# Patient Record
Sex: Male | Born: 1970 | Race: Black or African American | Hispanic: No | Marital: Single | Smoking: Former smoker
Health system: Southern US, Community
[De-identification: ages and names within clinical notes are randomized; demographics above are authoritative.]

## PROBLEM LIST (undated history)

## (undated) DIAGNOSIS — F32A Depression, unspecified: Secondary | ICD-10-CM

## (undated) DIAGNOSIS — F419 Anxiety disorder, unspecified: Secondary | ICD-10-CM

## (undated) DIAGNOSIS — E119 Type 2 diabetes mellitus without complications: Secondary | ICD-10-CM

## (undated) HISTORY — DX: Depression, unspecified: F32.A

## (undated) HISTORY — DX: Anxiety disorder, unspecified: F41.9

---

## 2000-04-21 ENCOUNTER — Emergency Department (HOSPITAL_COMMUNITY): Admission: EM | Admit: 2000-04-21 | Discharge: 2000-04-21 | Payer: Self-pay | Admitting: Emergency Medicine

## 2003-07-15 ENCOUNTER — Emergency Department (HOSPITAL_COMMUNITY): Admission: AD | Admit: 2003-07-15 | Discharge: 2003-07-15 | Payer: Self-pay | Admitting: Family Medicine

## 2018-01-25 ENCOUNTER — Encounter (HOSPITAL_BASED_OUTPATIENT_CLINIC_OR_DEPARTMENT_OTHER): Payer: Self-pay

## 2018-01-25 ENCOUNTER — Other Ambulatory Visit: Payer: Self-pay

## 2018-01-25 ENCOUNTER — Emergency Department (HOSPITAL_BASED_OUTPATIENT_CLINIC_OR_DEPARTMENT_OTHER)
Admission: EM | Admit: 2018-01-25 | Discharge: 2018-01-25 | Disposition: A | Discharge status: Home / Self Care | Payer: BLUE CROSS/BLUE SHIELD | Attending: Emergency Medicine | Admitting: Emergency Medicine

## 2018-01-25 DIAGNOSIS — E1165 Type 2 diabetes mellitus with hyperglycemia: Secondary | ICD-10-CM | POA: Insufficient documentation

## 2018-01-25 DIAGNOSIS — H538 Other visual disturbances: Secondary | ICD-10-CM | POA: Diagnosis not present

## 2018-01-25 DIAGNOSIS — F17200 Nicotine dependence, unspecified, uncomplicated: Secondary | ICD-10-CM | POA: Insufficient documentation

## 2018-01-25 DIAGNOSIS — R5383 Other fatigue: Secondary | ICD-10-CM | POA: Diagnosis not present

## 2018-01-25 DIAGNOSIS — R739 Hyperglycemia, unspecified: Secondary | ICD-10-CM

## 2018-01-25 DIAGNOSIS — I517 Cardiomegaly: Secondary | ICD-10-CM | POA: Diagnosis not present

## 2018-01-25 HISTORY — DX: Type 2 diabetes mellitus without complications: E11.9

## 2018-01-25 LAB — CBC WITH DIFFERENTIAL/PLATELET
BASOS PCT: 0 %
Basophils Absolute: 0.1 10*3/uL (ref 0.0–0.1)
EOS ABS: 0.6 10*3/uL (ref 0.0–0.7)
EOS PCT: 5 %
HCT: 40.3 % (ref 39.0–52.0)
Hemoglobin: 15.3 g/dL (ref 13.0–17.0)
LYMPHS ABS: 4 10*3/uL (ref 0.7–4.0)
Lymphocytes Relative: 34 %
MCH: 31.7 pg (ref 26.0–34.0)
MCHC: 38 g/dL — ABNORMAL HIGH (ref 30.0–36.0)
MCV: 83.6 fL (ref 78.0–100.0)
Monocytes Absolute: 1 10*3/uL (ref 0.1–1.0)
Monocytes Relative: 9 %
Neutro Abs: 6.1 10*3/uL (ref 1.7–7.7)
Neutrophils Relative %: 52 %
PLATELETS: 303 10*3/uL (ref 150–400)
RBC: 4.82 MIL/uL (ref 4.22–5.81)
RDW: 11.9 % (ref 11.5–15.5)
WBC: 11.7 10*3/uL — AB (ref 4.0–10.5)

## 2018-01-25 LAB — COMPREHENSIVE METABOLIC PANEL
ALT: 47 U/L (ref 17–63)
ANION GAP: 11 (ref 5–15)
AST: 28 U/L (ref 15–41)
Albumin: 4.4 g/dL (ref 3.5–5.0)
Alkaline Phosphatase: 149 U/L — ABNORMAL HIGH (ref 38–126)
BUN: 19 mg/dL (ref 6–20)
CHLORIDE: 92 mmol/L — AB (ref 101–111)
CO2: 23 mmol/L (ref 22–32)
Calcium: 9.2 mg/dL (ref 8.9–10.3)
Creatinine, Ser: 1.17 mg/dL (ref 0.61–1.24)
GFR calc non Af Amer: >60 mL/min (ref >60)
Glucose, Bld: 742 mg/dL (ref 65–99)
Potassium: 3.9 mmol/L (ref 3.5–5.1)
SODIUM: 126 mmol/L — AB (ref 135–145)
Total Bilirubin: 0.3 mg/dL (ref 0.3–1.2)
Total Protein: 7.7 g/dL (ref 6.5–8.1)

## 2018-01-25 LAB — CBG MONITORING, ED
GLUCOSE-CAPILLARY: 203 mg/dL — AB (ref 65–99)
Glucose-Capillary: 355 mg/dL — ABNORMAL HIGH (ref 65–99)

## 2018-01-25 MED ORDER — METFORMIN HCL 500 MG PO TABS: 500.0000 mg | ORAL_TABLET | Freq: Two times a day (BID) | ORAL | 0 refills | Status: DC

## 2018-01-25 MED ORDER — LIVING WELL WITH DIABETES BOOK
Status: AC
  Filled 2018-01-25: qty 1

## 2018-01-25 MED ORDER — SODIUM CHLORIDE 0.9 % IV BOLUS
1000.0000 mL | Freq: Once | INTRAVENOUS | Status: AC
  Administered 2018-01-25: 1000 mL via INTRAVENOUS

## 2018-01-25 MED ORDER — SODIUM CHLORIDE 0.9 % IV BOLUS: 1000.0000 mL | Freq: Once | INTRAVENOUS | Status: DC

## 2018-01-25 MED ORDER — INSULIN REGULAR HUMAN 100 UNIT/ML IJ SOLN
10.0000 [IU] | Freq: Once | INTRAMUSCULAR | Status: AC
  Administered 2018-01-25: 10 [IU] via INTRAVENOUS
  Filled 2018-01-25: qty 1

## 2018-01-25 NOTE — ED Notes (Signed)
ED Provider at bedside. 

## 2018-01-25 NOTE — ED Notes (Signed)
Date and time results received: 01/25/18 2030 (use smartphrase ".now" to insert current time)  Test: glucose Critical Value: 742  Name of Provider Notified: Dr. Jacqulyn Bath  Orders Received? Or Actions Taken?: awaiting new orders

## 2018-01-25 NOTE — ED Triage Notes (Addendum)
Pt c/o gradual vision loss for the last week, states that everything is blurry, drove self here, other complaints about chronic back pain and mouth pain, pt mentions chest pain at the end of triage

## 2018-01-25 NOTE — ED Provider Notes (Signed)
Emergency Department Provider Note   I have reviewed the triage vital signs and the nursing notes.   HISTORY  Chief Complaint Hyperglycemia   HPI Matthew Lynch is a 47 y.o. male with no known PMH presents to the emergency department for evaluation of fatigue, blurry vision, polyuria worsening over the past week.  The patient states that he feels worse when he eats sugary foods which he thought would give him energy.  He does not have a primary care physician and cannot recall the last time he saw one.  When he began to feel fatigue and blurry vision he stopped smoking.  Not take any medications.  He does have a strong family history of diabetes.  He denies any abdominal pain nausea, vomiting.   Past Medical History:  Diagnosis Date  . Diabetes mellitus without complication (HCC)     There are no active problems to display for this patient.   History reviewed. No pertinent surgical history.    Allergies Patient has no known allergies.  No family history on file.  Social History Social History   Tobacco Use  . Smoking status: Current Every Day Smoker  . Smokeless tobacco: Never Used  Substance Use Topics  . Alcohol use: Yes    Comment: occasion  . Drug use: Not on file    Review of Systems  Constitutional: No fever/chills. Positive fatigue.  Eyes: Positive blurry vision.  ENT: No sore throat. Cardiovascular: Denies chest pain. Respiratory: Denies shortness of breath. Gastrointestinal: No abdominal pain.  No nausea, no vomiting.  No diarrhea.  No constipation. Genitourinary: Negative for dysuria. Positive polyuria.  Musculoskeletal: Negative for back pain. Skin: Negative for rash. Neurological: Negative for headaches, focal weakness or numbness.  10-point ROS otherwise negative.  ____________________________________________   PHYSICAL EXAM:  VITAL SIGNS: ED Triage Vitals  Enc Vitals Group     BP 01/25/18 1847 128/78     Pulse Rate 01/25/18 1847 89      Resp 01/25/18 1847 18     Temp 01/25/18 1847 98.1 F (36.7 C)     Temp Source 01/25/18 1847 Oral     SpO2 01/25/18 1847 96 %     Weight 01/25/18 1845 125 lb (56.7 kg)     Height 01/25/18 1845  (1.651 m)     Pain Score 01/25/18 1845 0   Constitutional: Alert and oriented. Well appearing and in no acute distress. Eyes: Conjunctivae are normal. PERRL. EOMI. Head: Atraumatic. Nose: No congestion/rhinnorhea. Mouth/Throat: Mucous membranes are slightly dry. Oropharynx non-erythematous. Neck: No stridor.  Cardiovascular: Normal rate, regular rhythm. Good peripheral circulation. Grossly normal heart sounds.   Respiratory: Normal respiratory effort.  No retractions. Lungs CTAB. Gastrointestinal: Soft and nontender. No distention.  Musculoskeletal: No lower extremity tenderness nor edema. No gross deformities of extremities. Neurologic:  Normal speech and language. No gross focal neurologic deficits are appreciated.  Skin:  Skin is warm, dry and intact. No rash noted.  ____________________________________________   LABS (all labs ordered are listed, but only abnormal results are displayed)  Labs Reviewed  CBC WITH DIFFERENTIAL/PLATELET - Abnormal; Notable for the following components:      Result Value   WBC 11.7 (*)    MCHC 38.0 (*)    All other components within normal limits  COMPREHENSIVE METABOLIC PANEL - Abnormal; Notable for the following components:   Sodium 126 (*)    Chloride 92 (*)    Glucose, Bld 742 (*)    Alkaline Phosphatase 149 (*)  All other components within normal limits  CBG MONITORING, ED - Abnormal; Notable for the following components:   Glucose-Capillary >600 (*)    All other components within normal limits  CBG MONITORING, ED - Abnormal; Notable for the following components:   Glucose-Capillary 355 (*)    All other components within normal limits  CBG MONITORING, ED - Abnormal; Notable for the following components:   Glucose-Capillary 203 (*)      All other components within normal limits  CBG MONITORING, ED   ____________________________________________  EKG   EKG Interpretation  Date/Time:  Wednesday Jan 25 2018 18:52:35 EDT Ventricular Rate:  85 PR Interval:  166 QRS Duration: 96 QT Interval:  354 QTC Calculation: 421 R Axis:   77 Text Interpretation:  Normal sinus rhythm Right atrial enlargement RSR' or QR pattern in V1 suggests right ventricular conduction delay Borderline ECG No STEMI.  Confirmed by Alona Bene [phone]) on 01/25/2018 7:02:14 PM       ____________________________________________  RADIOLOGY  None ____________________________________________   PROCEDURES  Procedure(s) performed:   Procedures  None ____________________________________________   INITIAL IMPRESSION / ASSESSMENT AND PLAN / ED COURSE  Pertinent labs & imaging results that were available during my care of the patient were reviewed by me and considered in my medical decision making (see chart for details).  Patient presents to the emergency department for evaluation of gradual, bilateral blurry vision with weakness, and polyuria.  The patient's CBG here is greater than 600.  Lower suspicion for DKA but plan for labs, IV fluids and reassess.   No DKA on labs. Clinically no hyperosmolar syndrome. Patient observed in the ED with blood sugar decreasing significantly with insulin and IVF. I was able to schedule a family medicine appointment for the patient at 1:50 PM in one week. Renal function ok. Starting Metformin and discussed dietary changes. No focal neuro deficits. Patient agrees to see PCP as scheduled. Discussed short and long term risks if DM goes un managed. Discussed signs/symptoms of DKA that would require immediate return to the ED.   At this time, I do not feel there is any life-threatening condition present. I have reviewed and discussed all results (EKG, imaging, lab, urine as appropriate), exam findings with patient.  I have reviewed nursing notes and appropriate previous records.  I feel the patient is safe to be discharged home without further emergent workup. Discussed usual and customary return precautions. Patient and family (if present) verbalize understanding and are comfortable with this plan.  Patient will follow-up with their primary care provider. If they do not have a primary care provider, information for follow-up has been provided to them. All questions have been answered.    ____________________________________________  FINAL CLINICAL IMPRESSION(S) / ED DIAGNOSES  Final diagnoses:  Hyperglycemia  Blurry vision, bilateral     MEDICATIONS GIVEN DURING THIS VISIT:  Medications  sodium chloride 0.9 % bolus 1,000 mL (0 mLs Intravenous Stopped 01/25/18 2040)  sodium chloride 0.9 % bolus 1,000 mL (0 mLs Intravenous Stopped 01/25/18 2127)  insulin regular (NOVOLIN R,HUMULIN R) 100 units/mL injection 10 Units (10 Units Intravenous Given 01/25/18 2054)     NEW OUTPATIENT MEDICATIONS STARTED DURING THIS VISIT:  Discharge Medication List as of 01/25/2018 10:38 PM    START taking these medications   Details  metFORMIN (GLUCOPHAGE) 500 MG tablet Take 1 tablet (500 mg total) by mouth 2 (two) times daily with a meal., Starting Wed 01/25/2018, Until Fri 02/24/2018, Print  Note:  This document was prepared using Dragon voice recognition software and may include unintentional dictation errors.  Alona Bene, MD Emergency Medicine    Long, Arlyss Repress, MD 01/26/18 [phone]

## 2018-01-25 NOTE — Discharge Instructions (Signed)
You were seen in the ED today with blurry vision and were found to have diabetes. We are starting you on Metformin for this and have arranged for you to follow up as listed below. Return to the ED with any worsening vision, confusion, abdominal pain, or vomiting.

## 2018-01-25 NOTE — ED Notes (Signed)
Pt given PB crackers for protein before discharge

## 2018-02-01 ENCOUNTER — Ambulatory Visit (INDEPENDENT_AMBULATORY_CARE_PROVIDER_SITE_OTHER): Payer: BLUE CROSS/BLUE SHIELD | Admitting: Internal Medicine

## 2018-02-01 ENCOUNTER — Encounter: Payer: Self-pay | Admitting: Internal Medicine

## 2018-02-01 ENCOUNTER — Other Ambulatory Visit: Payer: Self-pay

## 2018-02-01 VITALS — BP 110/72 | HR 77 | Temp 98.2°F | Ht 65.0 in | Wt 132.0 lb

## 2018-02-01 DIAGNOSIS — Z7689 Persons encountering health services in other specified circumstances: Secondary | ICD-10-CM

## 2018-02-01 DIAGNOSIS — E119 Type 2 diabetes mellitus without complications: Secondary | ICD-10-CM

## 2018-02-01 DIAGNOSIS — R7309 Other abnormal glucose: Secondary | ICD-10-CM | POA: Diagnosis not present

## 2018-02-01 LAB — POCT GLYCOSYLATED HEMOGLOBIN (HGB A1C): HbA1c, POC (controlled diabetic range): 12.5 % — AB (ref 0.0–7.0)

## 2018-02-01 MED ORDER — MICROLET NEXT LANCING DEVICE MISC: 1.0000 | Freq: Two times a day (BID) | 0 refills | Status: DC

## 2018-02-01 MED ORDER — DULAGLUTIDE 0.75 MG/0.5ML ~~LOC~~ SOAJ: 0.7500 mg | SUBCUTANEOUS | 0 refills | Status: DC

## 2018-02-01 MED ORDER — MICROLET LANCETS MISC: 0 refills | Status: DC

## 2018-02-01 MED ORDER — METFORMIN HCL 1000 MG PO TABS: 1000.0000 mg | ORAL_TABLET | Freq: Two times a day (BID) | ORAL | 0 refills | Status: DC

## 2018-02-01 MED ORDER — BAYER CONTOUR MONITOR W/DEVICE KIT: PACK | 0 refills | Status: DC

## 2018-02-01 MED ORDER — GLUCOSE BLOOD VI STRP: ORAL_STRIP | 0 refills | Status: DC

## 2018-02-01 NOTE — Assessment & Plan Note (Addendum)
Poorly controlled based on Hb A1c = 12.5 today and ongoing fatigue, mild polyuria.  - Increased dose of metformin from 500 mg BID to 1000 mg BID - Started Trulicity 0.75 mg weekly given patient's very strong aversion to needles and daily injection of insulin - Ordered gluocometer, test strips, and lancets. Recommended checking blood sugar every morning; also recommended checking blood sugar before and after some meals to better understand how different foods impact blood glucose levels - Counseled on dietary changes that will help improve glycemic control, including reducing rice/bread/pasta intake and cutting out sugary beverages - Educated patient on what Hb A1c is and explained why long-term goal for Hb A1c < 7.0 will help protect him against complications of diabetes - Diabetic foot exam performed today and was normal - Placed ophthalmology referral and explained importance of having eye examination soon - Follow-up in 4 weeks. Will plan to increase Trulicity dose at that visit.

## 2018-02-01 NOTE — Patient Instructions (Addendum)
Thank you for coming to see Matthew Lynch today.  We started you on Trulicity, the medication that you will inject 1x per week. We sent this to your pharmacy. We are also increasing your metformin dose from 500 mg 2x per day to 1000 mg 2x per day. This should actually help with the constipation that you've been experiencing.  In addition we referred you for an eye doctor appointment given the vision changes that you have noticed.  Please schedule an appointment in 1 month to check in about how things are going and how you are doing with the new medication.  If you notice changes in your mental status (very confused, not acting like yourself) or start to have unexplained nausea/vomiting, please go to the emergency department because this may mean that your blood sugar is too high.

## 2018-02-01 NOTE — Progress Notes (Addendum)
Date of Visit: 02/01/2018   HPI:   [name] is a 47 y.o. here today for a new patient appointment to discuss his recently diagnosed type 2 diabetes mellitus. He is accompanied by his mom.  Type 2 Diabetes Mellitus - Seen in ED on 01/25/18 for fatigue, blurry vision, and polydipsia/polyuria; on admission blood glucose was 742 mg/dL - Notes that he has been feeling "really weak" after meals for the past 1.5 months; says that he feels like he wants to lie down for a nap after eating - Has also had frequent episodes of lightheadedness over past 1.5 months - For the past 1-2 years, notes that he feels weaker/like he is "losing strength" - Around 5/25, noted that his vision became very blurry to the point where he could only see 1-2 feet in front of him; at this point, he stopped smoking and made other lifestyle changes because the vision loss scared him; ultimately ended up going to the ED due to lack of resolution, and diagnosis of hyperglycemia 2/2 diabetes mellitus was made - Started on metformin 500 mg BID by ED physician; has been taking medication consistently since then and tolerating it well - Checking sugars at least 1x (usually 2-3x) per day since ED visit; morning numbers have been running 250-400. Patient has been using his mom's blood glucometer, test strips, and lancets. - Vision has improved significantly since ED visit but still endorses some blurriness of objects at a distance - Has had paresthesias extending from dorsal surface of toes 2-4 to mid foot on left side for last 2 years - Prior to ED visit was urinating every 2 hours and getting up 5+ times during night to urinate; now wakes up about 2x per night to urinate - Has previously eaten diet heavy in rice, pasta, and bread; has been more mindful of choosing foods lower in carbohydrates by looking at nutritional labels - Likes sweet beverages like [place] fruit punch and orange juice and drinks these beverages regularly -  Main form of physical activity is chasing around kids - Sister diagnosed with type 1 diabetes mellitus as adolescent   Dental infection - On a 7-day course of amoxicillin QID since 01/26/18 for tooth infection by dentist, Dr. Rhodia AlbrightJon Byrd (Phone: [phone]) - Dentist has recommended tooth extraction but uncomfortable completing this procedure without medical clearance due to patient's high blood sugar  ROS: See HPI.  PMH: none  PSH: none  Family History: none  Allergies: none  Medications: None  PHYSICAL EXAM: BP 110/72   Pulse 77   Temp 98.2 F (36.8 C) (Oral)   Ht 5\' 5"  (1.651 m)   Wt 132 lb (59.9 kg)   SpO2 99%   BMI 21.97 kg/m  Gen: Well appearing, sitting comfortably in chair, in no acute distress Neuro: alert and oriented to person, place, and time; sensation to touch and monofilament intact in feet bilaterally Ext: atraumatic, without cyanosis or edema  ASSESSMENT/PLAN:  Diabetes mellitus (HCC) Poorly controlled based on Hb A1c = 12.5 today and ongoing fatigue, mild polyuria.  - Increased dose of metformin from 500 mg BID to 1000 mg BID - Started Trulicity 0.75 mg weekly given patient's very strong aversion to needles and daily injection of insulin - Ordered gluocometer, test strips, and lancets. Recommended checking blood sugar every morning; also recommended checking blood sugar before and after some meals to better understand how different foods impact blood glucose levels - Counseled on dietary changes that will help improve glycemic control,  including reducing rice/bread/pasta intake and cutting out sugary beverages - Educated patient on what Hb A1c is and explained why long-term goal for Hb A1c < 7.0 will help protect him against complications of diabetes - Diabetic foot exam performed today and was normal - Placed ophthalmology referral and explained importance of having eye examination soon - Follow-up in 4 weeks. Will plan to increase Trulicity dose at that  visit.  Tooth infection - Called dentist office and asked them to fax medical clearance form for patient; will complete this once received  Health maintenance:  - Outstanding health maintenance items include:  - Pneumococcal polysaccharide vaccine  - Urine microalbumin  - HIV screening  - Tetanus/Tdap  FOLLOW UP: Follow up in 4 weeks to check in about diabetes.  Gracy Bruins, MS3 [facility]  I personally evaluated this patient along with the student, and verified all aspects of the history, physical exam, and medical decision making as documented by the student. I agree with the student's documentation and have made all necessary edits.  Willadean Carol, MD PGY-3

## 2018-02-01 NOTE — Progress Notes (Deleted)
Retta DionesJon B Byrd, DDS  [phone]  Tooth extraction clearance

## 2018-02-03 ENCOUNTER — Other Ambulatory Visit: Payer: Self-pay | Admitting: Internal Medicine

## 2018-02-03 ENCOUNTER — Telehealth: Payer: Self-pay | Admitting: *Deleted

## 2018-02-03 ENCOUNTER — Encounter: Payer: Self-pay | Admitting: Internal Medicine

## 2018-02-03 MED ORDER — ONETOUCH DELICA LANCING DEV MISC: 0 refills | Status: DC

## 2018-02-03 MED ORDER — ONETOUCH VERIO W/DEVICE KIT: 1.0000 | PACK | Freq: Every day | 0 refills | Status: DC

## 2018-02-03 MED ORDER — GLUCOSE BLOOD VI STRP: ORAL_STRIP | 12 refills | Status: DC

## 2018-02-03 MED ORDER — ONETOUCH DELICA LANCETS 33G MISC: 0 refills | Status: DC

## 2018-02-03 NOTE — Telephone Encounter (Signed)
New prescriptions sent.

## 2018-02-03 NOTE — Addendum Note (Signed)
Addended by: Willadean CarolMAYO, Caila Cirelli D on: 02/03/2018 10:43 AM   Modules accepted: Level of Service

## 2018-02-03 NOTE — Telephone Encounter (Signed)
Pharmacy faxed a request for either the onetouch or freestyle meter for this patient.  The contour was not on his formulary.  Jazmin Hartsell,CMA

## 2018-02-07 ENCOUNTER — Telehealth: Payer: Self-pay

## 2018-02-08 NOTE — Telephone Encounter (Signed)
Patient left message about glucometer and supplies needing to be changed. Spoke with pharmacist and correct prescriptions sent but [employer] has a copay. Both options same price. Spoke to patient and explained. He will pick up the meter and supplies. Ples SpecterAlisa Brake, RN [facility])

## 2018-02-09 ENCOUNTER — Telehealth: Payer: Self-pay

## 2018-02-09 NOTE — Telephone Encounter (Signed)
Matthew JoinerRebecca With Dr Lubertha BasqueJohn Byrd (dental office) Calling to get status of medical clearance form faxed on 06/06. Please call [phone]. Sunday SpillersSharon T Reni Hausner, CMA

## 2018-02-10 NOTE — Telephone Encounter (Signed)
Patient needs an appointment for medical clearance for dental form. Please have patient schedule with Dr. Nancy MarusMayo.   Marcy Sirenatherine Elgie Maziarz, D.O. 02/10/2018, 11:17 AM PGY-3, [facility]

## 2018-02-13 NOTE — Telephone Encounter (Signed)
Tried to call pt. Mailbox is full. Could not leave a message. If pt calls, please schedule him an appt for medical clearance for dental form. Sunday SpillersSharon T Saunders, CMA

## 2018-06-16 ENCOUNTER — Other Ambulatory Visit: Payer: Self-pay

## 2018-06-16 MED ORDER — METFORMIN HCL 1000 MG PO TABS: 1000.0000 mg | ORAL_TABLET | Freq: Two times a day (BID) | ORAL | 0 refills | Status: DC

## 2018-06-26 ENCOUNTER — Ambulatory Visit (INDEPENDENT_AMBULATORY_CARE_PROVIDER_SITE_OTHER): Payer: BLUE CROSS/BLUE SHIELD | Admitting: Family Medicine

## 2018-06-26 ENCOUNTER — Other Ambulatory Visit: Payer: Self-pay

## 2018-06-26 ENCOUNTER — Encounter: Payer: Self-pay | Admitting: Family Medicine

## 2018-06-26 VITALS — BP 110/64 | HR 96 | Temp 98.4°F | Ht 65.0 in | Wt 130.0 lb

## 2018-06-26 DIAGNOSIS — E1169 Type 2 diabetes mellitus with other specified complication: Secondary | ICD-10-CM

## 2018-06-26 LAB — GLUCOSE, POCT (MANUAL RESULT ENTRY): POC GLUCOSE: 120 mg/dL — AB (ref 70–99)

## 2018-06-26 LAB — POCT GLYCOSYLATED HEMOGLOBIN (HGB A1C): HBA1C, POC (CONTROLLED DIABETIC RANGE): 6.5 % (ref 0.0–7.0)

## 2018-06-26 MED ORDER — METFORMIN HCL 1000 MG PO TABS: 1000.0000 mg | ORAL_TABLET | Freq: Two times a day (BID) | ORAL | 0 refills | Status: DC

## 2018-06-26 NOTE — Progress Notes (Signed)
   Subjective:    Patient ID: Matthew Lynch, male    DOB: 1971-06-10, 47 y.o.   MRN: [id number]   CC: Type 2 diabetes follow up   HPI: Patient is a 47 yo male with a past medical history significant for T2DM who present today to follow up on diabetes management. Patient was diagnosed in June 2019 with a A1c of 12.5. Patient was then started on Metformin 2000 mg a day and Trulicity 0.75 mg. Patient reports he never started Trulicity and was taking metformin 2000 mg until two weeks ago when he ran out and stopped taking it. He reports that his fasting BG have been ranging in the 190-200's. Patient denies any polyuria or polydipsia. He has been making some dietary changes and has cut down on sweets.   Smoking status reviewed   ROS: all other systems were reviewed and are negative other than in the HPI   Past Medical History:  Diagnosis Date  . Diabetes mellitus without complication (HCC)     History reviewed. No pertinent surgical history.  Past medical history, surgical, family, and social history reviewed and updated in the EMR as appropriate.  Objective:  BP 110/64   Pulse 96   Temp 98.4 F (36.9 C) (Oral)   Ht 5\' 5"  (1.651 m)   Wt 130 lb (59 kg)   SpO2 99%   BMI 21.63 kg/m   Vitals and nursing note reviewed  General: NAD, pleasant, able to participate in exam Cardiac: RRR, normal heart sounds, no murmurs. 2+ radial and PT pulses bilaterally Respiratory: CTAB, normal effort, No wheezes, rales or rhonchi Abdomen: soft, nontender, nondistended, no hepatic or splenomegaly, +BS Extremities: no edema or cyanosis. WWP. Skin: warm and dry, no rashes noted Neuro: alert and oriented x4, no focal deficits Psych: Normal affect and mood   Assessment & Plan:   Diabetes mellitus (HCC) A1c today is 6.5 down from 12.5 back in June 2019. Patient has only been adherent to metformin and did not take trulicity. Given significant improvement in glycemic control, will encourage patient to  continue with Metformin 2000 mg daily and discontinue trulicity. I will refer him to Diabetic Education and have him come back and see me in 2 months for A1c Check. He will continue to work on diet and exercise.     Lovena Neighbours, MD [facility] PGY-3

## 2018-06-26 NOTE — Patient Instructions (Signed)
It was great seeing you today! We have addressed the following issues today  1. Continue with Metformin 1000 mg two times a day. You can stop the injectable medication. Check your blood glucose in the morning before eating. Your goal blood glucose should be around 120. 2. I referred to Diabetic education, I think you will benefit from that session greatly. 3. Make an appointment to see me in two months for another A1c check and additional blood work.  If we did any lab work today, and the results require attention, either me or my nurse will get in touch with you. If everything is normal, you will get a letter in mail and a message via . If you don't hear from [place] in two weeks, please give [place] a call. Otherwise, we look forward to seeing you again at your next visit. If you have any questions or concerns before then, please call the clinic at [phone].  Please bring all your medications to every doctors visit  Sign up for My Chart to have easy access to your labs results, and communication with your Primary care physician. Please ask Front Desk for some assistance.   Please check-out at the front desk before leaving the clinic.    Take Care,   Dr. Sydnee Cabal

## 2018-06-26 NOTE — Assessment & Plan Note (Addendum)
A1c today is 6.5 down from 12.5 back in June 2019. Patient has only been adherent to metformin and did not take trulicity. Given significant improvement in glycemic control, will encourage patient to continue with Metformin 2000 mg daily and discontinue trulicity. I will refer him to Diabetic Education and have him come back and see me in 2 months for A1c Check. He will continue to work on diet and exercise. 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

## 2018-08-24 ENCOUNTER — Other Ambulatory Visit: Payer: Self-pay

## 2018-08-24 ENCOUNTER — Ambulatory Visit (INDEPENDENT_AMBULATORY_CARE_PROVIDER_SITE_OTHER): Payer: BLUE CROSS/BLUE SHIELD | Admitting: Family Medicine

## 2018-08-24 ENCOUNTER — Encounter: Payer: Self-pay | Admitting: Family Medicine

## 2018-08-24 VITALS — BP 112/58 | HR 97 | Temp 98.7°F | Ht 65.0 in | Wt 132.0 lb

## 2018-08-24 DIAGNOSIS — M545 Low back pain: Secondary | ICD-10-CM | POA: Diagnosis not present

## 2018-08-24 DIAGNOSIS — G8929 Other chronic pain: Secondary | ICD-10-CM | POA: Diagnosis not present

## 2018-08-24 DIAGNOSIS — E1169 Type 2 diabetes mellitus with other specified complication: Secondary | ICD-10-CM | POA: Diagnosis not present

## 2018-08-24 LAB — POCT GLYCOSYLATED HEMOGLOBIN (HGB A1C): HbA1c, POC (controlled diabetic range): 6.6 % (ref 0.0–7.0)

## 2018-08-24 MED ORDER — IBUPROFEN 600 MG PO TABS: 600.0000 mg | ORAL_TABLET | Freq: Four times a day (QID) | ORAL | 0 refills | Status: DC | PRN

## 2018-08-24 NOTE — Assessment & Plan Note (Signed)
Low back appears to be chronic in nature and related to his job at [name]. Exam is unremarkable, no red flags such as radiculopathy or prior trauma or injury. Likely muscle strain. Will prescribe ibuprofen to be use on prn basis in combination with heat.

## 2018-08-24 NOTE — Patient Instructions (Signed)
It was great seeing you today! We have addressed the following issues today  1. Decrease your metformin to 500 mg two times a day and continue working on your diet and exercise plan.  2. I will check your cholesterol and kidney function. 3. You can take some ibuprofen for your low back pain in addition to heat.  4. Make an appointment to see me in two months.    If we did any lab work today, and the results require attention, either me or my nurse will get in touch with you. If everything is normal, you will get a letter in mail and a message via . If you don't hear from us in two weeks, please give us a call. Otherwise, we look forward to seeing you again at your next visit. If you have any questions or concerns before then, please call the clinic at [phone].  Please bring all your medications to every doctors visit  Sign up for My Chart to have easy access to your labs results, and communication with your Primary care physician. Please ask Front Desk for some assistance.   Please check-out at the front desk before leaving the clinic.    Take Care,   Dr. Sydnee Cabaliallo

## 2018-08-24 NOTE — Progress Notes (Addendum)
   Subjective:    Patient ID: Matthew Lynch, male    DOB: [birth year], 47 y.o.   MRN: [id number]   CC: Follow up for diabetes   HPI: Patient is a 47 yo male who presents today to follow up on diabetes management. Patient reports that he has not been taking his metformin as discussed at last office visit. He states he does not want his body to be dependent on the medication. He has change his diet and has been eating smaller meals and continue to be active with his work at a [name]. He denies any polyuria and polydipsia. Patient reports intermittent lower back pain for which he uses heat but has not taken any medication for it. He is otherwise feeling well with no acute complaints today. He currently denies any chest pain, SOB, abdominal pain, nausea, vomiting, headaches  Smoking status reviewed   ROS: all other systems were reviewed and are negative other than in the HPI   Past Medical History:  Diagnosis Date  . Diabetes mellitus without complication (HCC)     History reviewed. No pertinent surgical history.  Past medical history, surgical, family, and social history reviewed and updated in the EMR as appropriate.  Objective:  BP (!) 112/58   Pulse 97   Temp 98.7 F (37.1 C) (Oral)   Ht 5\' 5"  (1.651 m)   Wt 132 lb (59.9 kg)   SpO2 98%   BMI 21.97 kg/m   Vitals and nursing note reviewed  General: NAD, pleasant, able to participate in exam Cardiac: RRR, normal heart sounds, no murmurs. 2+ radial and PT pulses bilaterally Respiratory: CTAB, normal effort, No wheezes, rales or rhonchi Abdomen: soft, nontender, nondistended, no hepatic or splenomegaly, +BS Extremities: no edema or cyanosis. WWP. Skin: warm and dry, no rashes noted Neuro: alert and oriented x4, no focal deficits Psych: Normal affect and mood   Assessment & Plan:   Diabetes mellitus (HCC) A1c today is 6.6 despite poor adherence to regimen. Patient had an A1c of 6.5 after his diagnosis back in May  2019 when his A1c was then 12.5. Given maitenance in glycemic control, patient's age, comorbidities and reluctance to take medications, decrease dose from 1000 mg bid to 500 mg bid. Patient will follow up in 2 months if still around prediabetic range could adjust meds further. --Order BMP, lipid panel  Chronic bilateral low back pain without sciatica Low back appears to be chronic in nature and related to his job at [name]. Exam is unremarkable, no red flags such as radiculopathy or prior trauma or injury. Likely muscle strain. Will prescribe ibuprofen to be use on prn basis in combination with heat.     Matthew NeighboursAbdoulaye Cydnie Deason, MD [facility] PGY-3

## 2018-08-24 NOTE — Assessment & Plan Note (Addendum)
A1c today is 6.6 despite poor adherence to regimen. Patient had an A1c of 6.5 after his diagnosis back in May 2019 when his A1c was then 12.5. Given maitenance in glycemic control, patient's age, comorbidities and reluctance to take medications, decrease dose from 1000 mg bid to 500 mg bid. Patient will follow up in 2 months if still around prediabetic range could adjust meds further. --Order BMP, lipid panel

## 2018-08-25 LAB — BASIC METABOLIC PANEL
BUN / CREAT RATIO: 14 (ref 9–20)
BUN: 13 mg/dL (ref 6–24)
CO2: 24 mmol/L (ref 20–29)
CREATININE: 0.93 mg/dL (ref 0.76–1.27)
Calcium: 10.4 mg/dL — ABNORMAL HIGH (ref 8.7–10.2)
Chloride: 99 mmol/L (ref 96–106)
GFR, EST AFRICAN AMERICAN: 114 mL/min/{1.73_m2} (ref >59)
GFR, EST NON AFRICAN AMERICAN: 99 mL/min/{1.73_m2} (ref >59)
Glucose: 140 mg/dL — ABNORMAL HIGH (ref 65–99)
Potassium: 4.9 mmol/L (ref 3.5–5.2)
SODIUM: 139 mmol/L (ref 134–144)

## 2018-08-25 LAB — LIPID PANEL
CHOLESTEROL TOTAL: 208 mg/dL — AB (ref 100–199)
Chol/HDL Ratio: 5 ratio (ref 0.0–5.0)
HDL: 42 mg/dL (ref >39)
LDL Calculated: 132 mg/dL — ABNORMAL HIGH (ref 0–99)
Triglycerides: 171 mg/dL — ABNORMAL HIGH (ref 0–149)
VLDL Cholesterol Cal: 34 mg/dL (ref 5–40)

## 2018-10-26 ENCOUNTER — Ambulatory Visit: Payer: BLUE CROSS/BLUE SHIELD | Admitting: Family Medicine

## 2019-01-25 ENCOUNTER — Other Ambulatory Visit: Payer: Self-pay

## 2019-01-25 ENCOUNTER — Ambulatory Visit: Payer: BLUE CROSS/BLUE SHIELD | Admitting: Family Medicine

## 2019-01-25 ENCOUNTER — Encounter: Payer: Self-pay | Admitting: Family Medicine

## 2019-01-25 VITALS — BP 100/60 | HR 97 | Ht 65.0 in | Wt 124.1 lb

## 2019-01-25 DIAGNOSIS — E785 Hyperlipidemia, unspecified: Secondary | ICD-10-CM | POA: Diagnosis not present

## 2019-01-25 DIAGNOSIS — E1169 Type 2 diabetes mellitus with other specified complication: Secondary | ICD-10-CM | POA: Diagnosis not present

## 2019-01-25 LAB — POCT GLYCOSYLATED HEMOGLOBIN (HGB A1C): HbA1c, POC (controlled diabetic range): 9.7 % — AB (ref 0.0–7.0)

## 2019-01-25 MED ORDER — ROSUVASTATIN CALCIUM 20 MG PO TABS: 20.0000 mg | ORAL_TABLET | Freq: Every day | ORAL | 3 refills | Status: DC

## 2019-01-25 NOTE — Assessment & Plan Note (Signed)
Patient is diabetic and was not previously on any statin therapy.  Lipid panel from last office visit showed a total cholesterol 208, HDL 42, LDL 138.  We will start patient on Crestor 20 mg daily.

## 2019-01-25 NOTE — Progress Notes (Signed)
   Subjective:    Patient ID: Matthew Lynch, male    DOB: 03-31-1971, 48 y.o.   MRN: [id number]   CC: Type 2 diabetes follow-up  HPI: Patient is a 48 year old male who past medical history significant for hyperlipidemia, type 2 diabetes who presents today following a recent type 2 diabetes management.  Patient reports that he has been taking his metformin as needed and has not been consistent at all.  Fasting blood glucose in the morning has been ranging between 140 and 170.  Denies any polyuria polydipsia.  In the past few weeks he has been normal active and reports feeling better than he was 3 months ago.  Is otherwise asymptomatic with no acute complaint today.  He denies any chest pain, abdominal pain, shortness of breath, nausea, vomiting, headache or dizziness.  Smoking status reviewed   ROS: all other systems were reviewed and are negative other than in the HPI   Past Medical History:  Diagnosis Date  . Diabetes mellitus without complication (HCC)     History reviewed. No pertinent surgical history.  Past medical history, surgical, family, and social history reviewed and updated in the EMR as appropriate.  Objective:  BP 100/60   Pulse 97   Ht 5\' 5"  (1.651 m)   Wt 124 lb 2 oz (56.3 kg)   SpO2 99%   BMI 20.66 kg/m   Vitals and nursing note reviewed  General: NAD, pleasant, able to participate in exam Cardiac: RRR, normal heart sounds, no murmurs. 2+ radial and PT pulses bilaterally Respiratory: CTAB, normal effort, No wheezes, rales or rhonchi Abdomen: soft, nontender, nondistended, no hepatic or splenomegaly, +BS Extremities: no edema or cyanosis. WWP. Skin: warm and dry, no rashes noted Neuro: alert and oriented x4, no focal deficits Psych: Normal affect and mood   Assessment & Plan:   Diabetes mellitus (HCC) A1c today 7.7 up from 6.6.  Patient has been nonadherent to metformin.  Patient was supposed to be on 500 mg twice daily.  Worsening A1c consistent with  poor management.  Discussed need to be more consistent with therapy.  Patient will continue to check blood glucose and is aware his fasting blood glucose in the morning goal is 120 or less. --We will increase metformin to 1000 mg twice daily --Patient will consider cholesterol changes --We will start patient on statin therapy --Referral to diabetic education placed  Hyperlipidemia associated with type 2 diabetes mellitus (HCC) Patient is diabetic and was not previously on any statin therapy.  Lipid panel from last office visit showed a total cholesterol 208, HDL 42, LDL 138.  We will start patient on Crestor 20 mg daily.   Lovena Neighbours, MD [facility] PGY-3

## 2019-01-25 NOTE — Patient Instructions (Addendum)
It was great seeing you today! We have addressed the following issues today  1. Your A1c is 9.7 much worse than last time it was done a few months ago. This indicate a worsening of your Diabetes. You will need to be consistent with the medication as discussed. 2. I am starting you on a cholesterol medication named Crestor you will take it  every day. 3. I will refer you to diabetic education today.  If we did any lab work today, and the results require attention, either me or my nurse will get in touch with you. If everything is normal, you will get a letter in mail and a message via . If you don't hear from [place] in two weeks, please give [place] a call. Otherwise, we look forward to seeing you again at your next visit. If you have any questions or concerns before then, please call the clinic at [phone].  Please bring all your medications to every doctors visit  Sign up for My Chart to have easy access to your labs results, and communication with your Primary care physician. Please ask Front Desk for some assistance.   Please check-out at the front desk before leaving the clinic.    Take Care,   Dr. Sydnee Cabal

## 2019-01-25 NOTE — Assessment & Plan Note (Signed)
A1c today 7.7 up from 6.6.  Patient has been nonadherent to metformin.  Patient was supposed to be on 500 mg twice daily.  Worsening A1c consistent with poor management.  Discussed need to be more consistent with therapy.  Patient will continue to check blood glucose and is aware his fasting blood glucose in the morning goal is 120 or less. --We will increase metformin to 1000 mg twice daily --Patient will consider cholesterol changes --We will start patient on statin therapy --Referral to diabetic education placed

## 2019-03-13 ENCOUNTER — Ambulatory Visit: Payer: BLUE CROSS/BLUE SHIELD | Admitting: Registered"

## 2019-03-15 DIAGNOSIS — R197 Diarrhea, unspecified: Secondary | ICD-10-CM | POA: Diagnosis not present

## 2019-03-15 DIAGNOSIS — R109 Unspecified abdominal pain: Secondary | ICD-10-CM | POA: Diagnosis not present

## 2019-03-15 DIAGNOSIS — Z20828 Contact with and (suspected) exposure to other viral communicable diseases: Secondary | ICD-10-CM | POA: Diagnosis not present

## 2019-05-09 DIAGNOSIS — Z20828 Contact with and (suspected) exposure to other viral communicable diseases: Secondary | ICD-10-CM | POA: Diagnosis not present

## 2019-05-09 DIAGNOSIS — A084 Viral intestinal infection, unspecified: Secondary | ICD-10-CM | POA: Diagnosis not present

## 2019-08-02 ENCOUNTER — Other Ambulatory Visit: Payer: Self-pay | Admitting: *Deleted

## 2019-08-02 MED ORDER — METFORMIN HCL 1000 MG PO TABS: 1000.0000 mg | ORAL_TABLET | Freq: Two times a day (BID) | ORAL | 0 refills | Status: DC

## 2019-08-02 NOTE — Telephone Encounter (Signed)
Patient has an upcoming appt on 08/21/2019. Corbin Falck,CMA

## 2019-08-21 ENCOUNTER — Ambulatory Visit: Payer: BC Managed Care – PPO | Admitting: Family Medicine

## 2020-01-16 ENCOUNTER — Emergency Department (HOSPITAL_BASED_OUTPATIENT_CLINIC_OR_DEPARTMENT_OTHER): Payer: BC Managed Care – PPO

## 2020-01-16 ENCOUNTER — Emergency Department (HOSPITAL_BASED_OUTPATIENT_CLINIC_OR_DEPARTMENT_OTHER)
Admission: EM | Admit: 2020-01-16 | Discharge: 2020-01-16 | Disposition: A | Discharge status: Home / Self Care | Payer: BC Managed Care – PPO | Attending: Emergency Medicine | Admitting: Emergency Medicine

## 2020-01-16 ENCOUNTER — Other Ambulatory Visit: Payer: Self-pay

## 2020-01-16 ENCOUNTER — Encounter (HOSPITAL_BASED_OUTPATIENT_CLINIC_OR_DEPARTMENT_OTHER): Payer: Self-pay

## 2020-01-16 DIAGNOSIS — R112 Nausea with vomiting, unspecified: Secondary | ICD-10-CM | POA: Diagnosis not present

## 2020-01-16 DIAGNOSIS — R739 Hyperglycemia, unspecified: Secondary | ICD-10-CM

## 2020-01-16 DIAGNOSIS — R1084 Generalized abdominal pain: Secondary | ICD-10-CM | POA: Diagnosis not present

## 2020-01-16 DIAGNOSIS — E1165 Type 2 diabetes mellitus with hyperglycemia: Secondary | ICD-10-CM | POA: Diagnosis not present

## 2020-01-16 DIAGNOSIS — R197 Diarrhea, unspecified: Secondary | ICD-10-CM | POA: Diagnosis not present

## 2020-01-16 DIAGNOSIS — R109 Unspecified abdominal pain: Secondary | ICD-10-CM | POA: Diagnosis not present

## 2020-01-16 DIAGNOSIS — F17210 Nicotine dependence, cigarettes, uncomplicated: Secondary | ICD-10-CM | POA: Diagnosis not present

## 2020-01-16 DIAGNOSIS — R079 Chest pain, unspecified: Secondary | ICD-10-CM | POA: Diagnosis not present

## 2020-01-16 DIAGNOSIS — Z7984 Long term (current) use of oral hypoglycemic drugs: Secondary | ICD-10-CM | POA: Diagnosis not present

## 2020-01-16 LAB — URINALYSIS, ROUTINE W REFLEX MICROSCOPIC
Bilirubin Urine: NEGATIVE
Glucose, UA: >=500 mg/dL — AB
Hgb urine dipstick: NEGATIVE
Ketones, ur: 40 mg/dL — AB
Leukocytes,Ua: NEGATIVE
Nitrite: NEGATIVE
Protein, ur: NEGATIVE mg/dL
Specific Gravity, Urine: 1.015 (ref 1.005–1.030)
pH: 6 (ref 5.0–8.0)

## 2020-01-16 LAB — COMPREHENSIVE METABOLIC PANEL
ALT: 52 U/L — ABNORMAL HIGH (ref 0–44)
AST: 28 U/L (ref 15–41)
Albumin: 4.8 g/dL (ref 3.5–5.0)
Alkaline Phosphatase: 106 U/L (ref 38–126)
Anion gap: 12 (ref 5–15)
BUN: 18 mg/dL (ref 6–20)
CO2: 27 mmol/L (ref 22–32)
Calcium: 9.5 mg/dL (ref 8.9–10.3)
Chloride: 94 mmol/L — ABNORMAL LOW (ref 98–111)
Creatinine, Ser: 0.77 mg/dL (ref 0.61–1.24)
GFR calc Af Amer: >60 mL/min (ref >60)
GFR calc non Af Amer: >60 mL/min (ref >60)
Glucose, Bld: 365 mg/dL — ABNORMAL HIGH (ref 70–99)
Potassium: 4.4 mmol/L (ref 3.5–5.1)
Sodium: 133 mmol/L — ABNORMAL LOW (ref 135–145)
Total Bilirubin: 1.3 mg/dL — ABNORMAL HIGH (ref 0.3–1.2)
Total Protein: 8.5 g/dL — ABNORMAL HIGH (ref 6.5–8.1)

## 2020-01-16 LAB — LIPASE, BLOOD: Lipase: 33 U/L (ref 11–51)

## 2020-01-16 LAB — CBC
HCT: 49.8 % (ref 39.0–52.0)
Hemoglobin: 17.4 g/dL — ABNORMAL HIGH (ref 13.0–17.0)
MCH: 30.2 pg (ref 26.0–34.0)
MCHC: 34.9 g/dL (ref 30.0–36.0)
MCV: 86.5 fL (ref 80.0–100.0)
Platelets: 361 10*3/uL (ref 150–400)
RBC: 5.76 MIL/uL (ref 4.22–5.81)
RDW: 11.9 % (ref 11.5–15.5)
WBC: 14.9 10*3/uL — ABNORMAL HIGH (ref 4.0–10.5)
nRBC: 0 % (ref 0.0–0.2)

## 2020-01-16 LAB — URINALYSIS, MICROSCOPIC (REFLEX): WBC, UA: NONE SEEN WBC/hpf (ref 0–5)

## 2020-01-16 LAB — CBG MONITORING, ED: Glucose-Capillary: 352 mg/dL — ABNORMAL HIGH (ref 70–99)

## 2020-01-16 MED ORDER — SODIUM CHLORIDE 0.9 % IV BOLUS
1000.0000 mL | Freq: Once | INTRAVENOUS | Status: AC
  Administered 2020-01-16: 1000 mL via INTRAVENOUS

## 2020-01-16 MED ORDER — ONDANSETRON HCL 4 MG/2ML IJ SOLN
4.0000 mg | Freq: Once | INTRAMUSCULAR | Status: AC
  Administered 2020-01-16: 4 mg via INTRAVENOUS
  Filled 2020-01-16: qty 2

## 2020-01-16 MED ORDER — MORPHINE SULFATE (PF) 4 MG/ML IV SOLN
4.0000 mg | Freq: Once | INTRAVENOUS | Status: AC
  Administered 2020-01-16: 4 mg via INTRAVENOUS
  Filled 2020-01-16: qty 1

## 2020-01-16 MED ORDER — IOHEXOL 300 MG/ML  SOLN
100.0000 mL | Freq: Once | INTRAMUSCULAR | Status: AC
  Administered 2020-01-16: 75 mL via INTRAVENOUS

## 2020-01-16 MED ORDER — ONDANSETRON 4 MG PO TBDP
4.0000 mg | ORAL_TABLET | Freq: Once | ORAL | Status: AC | PRN
  Administered 2020-01-16: 4 mg via ORAL
  Filled 2020-01-16: qty 1

## 2020-01-16 MED ORDER — SODIUM CHLORIDE 0.9% FLUSH
3.0000 mL | Freq: Once | INTRAVENOUS | Status: AC
  Administered 2020-01-16: 3 mL via INTRAVENOUS
  Filled 2020-01-16: qty 3

## 2020-01-16 NOTE — ED Notes (Signed)
ED Provider at bedside. 

## 2020-01-16 NOTE — ED Provider Notes (Signed)
[place] EMERGENCY DEPARTMENT Provider Note   CSN: [id number] Arrival date & time: 01/16/20  1643     History Chief Complaint  Patient presents with  . Abdominal Pain    Matthew Lynch is a 49 y.o. male.  He is here with many complaints most of which sound chronic.  He has pain in his chest at times pain in his back numbness in his hands and feet intermittently.  Does not take his diabetes medicines.  Does not see a doctor.  More recently he says that his kids got sick couple of days ago with vomiting and diarrhea and now he is having abdominal pain vomiting and diarrhea.  Does not know if he has had a fever.  Has not seen any blood in the vomitus.  The history is provided by the patient.  Abdominal Pain Pain location:  Generalized Pain quality: cramping   Pain radiates to:  Does not radiate Pain severity:  Severe Onset quality:  Gradual Timing:  Constant Progression:  Unchanged Chronicity:  New Context: sick contacts   Relieved by:  None tried Worsened by:  Nothing Ineffective treatments:  None tried Associated symptoms: chest pain, cough, diarrhea, fatigue, nausea and vomiting   Associated symptoms: no fever, no hematemesis, no hematochezia, no hematuria and no sore throat        Past Medical History:  Diagnosis Date  . Diabetes mellitus without complication [facility])     Patient Active Problem List   Diagnosis Date Noted  . Hyperlipidemia associated with type 2 diabetes mellitus ([place]) 01/25/2019  . Chronic bilateral low back pain without sciatica 08/24/2018  . Diabetes mellitus ([place]) 02/01/2018    History reviewed. No pertinent surgical history.     No family history on file.  Social History   Tobacco Use  . Smoking status: Current Every Day Smoker    Types: Cigarettes  . Smokeless tobacco: Never Used  Substance Use Topics  . Alcohol use: Never  . Drug use: Never    Home Medications Prior to Admission medications   Medication Sig Start  Date End Date Taking? Authorizing Provider  Blood Glucose Monitoring Suppl (ONETOUCH VERIO) w/Device KIT 1 each by Does not apply route daily. 02/03/18   Mayo, Pete Pelt, MD  Dulaglutide (TRULICITY) 6.64 QI/3.4VQ SOPN Inject 0.75 mg into the skin once a week. 02/01/18   Mayo, Pete Pelt, MD  glucose blood [facility] VERIO) test strip Check blood sugar 2-3 times daily 02/03/18   Mayo, Pete Pelt, MD  ibuprofen (ADVIL,MOTRIN) 600 MG tablet Take 1 tablet (600 mg total) by mouth every 6 (six) hours as needed for mild pain or moderate pain. 08/24/18   Marjie Skiff, MD  Lancet Devices (ONE TOUCH DELICA LANCING DEV) MISC Check blood sugar 2-3 times daily 02/03/18   Mayo, Pete Pelt, MD  metFORMIN (GLUCOPHAGE) 1000 MG tablet Take 1 tablet (1,000 mg total) by mouth 2 (two) times daily with a meal. 08/02/19   [name], MD  [facility] DELICA LANCETS 25Z MISC Check blood sugar 2-3 times daily 02/03/18   Mayo, Pete Pelt, MD  rosuvastatin (CRESTOR) 20 MG tablet Take 1 tablet (20 mg total) by mouth daily. 01/25/19   Marjie Skiff, MD    Allergies    Patient has no known allergies.  Review of Systems   Review of Systems  Constitutional: Positive for fatigue. Negative for fever.  HENT: Negative for sore throat.   Eyes: Positive for visual disturbance.  Respiratory: Positive for cough.   Cardiovascular:  Positive for chest pain.  Gastrointestinal: Positive for abdominal pain, diarrhea, nausea and vomiting. Negative for hematemesis and hematochezia.  Genitourinary: Negative for hematuria.  Musculoskeletal: Positive for back pain.  Skin: Negative for rash.  Neurological: Positive for numbness.    Physical Exam Updated Vital Signs BP (!) 99/57   Pulse 98   Resp (!) 23   SpO2 98%   Physical Exam Vitals and nursing note reviewed.  Constitutional:      Appearance: Normal appearance. He is well-developed. He is not toxic-appearing.  HENT:     Head: Normocephalic and atraumatic.  Eyes:      Conjunctiva/sclera: Conjunctivae normal.  Cardiovascular:     Rate and Rhythm: Normal rate and regular rhythm.     Heart sounds: No murmur.  Pulmonary:     Effort: Pulmonary effort is normal. No respiratory distress.     Breath sounds: Normal breath sounds.  Abdominal:     Palpations: Abdomen is soft.     Tenderness: There is generalized abdominal tenderness. There is no guarding or rebound.  Musculoskeletal:        General: No deformity or signs of injury. Normal range of motion.     Cervical back: Neck supple.  Skin:    General: Skin is warm and dry.     Capillary Refill: Capillary refill takes less than 2 seconds.  Neurological:     General: No focal deficit present.     Mental Status: He is alert and oriented to person, place, and time.     ED Results / Procedures / Treatments   Labs (all labs ordered are listed, but only abnormal results are displayed) Labs Reviewed  COMPREHENSIVE METABOLIC PANEL - Abnormal; Notable for the following components:      Result Value   Sodium 133 (*)    Chloride 94 (*)    Glucose, Bld 365 (*)    Total Protein 8.5 (*)    ALT 52 (*)    Total Bilirubin 1.3 (*)    All other components within normal limits  CBC - Abnormal; Notable for the following components:   WBC 14.9 (*)    Hemoglobin 17.4 (*)    All other components within normal limits  URINALYSIS, ROUTINE W REFLEX MICROSCOPIC - Abnormal; Notable for the following components:   Glucose, UA >=500 (*)    Ketones, ur 40 (*)    All other components within normal limits  URINALYSIS, MICROSCOPIC (REFLEX) - Abnormal; Notable for the following components:   Bacteria, UA RARE (*)    All other components within normal limits  CBG MONITORING, ED - Abnormal; Notable for the following components:   Glucose-Capillary 352 (*)    All other components within normal limits  LIPASE, BLOOD    EKG EKG Interpretation  Date/Time:  Wednesday Jan 16 2020 19:09:01 EDT Ventricular Rate:  92 PR  Interval:    QRS Duration: 97 QT Interval:  339 QTC Calculation: 420 R Axis:   76 Text Interpretation: Sinus rhythm Right atrial enlargement RSR' in V1 or V2, probably normal variant Probable left ventricular hypertrophy ST elev, probable normal early repol pattern No significant change since prior 5/19 Confirmed by Aletta Edouard [phone]) on 01/16/2020 7:12:32 PM   Radiology CT Abdomen Pelvis W Contrast  Result Date: 01/16/2020 CLINICAL DATA:  Nonlocalized abdominal pain with nausea and diarrhea EXAM: CT ABDOMEN AND PELVIS WITH CONTRAST TECHNIQUE: Multidetector CT imaging of the abdomen and pelvis was performed using the standard protocol following bolus administration of intravenous contrast.  CONTRAST:  107m OMNIPAQUE IOHEXOL 300 MG/ML  SOLN COMPARISON:  None. FINDINGS: Lower chest: No acute abnormality. Hepatobiliary: No focal liver abnormality is seen. No gallstones, gallbladder wall thickening, or biliary dilatation. Pancreas: Unremarkable. No pancreatic ductal dilatation or surrounding inflammatory changes. Spleen: Normal in size without focal abnormality. Adrenals/Urinary Tract: Adrenal glands are unremarkable. Kidneys are normal, without renal calculi, focal lesion, or hydronephrosis. Bladder is unremarkable. Stomach/Bowel: Stomach is within normal limits. Appendix appears normal. No evidence of bowel wall thickening, distention, or inflammatory changes. Vascular/Lymphatic: No significant vascular findings are present. No enlarged abdominal or pelvic lymph nodes. Reproductive: Prostate is unremarkable. Other: No abdominal wall hernia or abnormality. No abdominopelvic ascites. Musculoskeletal: No acute or significant osseous findings. IMPRESSION: Negative. No CT evidence for acute intra-abdominal or pelvic abnormality Electronically Signed   By: KDonavan FoilM.D.   On: 01/16/2020 20:01   DG Chest Port 1 View  Result Date: 01/16/2020 CLINICAL DATA:  Chest and abdominal pain. Nausea and diarrhea.  Fatigue. EXAM: PORTABLE CHEST 1 VIEW COMPARISON:  None. FINDINGS: Multiple overlying monitoring devices. The cardiomediastinal contours are normal. The lungs are clear. Pulmonary vasculature is normal. No consolidation, pleural effusion, or pneumothorax. No acute osseous abnormalities are seen. IMPRESSION: Unremarkable portable AP view of the chest. Electronically Signed   By: MKeith RakeM.D.   On: 01/16/2020 19:46    Procedures Procedures (including critical care time)  Medications Ordered in ED Medications  sodium chloride flush (NS) 0.9 % injection 3 mL (has no administration in time range)  morphine 4 MG/ML injection 4 mg (has no administration in time range)  ondansetron (ZOFRAN) injection 4 mg (has no administration in time range)  sodium chloride 0.9 % bolus 1,000 mL (has no administration in time range)  ondansetron (ZOFRAN-ODT) disintegrating tablet 4 mg (4 mg Oral Given 01/16/20 1713)    ED Course  I have reviewed the triage vital signs and the nursing notes.  Pertinent labs & imaging results that were available during my care of the patient were reviewed by me and considered in my medical decision making (see chart for details).  Clinical Course as of Jan 16 901  Wed Jan 16, 2020  1948 Chest x-ray interpreted by me as no acute infiltrate or pneumothorax.   [MB]  2216 Reevaluated patient, he is much more comfortable and well-appearing.  His work-up other than showing an elevated glucose elevated white count did not really show much of anything.  Chest x-ray and CT unremarkable.  I offered to prescribe him some nausea medication and he said that he does not want any.  Recommended that he follow-up with primary care doctor for better management of his diabetes and other chronic medical issues.  Return instructions discussed   [MB]    Clinical Course User Index [MB] BHayden Rasmussen MD   MDM Rules/Calculators/A&P                     This patient complains of acute  nausea vomiting diarrhea and abdominal pain.; this involves an extensive number of treatment Options and is a complaint that carries with it a high risk of complications and Morbidity. The differential includes dehydration, infectious diarrhea, metabolic derangement, perforation, diverticulitis  I ordered, reviewed and interpreted labs, which included CBC with an elevated white count elevated blood glucose, gross signs of infection, I ordered medication IV fluidsPain and nausea medication I ordered imaging studies which included chest x-ray and CT abdomen and pelvis and I independently  visualized and interpreted imaging which showed no acute disease Previous records obtained and reviewed in epic  After the interventions stated above, I reevaluated the patient and found patient to be symptomatically improved.  I recommended that he establish care with a primary care doctor as he needs better management of his diabetes.  Return instructions discussed.   Final Clinical Impression(s) / ED Diagnoses Final diagnoses:  Generalized abdominal pain  Nausea vomiting and diarrhea  Hyperglycemia    Rx / DC Orders ED Discharge Orders    None       Hayden Rasmussen, MD 01/17/20 [phone]

## 2020-01-16 NOTE — Discharge Instructions (Addendum)
You were seen in the emergency department for nausea vomiting diarrhea abdominal pain and elevated blood sugars along with other various complaints.  You had blood work chest x-ray and a CAT scan of your abdomen and pelvis that did not show any serious abnormalities.  Will be important for you to get a primary care doctor for follow-up and management of your diabetes.  Return to the emergency department for any worsening or concerning symptoms

## 2020-01-16 NOTE — ED Notes (Signed)
Pt transported to CT ?

## 2020-01-16 NOTE — ED Triage Notes (Addendum)
Pt c/o abd pain, nausea and diarrhea started yesterday-c/o fatigue, numbness to hands and feet x "months"-pt is noncompliant DM-NAD-steady gait

## 2020-01-23 ENCOUNTER — Emergency Department (HOSPITAL_BASED_OUTPATIENT_CLINIC_OR_DEPARTMENT_OTHER)
Admission: EM | Admit: 2020-01-23 | Discharge: 2020-01-23 | Disposition: A | Discharge status: Home / Self Care | Payer: BC Managed Care – PPO | Source: Home / Self Care | Attending: Emergency Medicine | Admitting: Emergency Medicine

## 2020-01-23 ENCOUNTER — Encounter (HOSPITAL_BASED_OUTPATIENT_CLINIC_OR_DEPARTMENT_OTHER): Payer: Self-pay | Admitting: Emergency Medicine

## 2020-01-23 ENCOUNTER — Other Ambulatory Visit: Payer: Self-pay

## 2020-01-23 DIAGNOSIS — Z79899 Other long term (current) drug therapy: Secondary | ICD-10-CM | POA: Insufficient documentation

## 2020-01-23 DIAGNOSIS — E1165 Type 2 diabetes mellitus with hyperglycemia: Secondary | ICD-10-CM | POA: Insufficient documentation

## 2020-01-23 DIAGNOSIS — E46 Unspecified protein-calorie malnutrition: Secondary | ICD-10-CM | POA: Diagnosis not present

## 2020-01-23 DIAGNOSIS — E1142 Type 2 diabetes mellitus with diabetic polyneuropathy: Secondary | ICD-10-CM | POA: Diagnosis not present

## 2020-01-23 DIAGNOSIS — Z681 Body mass index (BMI) 19 or less, adult: Secondary | ICD-10-CM | POA: Diagnosis not present

## 2020-01-23 DIAGNOSIS — E1169 Type 2 diabetes mellitus with other specified complication: Secondary | ICD-10-CM | POA: Diagnosis not present

## 2020-01-23 DIAGNOSIS — R42 Dizziness and giddiness: Secondary | ICD-10-CM | POA: Diagnosis not present

## 2020-01-23 DIAGNOSIS — Z20822 Contact with and (suspected) exposure to covid-19: Secondary | ICD-10-CM | POA: Diagnosis not present

## 2020-01-23 DIAGNOSIS — F17210 Nicotine dependence, cigarettes, uncomplicated: Secondary | ICD-10-CM | POA: Insufficient documentation

## 2020-01-23 DIAGNOSIS — E785 Hyperlipidemia, unspecified: Secondary | ICD-10-CM | POA: Diagnosis not present

## 2020-01-23 DIAGNOSIS — F40231 Fear of injections and transfusions: Secondary | ICD-10-CM | POA: Diagnosis not present

## 2020-01-23 DIAGNOSIS — R739 Hyperglycemia, unspecified: Secondary | ICD-10-CM

## 2020-01-23 DIAGNOSIS — Z7984 Long term (current) use of oral hypoglycemic drugs: Secondary | ICD-10-CM | POA: Insufficient documentation

## 2020-01-23 DIAGNOSIS — Z9114 Patient's other noncompliance with medication regimen: Secondary | ICD-10-CM | POA: Insufficient documentation

## 2020-01-23 LAB — POCT I-STAT EG7
Acid-Base Excess: 0 mmol/L (ref 0.0–2.0)
Bicarbonate: 27.1 mmol/L (ref 20.0–28.0)
Calcium, Ion: 1.28 mmol/L (ref 1.15–1.40)
HCT: 42 % (ref 39.0–52.0)
Hemoglobin: 14.3 g/dL (ref 13.0–17.0)
O2 Saturation: 42 %
Patient temperature: 98.6
Potassium: 4.7 mmol/L (ref 3.5–5.1)
Sodium: 134 mmol/L — ABNORMAL LOW (ref 135–145)
TCO2: 29 mmol/L (ref 22–32)
pCO2, Ven: 52.3 mmHg (ref 44.0–60.0)
pH, Ven: 7.324 (ref 7.250–7.430)
pO2, Ven: 26 mmHg — CL (ref 32.0–45.0)

## 2020-01-23 LAB — CBC WITH DIFFERENTIAL/PLATELET
Abs Immature Granulocytes: 0.07 10*3/uL (ref 0.00–0.07)
Basophils Absolute: 0.1 10*3/uL (ref 0.0–0.1)
Basophils Relative: 1 %
Eosinophils Absolute: 0.4 10*3/uL (ref 0.0–0.5)
Eosinophils Relative: 4 %
HCT: 45.3 % (ref 39.0–52.0)
Hemoglobin: 16 g/dL (ref 13.0–17.0)
Immature Granulocytes: 1 %
Lymphocytes Relative: 31 %
Lymphs Abs: 3.1 10*3/uL (ref 0.7–4.0)
MCH: 30.2 pg (ref 26.0–34.0)
MCHC: 35.3 g/dL (ref 30.0–36.0)
MCV: 85.6 fL (ref 80.0–100.0)
Monocytes Absolute: 0.9 10*3/uL (ref 0.1–1.0)
Monocytes Relative: 9 %
Neutro Abs: 5.5 10*3/uL (ref 1.7–7.7)
Neutrophils Relative %: 54 %
Platelets: 395 10*3/uL (ref 150–400)
RBC: 5.29 MIL/uL (ref 4.22–5.81)
RDW: 11.8 % (ref 11.5–15.5)
WBC: 10 10*3/uL (ref 4.0–10.5)
nRBC: 0 % (ref 0.0–0.2)

## 2020-01-23 LAB — COMPREHENSIVE METABOLIC PANEL
ALT: 55 U/L — ABNORMAL HIGH (ref 0–44)
AST: 47 U/L — ABNORMAL HIGH (ref 15–41)
Albumin: 4.2 g/dL (ref 3.5–5.0)
Alkaline Phosphatase: 143 U/L — ABNORMAL HIGH (ref 38–126)
Anion gap: 10 (ref 5–15)
BUN: 13 mg/dL (ref 6–20)
CO2: 26 mmol/L (ref 22–32)
Calcium: 9.5 mg/dL (ref 8.9–10.3)
Chloride: 88 mmol/L — ABNORMAL LOW (ref 98–111)
Creatinine, Ser: 0.86 mg/dL (ref 0.61–1.24)
GFR calc Af Amer: >60 mL/min (ref >60)
GFR calc non Af Amer: >60 mL/min (ref >60)
Glucose, Bld: 653 mg/dL (ref 70–99)
Potassium: 4.7 mmol/L (ref 3.5–5.1)
Sodium: 124 mmol/L — ABNORMAL LOW (ref 135–145)
Total Bilirubin: 0.7 mg/dL (ref 0.3–1.2)
Total Protein: 7.8 g/dL (ref 6.5–8.1)

## 2020-01-23 LAB — CBG MONITORING, ED
Glucose-Capillary: >600 mg/dL (ref 70–99)
Glucose-Capillary: 348 mg/dL — ABNORMAL HIGH (ref 70–99)

## 2020-01-23 MED ORDER — SODIUM CHLORIDE 0.9 % IV BOLUS
2000.0000 mL | Freq: Once | INTRAVENOUS | Status: AC
  Administered 2020-01-23: 2000 mL via INTRAVENOUS

## 2020-01-23 NOTE — ED Provider Notes (Signed)
[facility] EMERGENCY DEPARTMENT Provider Note   CSN: [id number] Arrival date & time: 01/23/20  1709     History Chief Complaint  Patient presents with  . Hyperglycemia    Matthew Lynch is a 49 y.o. male.  The history is provided by the patient.  Hyperglycemia Blood sugar level PTA:  >600 Severity:  Severe Onset quality:  Gradual Timing:  Constant Progression:  Waxing and waning Chronicity:  Recurrent Diabetes status:  Controlled with oral medications Current diabetic therapy:  Metformin 1041m bid Time since last antidiabetic medication: yesterday. 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  Also he reports that he does not always take his Metformin twice a day because of side effec Relieved by:  Nothing Associated symptoms: blurred vision, dehydration, dizziness, fatigue, increased thirst, nausea, polyuria, weakness and weight change   Associated symptoms: no abdominal pain, no altered mental status, no shortness of breath, no syncope and no vomiting        Past Medical History:  Diagnosis Date  . Diabetes mellitus without complication ([facility]     Patient Active Problem List   Diagnosis Date Noted  . Hyperlipidemia associated with type 2 diabetes mellitus ([facility] 01/25/2019  . Chronic bilateral low back pain without sciatica 08/24/2018  . Diabetes mellitus ([place] 02/01/[place]    History reviewed. No pertinent surgical history.     No family history on file.  Social History   Tobacco Use  . Smoking status: Current Every Day Smoker    Types: Cigarettes  . Smokeless tobacco: Never Used  Substance Use Topics  . Alcohol use: Never  . Drug use: Never    Home Medications Prior to Admission medications   Medication Sig Start Date End Date Taking? Authorizing Provider  Blood Glucose  Monitoring Suppl (ONETOUCH VERIO) w/Device KIT 1 each by Does not apply route daily. 02/03/18   Mayo, KPete Pelt MD  Dulaglutide (TRULICITY) 03.00MPQ/3.3AQSOPN Inject 0.75 mg into the skin once a week. 02/01/18   Mayo, KPete Pelt MD  glucose blood ([facility]) test strip Check blood sugar 2-3 times daily 02/03/18   Mayo, KPete Pelt MD  ibuprofen (ADVIL,MOTRIN) 600 MG tablet Take 1 tablet (600 mg total) by mouth every 6 (six) hours as needed for mild pain or moderate pain. 08/24/18   DMarjie Skiff MD  Lancet Devices (ONE TOUCH DELICA LANCING DEV) MISC Check blood sugar 2-3 times daily 02/03/18   Mayo, KPete Pelt MD  metFORMIN (GLUCOPHAGE) 1000 MG tablet Take 1 tablet (1,000 mg total) by mouth 2 (two) times daily with a meal. 08/02/19   FMatilde Haymaker MD  [facility] 376AMISC Check blood sugar 2-3 times daily 02/03/18   Mayo, KPete Pelt MD  rosuvastatin (CRESTOR) 20 MG tablet Take 1 tablet (20 mg total) by mouth daily. 01/25/19   DMarjie Skiff MD    Allergies    Patient has no known allergies.  Review of Systems   Review of Systems  Constitutional: Positive for fatigue.  Eyes: Positive for blurred vision.  Respiratory: Negative for shortness of breath.   Cardiovascular: Negative for syncope.  Gastrointestinal: Positive for nausea. Negative for abdominal pain and vomiting.  Endocrine: Positive for polydipsia and polyuria.  Neurological: Positive for dizziness and weakness.  All other systems reviewed and are negative.  Physical Exam Updated Vital Signs BP 121/84 (BP Location: Left Arm)   Pulse 98   Temp 98.4 F (36.9 C) (Oral)   Resp 20   Ht 5' 4" (1.626 m)   Wt 45.7 kg   SpO2 98%   BMI 17.30 kg/m   Physical Exam Vitals and nursing note reviewed.  Constitutional:      General: He is not in acute distress.    Appearance: He is well-developed and underweight.  HENT:     Head: Normocephalic and atraumatic.     Mouth/Throat:     Mouth: Mucous membranes are dry.    Eyes:     Conjunctiva/sclera: Conjunctivae normal.     Pupils: Pupils are equal, round, and reactive to light.  Cardiovascular:     Rate and Rhythm: Regular rhythm. Tachycardia present.     Heart sounds: No murmur.  Pulmonary:     Effort: Pulmonary effort is normal. No respiratory distress.     Breath sounds: Normal breath sounds. No wheezing or rales.  Abdominal:     General: There is no distension.     Palpations: Abdomen is soft.     Tenderness: There is no abdominal tenderness. There is no guarding or rebound.  Musculoskeletal:        General: No tenderness. Normal range of motion.     Cervical back: Normal range of motion and neck supple.     Right lower leg: No edema.     Left lower leg: No edema.  Skin:    General: Skin is warm and dry.     Findings: No erythema or rash.  Neurological:     Mental Status: He is alert and oriented to person, place, and time.     Comments: Decreased sensation in the left toes but no skin breakdown  Psychiatric:        Mood and Affect: Mood normal.        Behavior: Behavior normal.        Thought Content: Thought content normal.     ED Results / Procedures / Treatments   Labs (all labs ordered are listed, but only abnormal results are displayed) Labs Reviewed  COMPREHENSIVE METABOLIC PANEL - Abnormal; Notable for the following components:      Result Value   Sodium 124 (*)    Chloride 88 (*)    Glucose, Bld 653 (*)    AST 47 (*)    ALT 55 (*)    Alkaline Phosphatase 143 (*)    All other components within normal limits  CBG MONITORING, ED - Abnormal; Notable for the following components:   Glucose-Capillary >600 (*)    All other components within normal limits  POCT I-STAT EG7 - Abnormal; Notable for the following components:   pO2, Ven 26.0 (*)    Sodium 134 (*)    All other components within normal limits  CBG MONITORING, ED - Abnormal; Notable for the following components:   Glucose-Capillary 348 (*)    All other components  within normal limits  CBC WITH DIFFERENTIAL/PLATELET    EKG EKG Interpretation  Date/Time:  Wednesday Jan 23 2020 17:59:33 EDT Ventricular Rate:  82 PR Interval:    QRS Duration: 85 QT Interval:  360 QTC Calculation: 421 R Axis:   85 Text Interpretation: Sinus rhythm RSR' in V1 or V2, probably normal variant Consider left ventricular hypertrophy ST elev, probable normal early repol pattern No significant change since last tracing Confirmed by Blanchie Dessert [phone]) on 01/23/2020 6:30:44  PM   Radiology No results found.  Procedures Procedures (including critical care time)  Medications Ordered in ED Medications  sodium chloride 0.9 % bolus 2,000 mL (has no administration in time range)    ED Course  I have reviewed the triage vital signs and the nursing notes.  Pertinent labs & imaging results that were available during my care of the patient were reviewed by me and considered in my medical decision making (see chart for details).    MDM Rules/Calculators/A&P                      Patient is a 49 year old male with a history of diabetes presenting today with elevated sugars, symptoms of generalized weakness, dizziness, fatigue.  Patient was seen last week after having a stomach bug and having significant hyperglycemia.  He states since that time he continues to feel poorly.  His sugars run between 400 and greater than 600.  He reports everything has worsened in the last 6 months since he started working overnight shift from 6 PM to 6 AM.  He reports he is not eating the things he supposed to eat.  He is taking Metformin usually at least every day but does not always take it twice a day because he states it makes him feel bad and gives him diarrhea.  He also has not followed up with his regular doctor which she was supposed to do 4 months ago but states it is harder to follow-up because of his work schedule and also he is scared they are going to put him on insulin.  He currently  is denying any specific abdominal pain or vomiting and lower suspicion for DKA today.  Patient's blood sugar is greater than 600 today and will do labs to confirm exact sugar numbers.  Patient was given 2 L of fluid.  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  9:17 PM No significant change with EKG.  Labs with hyperglycemia but no evidence of DKA at this time with normal pH.  After 2 L of fluid patient's blood sugar is improved to 348.  He has Metformin at home and reports he is going to follow-up with his doctor tomorrow.  MDM Number of Diagnoses or Management Options   Amount and/or Complexity of Data Reviewed Clinical lab tests: ordered and reviewed Tests in the medicine section of CPT: ordered and reviewed Obtain history from someone other than the patient: no Review and summarize past medical records: yes Discuss the patient with other providers: no Independent visualization of images, tracings, or specimens: yes  Risk of Complications, Morbidity, and/or Mortality Presenting problems: moderate Diagnostic procedures: low Management options: low  Patient Progress Patient progress: improved   Final Clinical Impression(s) / ED Diagnoses Final diagnoses:  Hyperglycemia    Rx / DC Orders ED Discharge Orders    None       Blanchie Dessert, MD 01/23/20 2118

## 2020-01-23 NOTE — ED Triage Notes (Signed)
High Blood sugar at home, c/o weakness and fatique. Seen here last week for same. Pt did not take diabetic meds today. Alert, amb .

## 2020-01-23 NOTE — ED Notes (Signed)
Pt on monitor 

## 2020-01-23 NOTE — ED Notes (Signed)
Pt states that he was suppose to f/u with his dr 5-6 months ago but could not states he does check his sugar and takes a metformin when it isd abnormal states has been dizzy , losing weight , tired , thirsty, states hates needles

## 2020-01-24 ENCOUNTER — Other Ambulatory Visit: Payer: Self-pay

## 2020-01-24 ENCOUNTER — Ambulatory Visit (INDEPENDENT_AMBULATORY_CARE_PROVIDER_SITE_OTHER): Payer: BC Managed Care – PPO | Admitting: Family Medicine

## 2020-01-24 ENCOUNTER — Observation Stay (HOSPITAL_COMMUNITY)
Admission: EM | Admit: 2020-01-24 | Discharge: 2020-01-26 | Disposition: A | Discharge status: Home / Self Care | Payer: BC Managed Care – PPO | Attending: Family Medicine | Admitting: Family Medicine

## 2020-01-24 VITALS — BP 98/64 | HR 90 | Ht 64.0 in | Wt 103.2 lb

## 2020-01-24 DIAGNOSIS — Z720 Tobacco use: Secondary | ICD-10-CM | POA: Diagnosis not present

## 2020-01-24 DIAGNOSIS — E1142 Type 2 diabetes mellitus with diabetic polyneuropathy: Secondary | ICD-10-CM | POA: Insufficient documentation

## 2020-01-24 DIAGNOSIS — E46 Unspecified protein-calorie malnutrition: Secondary | ICD-10-CM | POA: Diagnosis not present

## 2020-01-24 DIAGNOSIS — F419 Anxiety disorder, unspecified: Secondary | ICD-10-CM | POA: Diagnosis not present

## 2020-01-24 DIAGNOSIS — Z20822 Contact with and (suspected) exposure to covid-19: Secondary | ICD-10-CM | POA: Insufficient documentation

## 2020-01-24 DIAGNOSIS — F17210 Nicotine dependence, cigarettes, uncomplicated: Secondary | ICD-10-CM | POA: Insufficient documentation

## 2020-01-24 DIAGNOSIS — Z79899 Other long term (current) drug therapy: Secondary | ICD-10-CM | POA: Insufficient documentation

## 2020-01-24 DIAGNOSIS — R739 Hyperglycemia, unspecified: Secondary | ICD-10-CM | POA: Diagnosis not present

## 2020-01-24 DIAGNOSIS — E1165 Type 2 diabetes mellitus with hyperglycemia: Secondary | ICD-10-CM

## 2020-01-24 DIAGNOSIS — F40231 Fear of injections and transfusions: Secondary | ICD-10-CM | POA: Diagnosis not present

## 2020-01-24 DIAGNOSIS — E785 Hyperlipidemia, unspecified: Secondary | ICD-10-CM | POA: Insufficient documentation

## 2020-01-24 DIAGNOSIS — Z9114 Patient's other noncompliance with medication regimen: Secondary | ICD-10-CM | POA: Insufficient documentation

## 2020-01-24 DIAGNOSIS — Z7984 Long term (current) use of oral hypoglycemic drugs: Secondary | ICD-10-CM | POA: Insufficient documentation

## 2020-01-24 DIAGNOSIS — E1100 Type 2 diabetes mellitus with hyperosmolarity without nonketotic hyperglycemic-hyperosmolar coma (NKHHC): Secondary | ICD-10-CM

## 2020-01-24 DIAGNOSIS — E1140 Type 2 diabetes mellitus with diabetic neuropathy, unspecified: Secondary | ICD-10-CM

## 2020-01-24 DIAGNOSIS — R531 Weakness: Secondary | ICD-10-CM | POA: Diagnosis not present

## 2020-01-24 DIAGNOSIS — E119 Type 2 diabetes mellitus without complications: Secondary | ICD-10-CM

## 2020-01-24 DIAGNOSIS — Z681 Body mass index (BMI) 19 or less, adult: Secondary | ICD-10-CM | POA: Insufficient documentation

## 2020-01-24 DIAGNOSIS — E1169 Type 2 diabetes mellitus with other specified complication: Secondary | ICD-10-CM | POA: Diagnosis not present

## 2020-01-24 LAB — BASIC METABOLIC PANEL
Anion gap: 12 (ref 5–15)
BUN: 10 mg/dL (ref 6–20)
CO2: 25 mmol/L (ref 22–32)
Calcium: 9.4 mg/dL (ref 8.9–10.3)
Chloride: 97 mmol/L — ABNORMAL LOW (ref 98–111)
Creatinine, Ser: 0.76 mg/dL (ref 0.61–1.24)
GFR calc Af Amer: >60 mL/min (ref >60)
GFR calc non Af Amer: >60 mL/min (ref >60)
Glucose, Bld: 388 mg/dL — ABNORMAL HIGH (ref 70–99)
Potassium: 4.4 mmol/L (ref 3.5–5.1)
Sodium: 134 mmol/L — ABNORMAL LOW (ref 135–145)

## 2020-01-24 LAB — CBC
HCT: 48 % (ref 39.0–52.0)
Hemoglobin: 16.4 g/dL (ref 13.0–17.0)
MCH: 29.7 pg (ref 26.0–34.0)
MCHC: 34.2 g/dL (ref 30.0–36.0)
MCV: 87 fL (ref 80.0–100.0)
Platelets: 414 10*3/uL — ABNORMAL HIGH (ref 150–400)
RBC: 5.52 MIL/uL (ref 4.22–5.81)
RDW: 11.7 % (ref 11.5–15.5)
WBC: 10.2 10*3/uL (ref 4.0–10.5)
nRBC: 0 % (ref 0.0–0.2)

## 2020-01-24 LAB — POCT GLYCOSYLATED HEMOGLOBIN (HGB A1C): HbA1c, POC (controlled diabetic range): 14.2 % — AB (ref 0.0–7.0)

## 2020-01-24 LAB — SARS CORONAVIRUS 2 BY RT PCR (HOSPITAL ORDER, PERFORMED IN ~~LOC~~ HOSPITAL LAB): SARS Coronavirus 2: NEGATIVE

## 2020-01-24 LAB — GLUCOSE, POCT (MANUAL RESULT ENTRY): POC Glucose: 438 mg/dl — AB (ref 70–99)

## 2020-01-24 LAB — CBG MONITORING, ED: Glucose-Capillary: 419 mg/dL — ABNORMAL HIGH (ref 70–99)

## 2020-01-24 MED ORDER — INSULIN ASPART 100 UNIT/ML ~~LOC~~ SOLN
0.0000 [IU] | Freq: Three times a day (TID) | SUBCUTANEOUS | Status: DC
  Administered 2020-01-25: 3 [IU] via SUBCUTANEOUS

## 2020-01-24 MED ORDER — MELATONIN 3 MG PO TABS
3.0000 mg | ORAL_TABLET | Freq: Every day | ORAL | Status: DC
  Administered 2020-01-25: 3 mg via ORAL
  Filled 2020-01-24 (×2): qty 1

## 2020-01-24 MED ORDER — DEXTROSE-NACL 5-0.45 % IV SOLN: INTRAVENOUS | Status: DC

## 2020-01-24 MED ORDER — ACETAMINOPHEN 650 MG RE SUPP: 650.0000 mg | Freq: Four times a day (QID) | RECTAL | Status: DC | PRN

## 2020-01-24 MED ORDER — ENOXAPARIN SODIUM 30 MG/0.3ML ~~LOC~~ SOLN: 30.0000 mg | SUBCUTANEOUS | Status: DC

## 2020-01-24 MED ORDER — INSULIN ASPART 100 UNIT/ML ~~LOC~~ SOLN: 0.0000 [IU] | Freq: Three times a day (TID) | SUBCUTANEOUS | Status: DC

## 2020-01-24 MED ORDER — ACETAMINOPHEN 325 MG PO TABS: 650.0000 mg | ORAL_TABLET | Freq: Four times a day (QID) | ORAL | Status: DC | PRN

## 2020-01-24 MED ORDER — ROSUVASTATIN CALCIUM 20 MG PO TABS
20.0000 mg | ORAL_TABLET | Freq: Every day | ORAL | Status: DC
  Administered 2020-01-25 – 2020-01-26 (×2): 20 mg via ORAL
  Filled 2020-01-24 (×2): qty 1

## 2020-01-24 MED ORDER — DEXTROSE 50 % IV SOLN: 0.0000 mL | INTRAVENOUS | Status: DC | PRN

## 2020-01-24 MED ORDER — NICOTINE 7 MG/24HR TD PT24
7.0000 mg | MEDICATED_PATCH | Freq: Every day | TRANSDERMAL | Status: DC
  Filled 2020-01-24 (×2): qty 1

## 2020-01-24 MED ORDER — SODIUM CHLORIDE 0.9 % IV SOLN: INTRAVENOUS | Status: DC

## 2020-01-24 MED ORDER — INSULIN REGULAR(HUMAN) IN NACL 100-0.9 UT/100ML-% IV SOLN: INTRAVENOUS | Status: DC

## 2020-01-24 MED ORDER — ONDANSETRON HCL 4 MG PO TABS: 4.0000 mg | ORAL_TABLET | Freq: Three times a day (TID) | ORAL | Status: DC | PRN

## 2020-01-24 MED ORDER — INSULIN ASPART 100 UNIT/ML ~~LOC~~ SOLN
5.0000 [IU] | Freq: Once | SUBCUTANEOUS | Status: AC
  Administered 2020-01-25: 5 [IU] via SUBCUTANEOUS

## 2020-01-24 MED ORDER — SODIUM CHLORIDE 0.9 % IV BOLUS
2000.0000 mL | Freq: Once | INTRAVENOUS | Status: AC
  Administered 2020-01-24: 2000 mL via INTRAVENOUS

## 2020-01-24 NOTE — Assessment & Plan Note (Signed)
>>  ASSESSMENT AND PLAN FOR TYPE 2 DIABETES MELLITUS WITH HYPERGLYCEMIA ([facility]) WRITTEN ON 01/24/2020  2:25 PM BY Oralia Manis, DO  Patient with hyperglycemia both in office and in ED. Appears to have poor control and poor understanding of diabetes. Is non compliant on medications and has not seen PCP in some time due to fear of learning his blood sugar is out of control. Patient is unwilling to start insulin today despite offering samples and teaching with pharmacy team. Discussed at length the need for control, patient able to verbalize risk of poor control including death. Discussed with preceptor Dr. Manson Passey. Patient would benefit from direct admission given poor control an inability to safely go home and lower sugars. Unfortunately, bed control reports no beds available for admission. Given no beds available, will plan to admit through the ED. Discussed with Verlon Au, charge RN in ED. Discussed with inpatient UL Dr. Selena Batten. Patient agreeable with plan and was taken to ED by CMA in wheelchair. I would recommend admission for blood glucose control and education.

## 2020-01-24 NOTE — ED Notes (Signed)
Labs at bedside

## 2020-01-24 NOTE — ED Provider Notes (Signed)
[facility] EMERGENCY DEPARTMENT Provider Note   CSN: [id number] Arrival date & time: 01/24/20  1322     History Chief Complaint  Patient presents with  . Hyperglycemia    Matthew Lynch is a 49 y.o. male.  49 year old male presents with complaint of elevated blood sugar.  Patient states that he does not feel well, has unintentional weight loss and his blood sugars have been high.  Patient reports compliance with his 1000 mg twice daily Metformin.  Patient was seen at family practice clinic today and was sent to the ER for admission for his hyperglycemia.        Past Medical History:  Diagnosis Date  . Diabetes mellitus without complication [facility])     Patient Active Problem List   Diagnosis Date Noted  . Type 2 diabetes mellitus with hyperglycemia ([place]) 01/24/2020  . Hyperlipidemia associated with type 2 diabetes mellitus ([place]) 01/25/2019  . Chronic bilateral low back pain without sciatica 08/24/2018  . Diabetes mellitus ([place]) 02/01/2018    No past surgical history on file.     No family history on file.  Social History   Tobacco Use  . Smoking status: Current Every Day Smoker    Types: Cigarettes  . Smokeless tobacco: Never Used  Substance Use Topics  . Alcohol use: Never  . Drug use: Never    Home Medications Prior to Admission medications   Medication Sig Start Date End Date Taking? Authorizing Provider  metFORMIN (GLUCOPHAGE) 1000 MG tablet Take 1 tablet (1,000 mg total) by mouth 2 (two) times daily with a meal. 08/02/19  Yes [name], MD  rosuvastatin (CRESTOR) 20 MG tablet Take 1 tablet (20 mg total) by mouth daily. 01/25/19  Yes Diallo, Abdoulaye, MD  Blood Glucose Monitoring Suppl (ONETOUCH VERIO) w/Device KIT 1 each by Does not apply route daily. 02/03/18   Mayo, Pete Pelt, MD  Dulaglutide (TRULICITY) 4.09 WJ/1.9JY SOPN Inject 0.75 mg into the skin once a week. Patient not taking: Reported on 01/24/2020 02/01/18   Mayo, Pete Pelt, MD    glucose blood [facility] VERIO) test strip Check blood sugar 2-3 times daily 02/03/18   Mayo, Pete Pelt, MD  ibuprofen (ADVIL,MOTRIN) 600 MG tablet Take 1 tablet (600 mg total) by mouth every 6 (six) hours as needed for mild pain or moderate pain. Patient not taking: Reported on 01/24/2020 08/24/18   Marjie Skiff, MD  Lancet Devices (ONE TOUCH DELICA LANCING DEV) MISC Check blood sugar 2-3 times daily 02/03/18   Mayo, Pete Pelt, MD  [facility] LANCETS 78G MISC Check blood sugar 2-3 times daily 02/03/18   Mayo, Pete Pelt, MD    Allergies    Patient has no known allergies.  Review of Systems   Review of Systems  Constitutional: Positive for unexpected weight change. Negative for fever.  Respiratory: Negative for shortness of breath.   Cardiovascular: Negative for chest pain.  Gastrointestinal: Positive for abdominal pain. Negative for constipation, diarrhea, nausea and vomiting.  Endocrine: Positive for polydipsia, polyphagia and polyuria.  Genitourinary: Positive for frequency. Negative for difficulty urinating and dysuria.  Musculoskeletal: Positive for myalgias.  Skin: Negative for rash and wound.  Allergic/Immunologic: Positive for immunocompromised state.  Neurological: Positive for weakness.  Psychiatric/Behavioral: Negative for confusion.    Physical Exam Updated Vital Signs BP (!) 111/94   Pulse 82   Temp 98.5 F (36.9 C) (Oral)   Resp 15   Ht 5' 4"  (1.626 m)   Wt 46.8 kg   SpO2  100%   BMI 17.71 kg/m   Physical Exam Vitals and nursing note reviewed.  Constitutional:      General: He is not in acute distress.    Appearance: He is well-developed. He is not diaphoretic.     Comments: thin  HENT:     Head: Normocephalic and atraumatic.  Cardiovascular:     Rate and Rhythm: Normal rate and regular rhythm.     Pulses: Normal pulses.     Heart sounds: Normal heart sounds.  Pulmonary:     Effort: Pulmonary effort is normal.     Breath sounds: Normal breath  sounds.  Abdominal:     Palpations: Abdomen is soft.     Tenderness: There is no abdominal tenderness.  Musculoskeletal:     Right lower leg: No edema.     Left lower leg: No edema.  Skin:    General: Skin is warm and dry.     Findings: No erythema or rash.  Neurological:     Mental Status: He is alert and oriented to person, place, and time.  Psychiatric:        Behavior: Behavior normal.     ED Results / Procedures / Treatments   Labs (all labs ordered are listed, but only abnormal results are displayed) Labs Reviewed  CBG MONITORING, ED - Abnormal; Notable for the following components:      Result Value   Glucose-Capillary 419 (*)    All other components within normal limits  SARS CORONAVIRUS 2 BY RT PCR (HOSPITAL ORDER, [facility])  BASIC METABOLIC PANEL  CBC WITH DIFFERENTIAL/PLATELET  URINALYSIS, ROUTINE W REFLEX MICROSCOPIC  CBG MONITORING, ED    EKG None  Radiology No results found.  Procedures Procedures (including critical care time)  Medications Ordered in ED Medications  sodium chloride 0.9 % bolus 2,000 mL (has no administration in time range)  insulin regular, human (MYXREDLIN) 100 units/ 100 mL infusion (has no administration in time range)  0.9 %  sodium chloride infusion (has no administration in time range)  dextrose 5 %-0.45 % sodium chloride infusion (has no administration in time range)  dextrose 50 % solution 0-50 mL (has no administration in time range)    ED Course  I have reviewed the triage vital signs and the nursing notes.  Pertinent labs & imaging results that were available during my care of the patient were reviewed by me and considered in my medical decision making (see chart for details).  Clinical Course as of Jan 24 2132  Thu Jan 24, 7072  3049 49 year old male with history of diabetes sent by PCP for admission.  Patient states that he needs to come into the hospital so that he can learn how to use  insulin.  Denies history of prior episode of DKA. Labs reviewed from PCP office visit today, hemoglobin A1c of 14. Labs drawn in the ER reviewed on printed lab results.  CBC with increase in platelets to 414 otherwise within normal limits.  BMP with sodium of 134, chloride 97, glucose 388.  Remaining results within normal meds including a potassium of 4.4, bicarb of 25, anion gap of 12 and creatinine of 0.76.  Hyperglycemia non-HHS non-DKA protocol initiated.  Family practice paged for consult for admission.   [LM]  2133 Case discussed with hospitalist will consult for admission.   [LM]    Clinical Course User Index [LM] Roque Lias   MDM Rules/Calculators/A&P  Final Clinical Impression(s) / ED Diagnoses Final diagnoses:  Hyperglycemia    Rx / DC Orders ED Discharge Orders    None       Roque Lias 01/24/20 2133    Maudie Flakes, MD 01/30/20 1520

## 2020-01-24 NOTE — ED Notes (Signed)
Pt LWBS. Called pts name 3x with no response.

## 2020-01-24 NOTE — ED Notes (Signed)
Pt came back in pt stated he went out to his car.

## 2020-01-24 NOTE — H&P (Addendum)
[facility] Admission History and Physical Service Pager: [phone]  Patient name: Matthew Lynch Medical record number: [id number] Date of birth: 01-06-1971 Age: 49 y.o. Gender: male  Primary Care Provider: [name], MD Consultants: none Code Status: Full Preferred Emergency Contact: Daisey Must (mom) [phone]  Chief Complaint:  Symptomatic hyperglycemia  Assessment and Plan: Matthew Lynch is a 49 y.o. male presenting with hyperglycemia. PMH is significant for DM, tobacco use, visual abnormalities (unknown), HLD  Poorly controlled DM without DKA Matthew Lynch is a cachectic 49 year old male who presents today from clinic for poorly controlled blood sugars and the need for diabetic education. Patient was seen several times in ED and clinic in past few weeks for symptomatic hyperglycemia. Was recommended to start insulin and offered education on administration but patient declined previously. Pt was diagnosed with diabetes about 2 years ago and prescribed Metformin and Trulicity. Initial Hgb A1c was 12.5, last A1c a year ago was 9.7. Has never taken Trulicity after it was prescribed as it was a "new" medication and did not feel it was safe. Only takes Metformin sometimes. He is now willing to take PO medications consistently as he is strongly opposed to using injections. Checks BS on average <1/week and have been elevated in 400s consistently. On exam, patient is alert and oriented and able to follow commands with mild tenderness to abdominal palpation, cachectic, hypovolemic, anxious. Labs at [facility] clinic this afternoon at 2pm: Na 134, K 4.4, Glucose 388, BUN 10, Cr 0.76, Gap 12, WBC 10.2, Hb 16.4, A1c 14.2. Considered DKA as cause of hyperglycemia however anion gap is normal. UA pending, however did have 40 ketones last week in ER, likely 2/2 dehydration. Considered HHS however patient without characteristic findings CBG>600 and altered mental status. S/p 2L  normal saline in the ED. Pt is not altered and vomiting has stopped which is reassuring.  He has a strong family history of diabetes and a sister who was diagnosed with Type I diabetes as an adolescent.  Weighed 125lbs 12/2017 when initially diagnosed with diabetes and is 103.2lbs on presentation today. Because of his lean body habitus w/o features of metabolic syndrome, catabolic presentation and fam hx, will conduct workup for Type I DM, latent autoimmune diabetes in adults (LADA). Consider other rarer causes of abnormal diabetes presentation such as pancreatic cancer, but pancreas was unremarkable on abdominal CT last week with normal lipase and no EtOH hx. 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 Additionally, will obtain liver function labs to rule out hemochromatosis due to fatigue, diabetes and mildly elevated liver labs on CMP last week (AST 47, ALT 55, alk phos 143). I don't suspect all of these labs to be resulted at time of discharge but have made him a follow up appointment next week with his PCP where they can be reviewed in outpatient setting as this will likely not impact acute management of his hyperglycemic state.  Additionally, will obtain other basic screening labs for new onset diabetes and differential. The biggest goal of this admission will be to initiate and educate on insulin use.  -Admit to [facility], MedSurg-obs, Attending Dr. Ardelia Mems -Vitals per floor routine -Up with assistance - administer 5u novolog tonight for conservative BS control (latest CBG 419) and determine long acting dose in the morning  - TSH - Lipid panel - liver function panel - insulin Ab, c-peptide, GAD65, anti-islet cell Ab, IgA/IgG celiac  Abs -Very sensitive sliding scale -Diabetic education with diabetes coordinator, appreciate recs  - I would encourage patient's godmother to come in to  also receive education as she lives with the patient and would likely be helping him administer/monitor -CBGs with meals and QHS - adding on 3am CBG check as I suspect patient will be very responsive to insulin -Melatonin 82m nightly  -F/u UA, obtain urine albumin-to-creatinine ratio -Am BMP -Am CBC -Likely d/c in next 1-2 days. Will need close follow up as outpatient with PCP. 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 Per notes was avoiding PCP due to fear of requiring insulin -Consider oral diabetic medications as outpatient Vs insulin -Limit lab draws if possible -Consider Ativan if needed  -Consider therapy as outpatient, patient could benefit from CBT  - supply with very small gauge injection needles - maximize oral diabetes agents and lifestyle modifications in order to minimize insulin use  Tobacco use Smokes 3 cigarettes a day since teenager.  -78mNicotine patch   Malnutrition- significant weight loss. Considering neoplasm, malabsorption, DM, poor PO intake as contributing - daily weights - strict I/O - consider calorie count - boost nutrition supplements - other labs as outlined above for ddx - Mg++, phos labs  HLD-  - continue home rosuvastatin - d/u repeat lipid panel  FEN/GI: Diabetic/carb-modified diet, zofran PRN (normal QTc) Prophylaxis: Lovenox   Disposition: Observation  History of Present Illness:  Matthew Lynch a [age].o. male presenting with symptomatic hyperglycemia.  Pt presenting to ED on recommendation from clinic today in order to gain better control of his diabetes and receive extended education on injections and diet. Pt diagnosed with Diabetes 2 years ago but reports non compliance with Trulicity and Metformin. Has had poor follow up with PCP but states that he is now motivated to get better as his symptoms have gotten worse and are impacting his ability to work. Has not  been taking metformin until ED presentation 19th but has been adherent since then with 1,00036mID. Endorses feeling unwell with significant generalized weakness, xerostomia, polyuria, polydipsia, nocturia, blurred vision, nausea since the beginning of the year. Pt began working 12 hour shifts at that time which have abruptly changed his eating habits for the worse and seems to have initiated his symptoms. Had vomiting last week but none since initial ED visit. Continues to have nausea, abdominal pain, peripheral neuropathy symptoms and describes a stiffness that effects only his left arm and leg intermittently. Describes the stiffness as an "arthritis feeling" and he sometimes has to use his right hand to force his left hand open. Will occasionally have difficulty with walking as his left leg becomes so stiff. Also endorses weight loss of approx 34 lbs in past 3 months.Has also had a fall 1.5 weeks ago due to generalized weakness without injury.   On review of systems: pan positive for multiple symptoms, see below.  Review Of Systems: Per HPI with the following additions:   Review of Systems  Constitutional: Positive for activity change, appetite change, chills and fatigue. Negative for fever.  Respiratory: Positive for cough. Negative for shortness of breath.   Cardiovascular: Negative for chest pain and leg swelling.  Gastrointestinal: Positive for abdominal pain, diarrhea, nausea and vomiting.  Endocrine: Positive for polydipsia, polyphagia and polyuria.  Genitourinary: Negative for difficulty urinating, dysuria and hematuria.  Musculoskeletal: Positive for arthralgias and gait problem.  Neurological: Positive for dizziness, weakness, light-headedness and  numbness. Negative for syncope.  Psychiatric/Behavioral: Positive for agitation.       Anxiety     Patient Active Problem List   Diagnosis Date Noted  . Type 2 diabetes mellitus with hyperglycemia ([place]) 01/24/2020  . Type 2 diabetes  mellitus ([place]) 01/24/2020  . Hyperlipidemia associated with type 2 diabetes mellitus ([place]) 01/25/2019  . Chronic bilateral low back pain without sciatica 08/24/2018  . Diabetes mellitus ([place]) 02/01/2018   Past Medical History: Past Medical History:  Diagnosis Date  . Diabetes mellitus without complication [facility])    Past Surgical History: No past surgical history on file.  Social History: Social History   Tobacco Use  . Smoking status: Current Every Day Smoker    Types: Cigarettes  . Smokeless tobacco: Never Used  Substance Use Topics  . Alcohol use: Never  . Drug use: Never   Additional social history: 3 cigarettes per day. Trying to cut back. 6 per day at highest. Since he was a teen.  Please also refer to relevant sections of EMR.  Family History: No family history on file. Sister with type I DM  Allergies and Medications: No Known Allergies No current facility-administered medications on file prior to encounter.   Current Outpatient Medications on File Prior to Encounter  Medication Sig Dispense Refill  . metFORMIN (GLUCOPHAGE) 1000 MG tablet Take 1 tablet (1,000 mg total) by mouth 2 (two) times daily with a meal. 180 tablet 0  . Multiple Vitamin (MULTIVITAMIN WITH MINERALS) TABS tablet Take 1 tablet by mouth daily.    [name] OVER THE COUNTER MEDICATION Take 2 drops by mouth daily. Black seed oil (drops)    . rosuvastatin (CRESTOR) 20 MG tablet Take 1 tablet (20 mg total) by mouth daily. 90 tablet 3  . Blood Glucose Monitoring Suppl (ONETOUCH VERIO) w/Device KIT 1 each by Does not apply route daily. 1 kit 0  . Dulaglutide (TRULICITY) 5.85 ID/7.8EU SOPN Inject 0.75 mg into the skin once a week. (Patient not taking: Reported on 01/24/2020) 4 pen 0  . glucose blood (ONETOUCH VERIO) test strip Check blood sugar 2-3 times daily 100 each 12  . ibuprofen (ADVIL,MOTRIN) 600 MG tablet Take 1 tablet (600 mg total) by mouth every 6 (six) hours as needed for mild pain or moderate pain.  (Patient not taking: Reported on 01/24/2020) 20 tablet 0  . Lancet Devices (ONE TOUCH DELICA LANCING DEV) MISC Check blood sugar 2-3 times daily 1 each 0  . ONETOUCH DELICA LANCETS 23N MISC Check blood sugar 2-3 times daily 100 each 0    Objective: BP 119/70   Pulse 76   Temp 98.5 F (36.9 C) (Oral)   Resp 11   Ht 5' 4"  (1.626 m)   Wt 46.8 kg   SpO2 100%   BMI 17.71 kg/m   Exam: General: Tired, slim appearing 49 yr old male, lying in bed anxious and tearful  Eyes: Normal EOM, no scleral ictuers ENTM: poor dentition, no pharyngeal edema, dry mucous membranes Neck: Supple, normal ROM  Cardiovascular: S1 and S2 present, no m/r/g Respiratory: CTAB, normal WOB  Gastrointestinal: abdomen soft non tender, non distended, bowel sounds present  MSK: no obvious deformities, diffuse muscle wasting Derm: warm and dry  Neuro: alert and oriented, able to follow commands. cranial nerves grossly in tact, 5/5 strength UE, 4/5 strength LE. Normal sensation to touch Psych: anxious mood, anxious affect  Labs and Imaging: CBC BMET  Recent Labs  Lab 01/24/20 1352  WBC 10.2  HGB 16.4  HCT 48.0  PLT 414*   Recent Labs  Lab 01/24/20 1352  NA 134*  K 4.4  CL 97*  CO2 25  BUN 10  CREATININE 0.76  GLUCOSE 388*  CALCIUM 9.4     EKG: SR, RSR in V1 and V2. ST elevation V3. QTc 421   Matthew Haw, MD 01/24/2020, 11:30 PM PGY-1, [place] Intern pager: [phone], text pages welcome  [place]    I have seen and examined this patient.     I have discussed the findings and exam with the intern and agree with the above note, which I have edited appropriately in [place]. I helped develop the management plan that is described in the resident's note, and I agree with the content.   Doristine Mango, DO PGY-2 Family Medicine Resident

## 2020-01-24 NOTE — ED Notes (Signed)
Pt would like RN to come back to re-attempt IV. States he has a fear for needles. RN discussed importance to start IV.

## 2020-01-24 NOTE — ED Notes (Signed)
Pt presents to room c/o several weeks of high blood sugar in the 400s. He states he has been seen in the ED multiple times. Has NOT  been compliant with medication Metformin.

## 2020-01-24 NOTE — ED Notes (Signed)
Admitting at bedside will return for IV and labs

## 2020-01-24 NOTE — Progress Notes (Signed)
    SUBJECTIVE:   CHIEF COMPLAINT / HPI:   Hyperglycemcia  Patient reports fatigue, loss of energy, weakness, vomiting last week, runny nose. Was seen in ED on 5/19 and yesterday. Patient with history of diabetes and his CBGs are too high. Has loss of vision which is chronic. CBGs at home this week have been 387, 405, 512.   Reports sugars have been elevated like this since the beginning of this year. Reports drinking all day and peeing all day long. Recently switched from 8hr work days to [employer] work days. Has lost 34lbs and feels like he is losing weight every day. Reports decreased appetite. Reports nothing is open when he works so reports decreased food options.    Patient currently taking metformin and trulicity. Has never taken trulicity. Reports it is very new and did not understand it so did not feel safe. Reports he is afraid of needles and has been avoiding PCP for fear of elevated A1C and needing insulin. Only takes metformin PRN.   Patient only checks sugars maybe once a week if not less for fear of needles. Patient reports he lives with his godmother who could assist him with injections if needed   PERTINENT  PMH / PSH: T2DM  OBJECTIVE:   BP 98/64   Pulse 90   Ht 5\' 4"  (1.626 m)   Wt 103 lb 3.2 oz (46.8 kg)   SpO2 98%   BMI 17.71 kg/m   Gen: awake and alert, NAD Cardio: RRR, no MRG Resp: CTAB, no wheezes, rales or rhonchi GI: soft, diffuse tenderness, non distended, bowel sounds present   ASSESSMENT/PLAN:   Type 2 diabetes mellitus with hyperglycemia (HCC) Patient with hyperglycemia both in office and in ED. Appears to have poor control and poor understanding of diabetes. Is non compliant on medications and has not seen PCP in some time due to fear of learning his blood sugar is out of control. Patient is unwilling to start insulin today despite offering samples and teaching with pharmacy team. Discussed at length the need for control, patient able to verbalize risk of  poor control including death. Discussed with preceptor Dr. . Patient would benefit from direct admission given poor control an inability to safely go home and lower sugars. Unfortunately, bed control reports no beds available for admission. Given no beds available, will plan to admit through the ED. Discussed with Manson Passey, charge RN in ED. Discussed with inpatient UL Dr. Verlon Au. Patient agreeable with plan and was taken to ED by CMA in wheelchair. I would recommend admission for blood glucose control and education.      Selena Batten, DO [facility]

## 2020-01-24 NOTE — ED Notes (Signed)
Lab called to result CBC and BMP. To send hard copy

## 2020-01-24 NOTE — Assessment & Plan Note (Signed)
Patient with hyperglycemia both in office and in ED. Appears to have poor control and poor understanding of diabetes. Is non compliant on medications and has not seen PCP in some time due to fear of learning his blood sugar is out of control. Patient is unwilling to start insulin today despite offering samples and teaching with pharmacy team. Discussed at length the need for control, patient able to verbalize risk of poor control including death. Discussed with preceptor Dr. Manson Passey. Patient would benefit from direct admission given poor control an inability to safely go home and lower sugars. Unfortunately, bed control reports no beds available for admission. Given no beds available, will plan to admit through the ED. Discussed with Verlon Au, charge RN in ED. Discussed with inpatient UL Dr. Selena Batten. Patient agreeable with plan and was taken to ED by CMA in wheelchair. I would recommend admission for blood glucose control and education.

## 2020-01-24 NOTE — Progress Notes (Deleted)
    SUBJECTIVE:   CHIEF COMPLAINT / HPI:   ***  Patient reports fatigue, loss of energy, weakness, vomiting last week, runny nose. Was seen in ED on 5/19 and yesterday. Patient with history of diabetes and his CBGs are too high. Has loss of vision which is chronic. CBGs at home this week have been 387, 405, 512.   Reports sugars have been elevated like this since the beginning of this year. Reports drinking all day and peeing all day long. Recently switched from 8hr work days to [employer] work days. Has lost 34lbs and feels like he is losing weight every day. Reports decreased appetite. Reports nothing is open when he works so reports decreased food options.    Patient currently taking metformin and trulicity. Has never taken trulicity. Reports it is very new and did not understand it so did not feel safe. Reports he is afraid of needles and has been avoiding PCP for fear of elevated A1C and needing insulin. Only takes metformin PRN.   Patient only checks sugars maybe once a week if not less for fear of needles.   PERTINENT  PMH / PSH: ***  OBJECTIVE:   There were no vitals taken for this visit.  ***  ASSESSMENT/PLAN:   No problem-specific Assessment & Plan notes found for this encounter.     Oralia Manis, DO [facility]

## 2020-01-25 ENCOUNTER — Encounter (HOSPITAL_COMMUNITY): Payer: Self-pay | Admitting: Family Medicine

## 2020-01-25 DIAGNOSIS — E1140 Type 2 diabetes mellitus with diabetic neuropathy, unspecified: Secondary | ICD-10-CM | POA: Diagnosis not present

## 2020-01-25 DIAGNOSIS — Z7984 Long term (current) use of oral hypoglycemic drugs: Secondary | ICD-10-CM | POA: Diagnosis not present

## 2020-01-25 DIAGNOSIS — E785 Hyperlipidemia, unspecified: Secondary | ICD-10-CM | POA: Insufficient documentation

## 2020-01-25 DIAGNOSIS — E46 Unspecified protein-calorie malnutrition: Secondary | ICD-10-CM | POA: Diagnosis not present

## 2020-01-25 DIAGNOSIS — R739 Hyperglycemia, unspecified: Secondary | ICD-10-CM | POA: Diagnosis not present

## 2020-01-25 DIAGNOSIS — Z87891 Personal history of nicotine dependence: Secondary | ICD-10-CM | POA: Insufficient documentation

## 2020-01-25 DIAGNOSIS — E1142 Type 2 diabetes mellitus with diabetic polyneuropathy: Secondary | ICD-10-CM | POA: Diagnosis not present

## 2020-01-25 DIAGNOSIS — F419 Anxiety disorder, unspecified: Secondary | ICD-10-CM | POA: Insufficient documentation

## 2020-01-25 DIAGNOSIS — E1169 Type 2 diabetes mellitus with other specified complication: Secondary | ICD-10-CM | POA: Diagnosis not present

## 2020-01-25 DIAGNOSIS — Z720 Tobacco use: Secondary | ICD-10-CM | POA: Insufficient documentation

## 2020-01-25 DIAGNOSIS — Z79899 Other long term (current) drug therapy: Secondary | ICD-10-CM | POA: Diagnosis not present

## 2020-01-25 DIAGNOSIS — F40231 Fear of injections and transfusions: Secondary | ICD-10-CM | POA: Diagnosis not present

## 2020-01-25 DIAGNOSIS — E1165 Type 2 diabetes mellitus with hyperglycemia: Secondary | ICD-10-CM | POA: Diagnosis not present

## 2020-01-25 DIAGNOSIS — Z20822 Contact with and (suspected) exposure to covid-19: Secondary | ICD-10-CM | POA: Diagnosis not present

## 2020-01-25 DIAGNOSIS — Z9114 Patient's other noncompliance with medication regimen: Secondary | ICD-10-CM | POA: Diagnosis not present

## 2020-01-25 DIAGNOSIS — F17210 Nicotine dependence, cigarettes, uncomplicated: Secondary | ICD-10-CM | POA: Diagnosis not present

## 2020-01-25 DIAGNOSIS — F32A Depression, unspecified: Secondary | ICD-10-CM | POA: Insufficient documentation

## 2020-01-25 DIAGNOSIS — Z681 Body mass index (BMI) 19 or less, adult: Secondary | ICD-10-CM | POA: Diagnosis not present

## 2020-01-25 LAB — HEPATIC FUNCTION PANEL
ALT: 54 U/L — ABNORMAL HIGH (ref 0–44)
AST: 46 U/L — ABNORMAL HIGH (ref 15–41)
Albumin: 3.3 g/dL — ABNORMAL LOW (ref 3.5–5.0)
Alkaline Phosphatase: 75 U/L (ref 38–126)
Bilirubin, Direct: <0.1 mg/dL (ref 0.0–0.2)
Total Bilirubin: 0.6 mg/dL (ref 0.3–1.2)
Total Protein: 6.1 g/dL — ABNORMAL LOW (ref 6.5–8.1)

## 2020-01-25 LAB — BASIC METABOLIC PANEL
Anion gap: 13 (ref 5–15)
Anion gap: 8 (ref 5–15)
BUN: 10 mg/dL (ref 6–20)
BUN: 14 mg/dL (ref 6–20)
CO2: 21 mmol/L — ABNORMAL LOW (ref 22–32)
CO2: 26 mmol/L (ref 22–32)
Calcium: 8.8 mg/dL — ABNORMAL LOW (ref 8.9–10.3)
Calcium: 8.9 mg/dL (ref 8.9–10.3)
Chloride: 100 mmol/L (ref 98–111)
Chloride: 97 mmol/L — ABNORMAL LOW (ref 98–111)
Creatinine, Ser: 0.78 mg/dL (ref 0.61–1.24)
Creatinine, Ser: 0.87 mg/dL (ref 0.61–1.24)
GFR calc Af Amer: >60 mL/min (ref >60)
GFR calc Af Amer: >60 mL/min (ref >60)
GFR calc non Af Amer: >60 mL/min (ref >60)
GFR calc non Af Amer: >60 mL/min (ref >60)
Glucose, Bld: 259 mg/dL — ABNORMAL HIGH (ref 70–99)
Glucose, Bld: 429 mg/dL — ABNORMAL HIGH (ref 70–99)
Potassium: 4.5 mmol/L (ref 3.5–5.1)
Potassium: 4.6 mmol/L (ref 3.5–5.1)
Sodium: 131 mmol/L — ABNORMAL LOW (ref 135–145)
Sodium: 134 mmol/L — ABNORMAL LOW (ref 135–145)

## 2020-01-25 LAB — CBC WITH DIFFERENTIAL/PLATELET
Abs Immature Granulocytes: 0.11 10*3/uL — ABNORMAL HIGH (ref 0.00–0.07)
Basophils Absolute: 0.1 10*3/uL (ref 0.0–0.1)
Basophils Relative: 1 %
Eosinophils Absolute: 0.5 10*3/uL (ref 0.0–0.5)
Eosinophils Relative: 3 %
HCT: 46.5 % (ref 39.0–52.0)
Hemoglobin: 15.8 g/dL (ref 13.0–17.0)
Immature Granulocytes: 1 %
Lymphocytes Relative: 33 %
Lymphs Abs: 4.5 10*3/uL — ABNORMAL HIGH (ref 0.7–4.0)
MCH: 30.3 pg (ref 26.0–34.0)
MCHC: 34 g/dL (ref 30.0–36.0)
MCV: 89.1 fL (ref 80.0–100.0)
Monocytes Absolute: 0.9 10*3/uL (ref 0.1–1.0)
Monocytes Relative: 7 %
Neutro Abs: 7.3 10*3/uL (ref 1.7–7.7)
Neutrophils Relative %: 55 %
Platelets: 394 10*3/uL (ref 150–400)
RBC: 5.22 MIL/uL (ref 4.22–5.81)
RDW: 11.8 % (ref 11.5–15.5)
WBC: 13.4 10*3/uL — ABNORMAL HIGH (ref 4.0–10.5)
nRBC: 0 % (ref 0.0–0.2)

## 2020-01-25 LAB — CBC
HCT: 43.5 % (ref 39.0–52.0)
Hemoglobin: 15 g/dL (ref 13.0–17.0)
MCH: 30.2 pg (ref 26.0–34.0)
MCHC: 34.5 g/dL (ref 30.0–36.0)
MCV: 87.7 fL (ref 80.0–100.0)
Platelets: 344 10*3/uL (ref 150–400)
RBC: 4.96 MIL/uL (ref 4.22–5.81)
RDW: 11.8 % (ref 11.5–15.5)
WBC: 11 10*3/uL — ABNORMAL HIGH (ref 4.0–10.5)
nRBC: 0 % (ref 0.0–0.2)

## 2020-01-25 LAB — HIV ANTIBODY (ROUTINE TESTING W REFLEX): HIV Screen 4th Generation wRfx: NONREACTIVE

## 2020-01-25 LAB — URINALYSIS, ROUTINE W REFLEX MICROSCOPIC
Bacteria, UA: NONE SEEN
Bilirubin Urine: NEGATIVE
Glucose, UA: >=500 mg/dL — AB
Hgb urine dipstick: NEGATIVE
Ketones, ur: 20 mg/dL — AB
Leukocytes,Ua: NEGATIVE
Nitrite: NEGATIVE
Protein, ur: NEGATIVE mg/dL
Specific Gravity, Urine: 1.03 (ref 1.005–1.030)
pH: 6 (ref 5.0–8.0)

## 2020-01-25 LAB — CBG MONITORING, ED: Glucose-Capillary: 325 mg/dL — ABNORMAL HIGH (ref 70–99)

## 2020-01-25 LAB — GLUCOSE, CAPILLARY
Glucose-Capillary: 197 mg/dL — ABNORMAL HIGH (ref 70–99)
Glucose-Capillary: 292 mg/dL — ABNORMAL HIGH (ref 70–99)
Glucose-Capillary: 331 mg/dL — ABNORMAL HIGH (ref 70–99)
Glucose-Capillary: 395 mg/dL — ABNORMAL HIGH (ref 70–99)
Glucose-Capillary: 285 mg/dL — ABNORMAL HIGH (ref 70–99)

## 2020-01-25 LAB — LIPID PANEL
Cholesterol: 166 mg/dL (ref 0–200)
HDL: 32 mg/dL — ABNORMAL LOW (ref >40)
LDL Cholesterol: 80 mg/dL (ref 0–99)
Total CHOL/HDL Ratio: 5.2 RATIO
Triglycerides: 272 mg/dL — ABNORMAL HIGH (ref <150)
VLDL: 54 mg/dL — ABNORMAL HIGH (ref 0–40)

## 2020-01-25 LAB — MAGNESIUM: Magnesium: 1.7 mg/dL (ref 1.7–2.4)

## 2020-01-25 LAB — TSH: TSH: 1.516 u[IU]/mL (ref 0.350–4.500)

## 2020-01-25 LAB — PHOSPHORUS: Phosphorus: 3.8 mg/dL (ref 2.5–4.6)

## 2020-01-25 MED ORDER — LINAGLIPTIN 5 MG PO TABS
5.0000 mg | ORAL_TABLET | Freq: Every day | ORAL | Status: DC
  Administered 2020-01-25 – 2020-01-26 (×2): 5 mg via ORAL
  Filled 2020-01-25 (×2): qty 1

## 2020-01-25 MED ORDER — BOOST / RESOURCE BREEZE PO LIQD CUSTOM
1.0000 | Freq: Three times a day (TID) | ORAL | Status: DC
  Administered 2020-01-25 (×2): 1 via ORAL

## 2020-01-25 MED ORDER — METFORMIN HCL ER 500 MG PO TB24
1000.0000 mg | ORAL_TABLET | Freq: Every day | ORAL | Status: DC
  Administered 2020-01-25 – 2020-01-26 (×2): 1000 mg via ORAL
  Filled 2020-01-25 (×3): qty 2

## 2020-01-25 MED ORDER — ENSURE ENLIVE PO LIQD
237.0000 mL | Freq: Three times a day (TID) | ORAL | Status: DC
  Administered 2020-01-25 – 2020-01-26 (×2): 237 mL via ORAL

## 2020-01-25 NOTE — Progress Notes (Signed)
Received report from ED RN. Room ready for patient. 

## 2020-01-25 NOTE — Plan of Care (Signed)
  Problem: Activity: Goal: Risk for activity intolerance will decrease Outcome: Progressing   

## 2020-01-25 NOTE — Progress Notes (Signed)
Initial Nutrition Assessment  DOCUMENTATION CODES:   Underweight  INTERVENTION:  Provide Ensure Enlive po TID, each supplement provides 350 kcal and 20 grams of protein.  Diet education regarding diabetes given.   NUTRITION DIAGNOSIS:   Increased nutrient needs related to chronic illness as evidenced by estimated needs.  GOAL:   Patient will meet greater than or equal to 90% of their needs  MONITOR:   PO intake, Supplement acceptance, Skin, Labs, Weight trends, I & O's  REASON FOR ASSESSMENT:   Malnutrition Screening Tool    ASSESSMENT:   49 yo M with known history of diabetes presenting with uncontrolled hyperglycemia in setting of medication noncompliance in outpatient setting.  Meal completion has been 100%. Pt reports having a good appetite and hunger during time of visit. Pt reports experiencing excessive eating at home and feelings of constant hunger, likely associated with elevated CBGs/uncontrolled diabetes. Pt reports having phobia of needles, thus only checks his blood sugar a couple of times a week. Pt reports plan to start insulin injections. Pt understand he will have to work with insulin needle injections to better aid in his blood sugar and diabetes. Pt with a 14% weight loss over the past 1 month, which is significant for time frame. RD to order nutritional supplements to aid in caloric and protein needs. Pt currently has Boost Breeze ordered and requests change of supplement.   Diet handout "Counting Carbohydrates for People with Diabetes" from the Academy of Nutrition and Dietetics Manual was given. Discussed different food groups and their effects on blood sugar, emphasizing carbohydrate-containing foods. Provided list of carbohydrates and recommended serving sizes of common foods. Discussed importance of controlled and consistent carbohydrate intake throughout the day. Teach back method used.  NUTRITION - FOCUSED PHYSICAL EXAM:    Most Recent Value  Orbital  Region  Unable to assess  Upper Arm Region  Unable to assess  Thoracic and Lumbar Region  Unable to assess  Buccal Region  Unable to assess  Temple Region  Unable to assess  Clavicle Bone Region  Unable to assess  Clavicle and Acromion Bone Region  Unable to assess  Scapular Bone Region  Unable to assess  Dorsal Hand  Unable to assess  Patellar Region  No depletion  Anterior Thigh Region  No depletion  Posterior Calf Region  Moderate depletion  Edema (RD Assessment)  None  Hair  Reviewed  Eyes  Reviewed  Mouth  Reviewed  Skin  Reviewed  Nails  Reviewed    Pt hesitant on RD performing nutrition focused physical exam at this time. Pt reports he has been poked and touched too many times.  Labs and medications reviewed.   Diet Order:   Diet Order            Diet Carb Modified Fluid consistency: Thin; Room service appropriate? Yes  Diet effective now              EDUCATION NEEDS:   Education needs have been addressed  Skin:  Skin Assessment: Reviewed RN Assessment  Last BM:  5/26  Height:   Ht Readings from Last 1 Encounters:  01/25/20 5\' 4"  (1.626 m)    Weight:   Wt Readings from Last 1 Encounters:  01/25/20 48.3 kg    BMI:  Body mass index is 18.26 kg/m.  Estimated Nutritional Needs:   Kcal:  1900-2100  Protein:  90-100 grams  Fluid:  >/= 1.9 L/day  01/27/20, MS, RD, LDN RD pager number/after hours weekend  pager number on Amion.

## 2020-01-25 NOTE — Progress Notes (Signed)
Inpatient Diabetes Program Recommendations  AACE/ADA: New Consensus Statement on Inpatient Glycemic Control (2015)  Target Ranges:  Prepandial:   less than 140 mg/dL      Peak postprandial:   less than 180 mg/dL (1-2 hours)      Critically ill patients:  140 - 180 mg/dL   Lab Results  Component Value Date   GLUCAP 331 (H) 01/25/2020   HGBA1C 14.2 (A) 01/24/2020    Review of Glycemic Control  Diabetes history: DM 2 Outpatient Diabetes medications: metformin 1000 mg bid Current orders for Inpatient glycemic control:  Tradjenta 5 mg Daily Metformin 1000 mg Daily  Inpatient Diabetes Program Recommendations:    Consider Lantus 10 units.  Spoke with pt at bedside regarding A1c of 14.2%. Discussed need for insulin at time of d/c. Discussed the difficulty of lowering glucose levels to goal only with orals at this level.   Patient and I called pt's God mother, Thayer Jew ([phone]) who lives with him and she agreed that she can give the insulin. She has DM herself and knows how to administer insulin via insulin pen. She said she told pt that he will need insulin "but he's a grown man." Showed pt how to use insulin pen. Told him when and where to inject. Pt said he could do the finger sticks at home. Told him to check glucose twice a day.  Pt has BCBS insurance prescribe Lantus Solostar insulin pen (order # [id number]). Gave pt savings card for $0 copay for lantus. Insulin pen needles (order # [id number])  Thanks,  Christena Deem RN, MSN, BC-ADM Inpatient Diabetes Coordinator Team Pager [phone] (8a-5p)

## 2020-01-25 NOTE — Progress Notes (Signed)
Family Medicine Teaching Service Daily Progress Note Intern Pager: [phone]  Patient name: Matthew Lynch Medical record number: [id number] Date of birth: 1971/03/31 Age: 49 y.o. Gender: male  Primary Care Provider: [name], MD Consultants: none Code Status: Full  Pt Overview and Major Events to Date:    Assessment and Plan: Matthew Lynch is a 49 y.o. male presenting with hyperglycemia. PMH is significant for DM, tobacco use, visual abnormalities (unknown), HLD  Poorly controlled DM without DKA CBGs improved with only 5 units of aspart, decreased from 653 to 197. Discussed diagnosis with patient extensively and he is terrified of needles and fears he will not be compliant with any regimen that includes injecting insulin at this point. Patient acknowledges that an oral regimen would be preferred and that to get better he must take metformin. For another oral medication, patient would be best served by not using a sulfonylurea if possible due to hypoglycemia. Semaglutide has weight loss associated with it and patient has already lost 30 lbs this year and is cachectic appearing. Next best choice is linagliptin. Will start linagliptin and metformin today, monitor overnight without insulin and titrate to see if we can reach acceptable levels for disposition. 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 -Vitals per floor routine -Up with assistance - Continue statin - Start metformin and linagliptin - f/u insulin Ab, c-peptide, GAD65, anti-islet cell Ab, IgA/IgG celiac Abs -Very sensitive sliding scale --CBGs with meals and QHS - f/u ophthalmology   Tobacco use Smokes 3 cigarettes a day since teenager.  -7mg  Nicotine patch   Malnutrition- significant weight loss. Considering neoplasm, malabsorption, DM, poor PO intake as contributing. Most likely patient  - consider calorie count - consider colonoscopy  outpatient - boost nutrition supplements - other labs as outlined above for ddx - Mg++, phos labs  HLD-  Lipid panel reveals total cholesterol 166, HDL 32, LDL 80, triglycerides 272. - continue home rosuvastatin  FEN/GI: Diabetic/carb-modified diet, zofran PRN (normal QTc) Prophylaxis: Lovenox   Disposition: to med-surg pending safe disposition with diabetic medications  Subjective:  Patient is alert, very anxious about having to use needles. States that he doesn't think he will be able to be compliant with insulin.  Objective: Temp:  [97.7 F (36.5 C)-98.5 F (36.9 C)] 97.9 F (36.6 C) (05/28 0556) Pulse Rate:  [68-91] 68 (05/28 0556) Resp:  [11-22] 18 (05/28 0556) BP: (98-122)/(60-95) 101/72 (05/28 0556) SpO2:  [97 %-100 %] 99 % (05/28 0556) Weight:  [46.8 kg-48.3 kg] 48.3 kg (05/28 0552) Physical Exam: General: cachetic, middle-aged AA man, resting comfortably in bed, NAD Cardiovascular: RRR, no m/r/g Respiratory: CTAB, no increased WOB Abdomen: scaphoid, soft, NT, ND Extremities: warm, dry, no edema  Laboratory: Recent Labs  Lab 01/23/20 1736 01/23/20 1736 01/23/20 1948 01/24/20 1352 01/24/20 2349  WBC 10.0  --   --  10.2 13.4*  HGB 16.0   < > 14.3 16.4 15.8  HCT 45.3   < > 42.0 48.0 46.5  PLT 395  --   --  414* 394   < > = values in this interval not displayed.   Recent Labs  Lab 01/23/20 1736 01/23/20 1736 01/23/20 1948 01/24/20 1352 01/24/20 2349  NA 124*   < > 134* 134* 134*  K 4.7   < > 4.7 4.4 4.6  CL 88*  --   --  97* 100  CO2 26  --   --  25 21*  BUN 13  --   --  10 14  CREATININE 0.86  --   --  0.76 0.78  CALCIUM 9.5  --   --  9.4 8.8*  PROT 7.8  --   --   --   --   BILITOT 0.7  --   --   --   --   ALKPHOS 143*  --   --   --   --   ALT 55*  --   --   --   --   AST 47*  --   --   --   --   GLUCOSE 653*  --   --  388* 259*   < > = values in this interval not displayed.   Imaging/Diagnostic Tests: No results found.  Shirlean Mylar, MD 01/25/2020, 6:58 AM PGY-1, [facility] Family Medicine FPTS Intern pager: [phone], text pages welcome

## 2020-01-26 DIAGNOSIS — E1140 Type 2 diabetes mellitus with diabetic neuropathy, unspecified: Secondary | ICD-10-CM | POA: Diagnosis not present

## 2020-01-26 DIAGNOSIS — R739 Hyperglycemia, unspecified: Secondary | ICD-10-CM | POA: Diagnosis not present

## 2020-01-26 LAB — COMPREHENSIVE METABOLIC PANEL
ALT: 48 U/L — ABNORMAL HIGH (ref 0–44)
AST: 23 U/L (ref 15–41)
Albumin: 3.7 g/dL (ref 3.5–5.0)
Alkaline Phosphatase: 74 U/L (ref 38–126)
Anion gap: 8 (ref 5–15)
BUN: 13 mg/dL (ref 6–20)
CO2: 27 mmol/L (ref 22–32)
Calcium: 9.3 mg/dL (ref 8.9–10.3)
Chloride: 101 mmol/L (ref 98–111)
Creatinine, Ser: 0.78 mg/dL (ref 0.61–1.24)
GFR calc Af Amer: >60 mL/min (ref >60)
GFR calc non Af Amer: >60 mL/min (ref >60)
Glucose, Bld: 356 mg/dL — ABNORMAL HIGH (ref 70–99)
Potassium: 4.1 mmol/L (ref 3.5–5.1)
Sodium: 136 mmol/L (ref 135–145)
Total Bilirubin: 0.3 mg/dL (ref 0.3–1.2)
Total Protein: 6.5 g/dL (ref 6.5–8.1)

## 2020-01-26 LAB — CBC
HCT: 45.9 % (ref 39.0–52.0)
Hemoglobin: 15.7 g/dL (ref 13.0–17.0)
MCH: 30 pg (ref 26.0–34.0)
MCHC: 34.2 g/dL (ref 30.0–36.0)
MCV: 87.8 fL (ref 80.0–100.0)
Platelets: 385 10*3/uL (ref 150–400)
RBC: 5.23 MIL/uL (ref 4.22–5.81)
RDW: 11.8 % (ref 11.5–15.5)
WBC: 11 10*3/uL — ABNORMAL HIGH (ref 4.0–10.5)
nRBC: 0 % (ref 0.0–0.2)

## 2020-01-26 LAB — MAGNESIUM: Magnesium: 1.7 mg/dL (ref 1.7–2.4)

## 2020-01-26 LAB — GLUCOSE, CAPILLARY
Glucose-Capillary: 303 mg/dL — ABNORMAL HIGH (ref 70–99)
Glucose-Capillary: 406 mg/dL — ABNORMAL HIGH (ref 70–99)
Glucose-Capillary: 407 mg/dL — ABNORMAL HIGH (ref 70–99)
Glucose-Capillary: 337 mg/dL — ABNORMAL HIGH (ref 70–99)

## 2020-01-26 LAB — C-PEPTIDE: C-Peptide: 1.4 ng/mL (ref 1.1–4.4)

## 2020-01-26 LAB — MICROALBUMIN / CREATININE URINE RATIO
Creatinine, Urine: 42.5 mg/dL
Microalb Creat Ratio: <7 mg/g creat (ref 0–29)
Microalb, Ur: <3 ug/mL — ABNORMAL HIGH

## 2020-01-26 LAB — PHOSPHORUS: Phosphorus: 3.6 mg/dL (ref 2.5–4.6)

## 2020-01-26 MED ORDER — INSULIN PEN NEEDLE 29G X 12.7MM MISC: 1.0000 | Freq: Every day | 0 refills | Status: DC

## 2020-01-26 MED ORDER — ADULT MULTIVITAMIN W/MINERALS CH
1.0000 | ORAL_TABLET | Freq: Every day | ORAL | Status: DC
  Administered 2020-01-26: 1 via ORAL
  Filled 2020-01-26: qty 1

## 2020-01-26 MED ORDER — GLUCERNA SHAKE PO LIQD
237.0000 mL | Freq: Three times a day (TID) | ORAL | Status: DC
  Administered 2020-01-26 (×2): 237 mL via ORAL

## 2020-01-26 MED ORDER — INSULIN GLARGINE 100 UNIT/ML ~~LOC~~ SOLN
5.0000 [IU] | Freq: Every day | SUBCUTANEOUS | Status: DC
  Administered 2020-01-26: 5 [IU] via SUBCUTANEOUS
  Filled 2020-01-26 (×3): qty 0.05

## 2020-01-26 MED ORDER — INSULIN GLARGINE 100 UNIT/ML SOLOSTAR PEN
5.0000 [IU] | PEN_INJECTOR | Freq: Every day | SUBCUTANEOUS | 0 refills | Status: DC
Start: 2020-01-26 — End: 2020-01-26

## 2020-01-26 MED ORDER — INSULIN GLARGINE 100 UNIT/ML SOLOSTAR PEN
10.0000 [IU] | PEN_INJECTOR | Freq: Every day | SUBCUTANEOUS | 0 refills | Status: DC
Start: 2020-01-26 — End: 2020-03-27

## 2020-01-26 MED ORDER — LINAGLIPTIN 5 MG PO TABS: 5.0000 mg | ORAL_TABLET | Freq: Every day | ORAL | 0 refills | Status: DC

## 2020-01-26 MED ORDER — INSULIN GLARGINE 100 UNIT/ML ~~LOC~~ SOLN
5.0000 [IU] | Freq: Once | SUBCUTANEOUS | Status: AC
  Administered 2020-01-26: 5 [IU] via SUBCUTANEOUS
  Filled 2020-01-26: qty 0.05

## 2020-01-26 MED ORDER — METFORMIN HCL ER (OSM) 1000 MG PO TB24: 1000.0000 mg | ORAL_TABLET | Freq: Every day | ORAL | 0 refills | Status: DC

## 2020-01-26 NOTE — Progress Notes (Signed)
Nutrition Follow-up  RD working remotely.  DOCUMENTATION CODES:   Underweight  INTERVENTION:   -D/c Ensure Enlive po TID, each supplement provides 350 kcal and 20 grams of protein -Glucerna Shake po TID, each supplement provides 220 kcal and 10 grams of protein -MVI with minerals daily -Referred to outpatient diabetes education through [facility] Medicine Service for additional support  NUTRITION DIAGNOSIS:   Increased nutrient needs related to chronic illness as evidenced by estimated needs.  Ongoing  GOAL:   Patient will meet greater than or equal to 90% of their needs  Progressing   MONITOR:   PO intake, Supplement acceptance, Skin, Labs, Weight trends, I & O's  REASON FOR ASSESSMENT:   Consult Assessment of nutrition requirement/status, Poor PO, Diet education  ASSESSMENT:   49 yo M with known history of diabetes presenting with uncontrolled hyperglycemia in setting of medication noncompliance in outpatient setting.  Reviewed I/O's: -640 ml x 24 hours and +1.5 L since admission  UOP: 2.5 L x 24 hours  Attempted to speak with pt via phone, however, no answer.   RD assessed pt yesterday; see note on 01/25/20 for further details. Pt was provided education at this time. Per DM coordinator note, pt will likely discharge home on insulin.   Pt with good appetite; noted consuming 100% of meals as well as Ensure supplements. Given pt's good oral intake and hyperglycemia, will transition to Glucerna supplements in attempt to improve blood sugar control.   Pt is active with Family Medicine Resident service; pt would greatly benefit from outpatient diabetes education for further reinforcement and support. Pt is apprehensive about using insulin.   Labs reviewed: CBGS: 285-395 (inpatient orders for glycemic control are 5 mg tradjenta daily and 1000 mg metformin daily).   Diet Order:   Diet Order            Diet Carb Modified Fluid consistency: Thin; Room service appropriate?  Yes  Diet effective now              EDUCATION NEEDS:   Education needs have been addressed  Skin:  Skin Assessment: Reviewed RN Assessment  Last BM:  01/23/20  Height:   Ht Readings from Last 1 Encounters:  01/25/20 5\' 4"  (1.626 m)    Weight:   Wt Readings from Last 1 Encounters:  01/26/20 48.1 kg   BMI:  Body mass index is 18.2 kg/m.  Estimated Nutritional Needs:   Kcal:  1900-2100  Protein:  90-100 grams  Fluid:  >/= 1.9 L/day    01/28/20, RD, LDN, CDCES Registered Dietitian II Certified Diabetes Care and Education Specialist Please refer to [facility] for RD and/or RD on-call/weekend/after hours pager

## 2020-01-26 NOTE — Progress Notes (Signed)
Family Medicine Teaching Service Daily Progress Note Intern Pager: [phone]  Patient name: Matthew Lynch Medical record number: [id number] Date of birth: 1971/03/24 Age: 49 y.o. Gender: male  Primary Care Provider: [name], MD Consultants: IP CONSULT TO FAMILY PRACTICE CONSULT TO DIABETES COORDINATOR Code Status: Full Code   Pt Overview and Major Events to Date:  Hospital Day: 3 01/24/2020: admitted for Hyperglycemia  Assessment and Plan: Matthew Lynch is a 49 y.o. male who presented w/ hyperglycemia.  PMHx s/f tobacco abuse, visual abnormalities?, HLD, DM   Poorly controlled diabetes, no DKA Attempted to control patient's sugars as outpatient, however, unable to do so and thus patient was sent to the ED for inpatient admission.  Patient not a candidate for insulin as he has a phobia of needles and would otherwise be unable to use this medication outpatient.  Started linagliptin and Metformin yesterday. Also starting 5 units lantus today. Will monitor and d/c later on today. Godmother available to give insuling shots at home. Can consider glipizide if patient is not taking lantus at home. Patient CBGs have been averaging around 300.  Last 356 fasting this morning. -Continue Metformin and linagliptin, titrate as needed -likely d/c home later this afternoon if CBG okay after initial lantus dose  -Follow-up insulin antibody, GAD 65, NT at blood cell  Weight loss Suspected to be due to poorly controlled diabetes, worsening p.o. intake.  Should also consider malignancy. -Dietitian consulted today   HLD  Continue crestor 20 mg   Tobacco asue  3 cigarettes a day since teens  -nicotine patch PRN   FEN/GI:  . Fluids: None . Electrolytes: wnl   . Nutrition: carb modified    Access: Right PIV  VTE prophylaxis: Lovenox 30 (CrCl <30)   Disposition: When medically stable  TBD -  likely later on today    Subjective:  NAEO.   Objective: Temp:  [97.8 F (36.6 C)-99 F (37.2  C)] 98.3 F (36.8 C) (05/29 0630) Pulse Rate:  [66-75] 66 (05/29 0630) Resp:  [16-19] 16 (05/29 0630) BP: (89-110)/(57-76) 101/65 (05/29 0630) SpO2:  [98 %-100 %] 99 % (05/29 0630) Weight:  [48.1 kg] 48.1 kg (05/29 0107) Intake/Output      05/28 0701 - 05/29 0700 05/29 0701 - 05/30 0700   P.O. 1820    IV Piggyback     Total Intake(mL/kg) 1820 (37.8)    Urine (mL/kg/hr) 2460 (2.1)    Emesis/NG output 0    Other 0    Stool 0    Blood 0    Total Output 2460    Net -640         Urine Occurrence 3 x    Stool Occurrence 1 x    Emesis Occurrence 0 x        Physical Exam: Gen: NAD, alert, non-toxic, lying comfortably in bed, very thin Skin: Warm and dry. No obvious rashes, lesions, or trauma. HEENT: NCAT. EOMI. No conjunctival pallor or injection. No scleral icterus or injection.  MMM.  Neck: Soft, supple. No LAD. No JVD. CV: RRR.  Normal S1, S2. No m/r/g.  RP 2+ bilaterally. No BLEE. Resp: CTAB.  No w/r/r.  No increased WOB Abd: +BS. NTND on palpation. Psych: Cooperative. Pleasant. Makes eye contact. Speech normal. Extremities: Moves extremities spontaneously. Extremities warm and well perfused.  Neuro: CN II-XII grossly intact. No FNDs.   Laboratory: I have personally read and reviewed all labs and imaging studies.  CBC: Recent Labs  Lab 01/23/20 1736 01/23/20 1948  01/24/20 2349 01/25/20 1308 01/26/20 0734  WBC 10.0   < > 13.4* 11.0* 11.0*  NEUTROABS 5.5  --  7.3  --   --   HGB 16.0   < > 15.8 15.0 15.7  HCT 45.3   < > 46.5 43.5 45.9  MCV 85.6   < > 89.1 87.7 87.8  PLT 395   < > 394 344 385   < > = values in this interval not displayed.   CMP: Recent Labs  Lab 01/23/20 1736 01/23/20 1948 01/24/20 2349 01/25/20 1308 01/26/20 0734  NA 124*   < > 134* 131* 136  K 4.7   < > 4.6 4.5 4.1  CL 88*   < > 100 97* 101  CO2 26   < > 21* 26 27  GLUCOSE 653*   < > 259* 429* 356*  BUN 13   < > 14 10 13   CREATININE 0.86   < > 0.78 0.87 0.78  CALCIUM 9.5   < > 8.8* 8.9  9.3  MG  --   --   --  1.7 1.7  PHOS  --   --   --  3.8 3.6  ALBUMIN 4.2  --   --  3.3* 3.7   < > = values in this interval not displayed.   CBG: Recent Labs  Lab 01/25/20 0709 01/25/20 1108 01/25/20 1628 01/25/20 2152 01/26/20 0633  GLUCAP 292* 331* 395* 285* 303*   Micro: Covid Negative  Recent Results (from the past 240 hour(s))  SARS Coronavirus 2 by RT PCR (hospital order, performed in [facility] hospital lab) Nasopharyngeal Nasopharyngeal Swab     Status: None   Collection Time: 01/24/20  9:30 PM   Specimen: Nasopharyngeal Swab  Result Value Ref Range Status   SARS Coronavirus 2 NEGATIVE NEGATIVE Final    Comment: (NOTE) SARS-CoV-2 target nucleic acids are NOT DETECTED. The SARS-CoV-2 RNA is generally detectable in upper and lower respiratory specimens during the acute phase of infection. The lowest concentration of SARS-CoV-2 viral copies this assay can detect is 250 copies / mL. A negative result does not preclude SARS-CoV-2 infection and should not be used as the sole basis for treatment or other patient management decisions.  A negative result may occur with improper specimen collection / handling, submission of specimen other than nasopharyngeal swab, presence of viral mutation(s) within the areas targeted by this assay, and inadequate number of viral copies (<250 copies / mL). A negative result must be combined with clinical observations, patient history, and epidemiological information. Fact Sheet for Patients:   01/26/20 Fact Sheet for Healthcare Providers: BoilerBrush.com.cy This test is not yet approved or cleared  by the https://pope.com/ FDA and has been authorized for detection and/or diagnosis of SARS-CoV-2 by FDA under an Emergency Use Authorization (EUA).  This EUA will remain in effect (meaning this test can be used) for the duration of the COVID-19 declaration under Section 564(b)(1) of the Act,  21 U.S.C. section 360bbb-3(b)(1), unless the authorization is terminated or revoked sooner. Performed at [facility], [address] [place], [place]      Imaging/Diagnostic Tests: No results found.  EKG Interpretation  Date/Time:    Ventricular Rate:    PR Interval:    QRS Duration:   QT Interval:    QTC Calculation:   R Axis:     Text Interpretation:          Procedures:  Procedure Orders  No procedure(s) ordered today    Wilber Oliphant, MD 01/26/2020, 8:55 AM PGY-2, [place] Intern pager: [phone], text pages welcome

## 2020-01-26 NOTE — Discharge Instructions (Addendum)
Dear Matthew Lynch,   Thank you for letting us participate in your care! In this section, you will find a brief hospital admission summary of why you were admitted to the hospital, what happened during your admission, your diagnosis/diagnoses, and recommended follow up.   You were admitted because your blood sugars were high in the setting of your type 2 diabetes.  You have been prescribed insulin in the past, but have not felt comfortable with the needles.  We have started you on 2 oral medications and we are sending you home with insulin.  When you left, you reported that your godmother would be able to inject the insulin daily as you did not want to do it.  Additionally, you reported that you felt comfortable obtaining your blood glucose levels on your own at home.  You were given 5 units of Lantus prior to leaving the hospital and did well.  Please continue taking 5 units of Lantus at home in the mornings as well as the remainder of your oral medications, metformin and linagliptin.    POST-HOSPITAL & CARE INSTRUCTIONS 1. Please let PCP/Specialists know of any changes that were made.  2. Please see medications section of this packet for any medication changes.  3. Please bring all of your medications to your doctors' appointments   [place]  Future Appointments  Date Time Provider [place]  01/29/2020  3:30 PM [name], MD [facility]  02/18/2020  2:50 PM [name], MD [facility]     Thank you for choosing [facility]! Take care and be well!  [facility]  [place], [place] [address] [phone]         Carbohydrate Counting For People With Diabetes  Foods with carbohydrates make your blood glucose level go up. Learning how to count carbohydrates can help you control your blood glucose levels. First, identify the  foods you eat that contain carbohydrates. Then, using the Foods with Carbohydrates chart, determine about how much carbohydrates are in your meals and snacks. Make sure you are eating foods with fiber, protein, and healthy fat along with your carbohydrate foods. Foods with Carbohydrates The following table shows carbohydrate foods that have about 15 grams of carbohydrate each. Using measuring cups, spoons, or a food scale when you first begin learning about carbohydrate counting can help you learn about the portion sizes you typically eat. The following foods have 15 grams carbohydrate each:  Grains . 1 slice bread (1 ounce)  . 1 small tortilla (6-inch size)  .  large bagel (1 ounce)  . 1/3 cup pasta or rice (cooked)  .  hamburger or hot dog bun ( ounce)  .  cup cooked cereal  .  to  cup ready-to-eat cereal  . 2 taco shells (5-inch size) Fruit . 1 small fresh fruit ( to 1 cup)  .  medium banana  . 17 small grapes (3 ounces)  . 1 cup melon or berries  .  cup canned or frozen fruit  . 2 tablespoons dried fruit (blueberries, cherries, cranberries, raisins)  .  cup unsweetened fruit juice  Starchy Vegetables .  cup cooked beans, peas, corn, potatoes/sweet potatoes  .  large baked potato (3 ounces)  . 1 cup acorn or butternut squash  Snack Foods . 3 to 6 crackers  . 8 potato chips or 13 tortilla chips ( ounce to  1 ounce)  . 3 cups popped popcorn  Dairy . 3/4 cup (6 ounces) nonfat plain yogurt, or yogurt with sugar-free sweetener  . 1 cup milk  . 1 cup plain rice, soy, coconut or flavored almond milk Sweets and Desserts .  cup ice cream or frozen yogurt  . 1 tablespoon jam, jelly, pancake syrup, table sugar, or honey  . 2 tablespoons light pancake syrup  . 1 inch square of frosted cake or 2 inch square of unfrosted cake  . 2 small cookies (2/3 ounce each) or  large cookie  Sometimes you'll have to estimate carbohydrate amounts if you don't know the exact recipe. One cup  of mixed foods like soups can have 1 to 2 carbohydrate servings, while some casseroles might have 2 or more servings of carbohydrate. Foods that have less than 20 calories in each serving can be counted as "free" foods. Count 1 cup raw vegetables, or  cup cooked non-starchy vegetables as "free" foods. If you eat 3 or more servings at one meal, then count them as 1 carbohydrate serving.  Foods without Carbohydrates  Not all foods contain carbohydrates. Meat, some dairy, fats, non-starchy vegetables, and many beverages don't contain carbohydrate. So when you count carbohydrates, you can generally exclude chicken, pork, beef, fish, seafood, eggs, tofu, cheese, butter, sour cream, avocado, nuts, seeds, olives, mayonnaise, water, black coffee, unsweetened tea, and zero-calorie drinks. Vegetables with no or low carbohydrate include green beans, cauliflower, tomatoes, and onions. How much carbohydrate should I eat at each meal?  Carbohydrate counting can help you plan your meals and manage your weight. Following are some starting points for carbohydrate intake at each meal. Work with your registered dietitian nutritionist to find the best range that works for your blood glucose and weight.   To Lose Weight To Maintain Weight  Women 2 - 3 carb servings 3 - 4 carb servings  Men 3 - 4 carb servings 4 - 5 carb servings  Checking your blood glucose after meals will help you know if you need to adjust the timing, type, or number of carbohydrate servings in your meal plan. Achieve and keep a healthy body weight by balancing your food intake and physical activity.  Tips How should I plan my meals?  Plan for half the food on your plate to include non-starchy vegetables, like salad greens, broccoli, or carrots. Try to eat 3 to 5 servings of non-starchy vegetables every day. Have a protein food at each meal. Protein foods include chicken, fish, meat, eggs, or beans (note that beans contain carbohydrate). These two food  groups (non-starchy vegetables and proteins) are low in carbohydrate. If you fill up your plate with these foods, you will eat less carbohydrate but still fill up your stomach. Try to limit your carbohydrate portion to  of the plate.  What fats are healthiest to eat?  Diabetes increases risk for heart disease. To help protect your heart, eat more healthy fats, such as olive oil, nuts, and avocado. Eat less saturated fats like butter, cream, and high-fat meats, like bacon and sausage. Avoid trans fats, which are in all foods that list "partially hydrogenated oil" as an ingredient. What should I drink?  Choose drinks that are not sweetened with sugar. The healthiest choices are water, carbonated or seltzer waters, and tea and coffee without added sugars.  Sweet drinks will make your blood glucose go up very quickly. One serving of soda or energy drink is  cup. It is best to drink  these beverages only if your blood glucose is low.  Artificially sweetened, or diet drinks, typically do not increase your blood glucose if they have zero calories in them. Read labels of beverages, as some diet drinks do have carbohydrate and will raise your blood glucose. Label Reading Tips Read Nutrition Facts labels to find out how many grams of carbohydrate are in a food you want to eat. Don't forget: sometimes serving sizes on the label aren't the same as how much food you are going to eat, so you may need to calculate how much carbohydrate is in the food you are serving yourself.   Carbohydrate Counting for People with Diabetes Sample 1-Day Menu  Breakfast  cup yogurt, low fat, low sugar (1 carbohydrate serving)   cup cereal, ready-to-eat, unsweetened (1 carbohydrate serving)  1 cup strawberries (1 carbohydrate serving)   cup almonds ( carbohydrate serving)  Lunch 1, 5 ounce can chunk light tuna  2 ounces cheese, low fat cheddar  6 whole wheat crackers (1 carbohydrate serving)  1 small apple (1 carbohydrate  servings)   cup carrots ( carbohydrate serving)   cup snap peas  1 cup 1% milk (1 carbohydrate serving)   Evening Meal Stir fry made with: 3 ounces chicken  1 cup brown rice (3 carbohydrate servings)   cup broccoli ( carbohydrate serving)   cup green beans   cup onions  1 tablespoon olive oil  2 tablespoons teriyaki sauce ( carbohydrate serving)  Evening Snack 1 extra small banana (1 carbohydrate serving)  1 tablespoon peanut butter   Carbohydrate Counting for People with Diabetes Vegan Sample 1-Day Menu  Breakfast 1 cup cooked oatmeal (2 carbohydrate servings)   cup blueberries (1 carbohydrate serving)  2 tablespoons flaxseeds  1 cup soymilk fortified with calcium and vitamin D  1 cup coffee  Lunch 2 slices whole wheat bread (2 carbohydrate servings)   cup baked tofu   cup lettuce  2 slices tomato  2 slices avocado   cup baby carrots ( carbohydrate serving)  1 orange (1 carbohydrate serving)  1 cup soymilk fortified with calcium and vitamin D   Evening Meal Burrito made with: 1 6-inch corn tortilla (1 carbohydrate serving)  1 cup refried vegetarian beans (2 carbohydrate servings)   cup chopped tomatoes   cup lettuce   cup salsa  1/3 cup brown rice (1 carbohydrate serving)  1 tablespoon olive oil for rice   cup zucchini   Evening Snack 6 small whole grain crackers (1 carbohydrate serving)  2 apricots ( carbohydrate serving)   cup unsalted peanuts ( carbohydrate serving)    Carbohydrate Counting for People with Diabetes Vegetarian (Lacto-Ovo) Sample 1-Day Menu  Breakfast 1 cup cooked oatmeal (2 carbohydrate servings)   cup blueberries (1 carbohydrate serving)  2 tablespoons flaxseeds  1 egg  1 cup 1% milk (1 carbohydrate serving)  1 cup coffee  Lunch 2 slices whole wheat bread (2 carbohydrate servings)  2 ounces low-fat cheese   cup lettuce  2 slices tomato  2 slices avocado   cup baby carrots ( carbohydrate serving)  1 orange (1  carbohydrate serving)  1 cup unsweetened tea  Evening Meal Burrito made with: 1 6-inch corn tortilla (1 carbohydrate serving)   cup refried vegetarian beans (1 carbohydrate serving)   cup tomatoes   cup lettuce   cup salsa  1/3 cup brown rice (1 carbohydrate serving)  1 tablespoon olive oil for rice   cup zucchini  1 cup 1% milk (1  carbohydrate serving)  Evening Snack 6 small whole grain crackers (1 carbohydrate serving)  2 apricots ( carbohydrate serving)   cup unsalted peanuts ( carbohydrate serving)    Copyright 2020  Academy of [employer] and Dietetics. All rights reserved.  Using Nutrition Labels: Carbohydrate  . Serving Size  . Look at the serving size. All the information on the label is based on this portion. Jolyne Loa Per Container  . The number of servings contained in the package. . Guidelines for Carbohydrate  . Look at the total grams of carbohydrate in the serving size.  . 1 carbohydrate choice = 15 grams of carbohydrate. Range of Carbohydrate Grams Per Choice  Carbohydrate Grams/Choice Carbohydrate Choices  6-10   11-20 1  21-25 1  26-35 2  36-40 2  41-50 3  51-55 3  56-65 4  66-70 4  71-80 5    Copyright 2020  Academy of Nutrition and Dietetics. All rights reserved.

## 2020-01-26 NOTE — Discharge Summary (Addendum)
[facility] Discharge Summary  Patient name: Matthew Lynch Medical record number: [id number] Date of birth: 19-Dec-1970 Age: 49 y.o. Gender: male Date of Admission: 01/24/2020  Date of Discharge: 01/26/20    Admitting Physician: Leeanne Rio, MD  Primary Care Provider: [name], MD Consultants: diabetes coordinator   Indication for Hospitalization: <principal problem not specified>   Discharge Diagnoses/Problem List:  Active Problems:   Type 2 diabetes mellitus ([place])  Disposition: home  Discharge Condition: Good  Discharge Exam:  BP (!) 106/58 (BP Location: Right Arm)   Pulse 71   Temp 98 F (36.7 C) (Oral)   Resp 18   Ht _0  (1.626 m)   Wt 48.1 kg   SpO2 98%   BMI 18.20 kg/m  General: NAD, non-toxic, well-appearing, lying comfortably in bed Cardiovascular: RRR, normal S1, S2. 2+ RP bilaterally.  No BLEE  Respiratory: CTAB. No IWOB.  Abdomen: + BS. NT, ND, soft to palpation.  Extremities: Warm and well perfused. Moving spontaneously.   Brief Hospital Course:  Matthew Lynch is a 49 y.o. 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  At PCP appointment, patient's sugars were elevated greater than 600, though patient was not experiencing any altered mental status at the time.  Given patient's persistently high sugars, patient benefited from admission to the hospital.  Patient C-peptide was within normal limits and patient diagnosed with type 2 diabetes. During hospitalization, patient was transitioned to Metformin 1000 mg XL and started on linagliptin daily.  Through the hospitalization, patient was given diabetes education.  On day of discharge, we started 10 units of Lantus and his sugars were stable throughout the day.  Patient reports that his godmother will be able to inject his insulin at home daily.  He has no issues  taking oral medications. On day of discharge, patient complained of left distal extremity tingling, pain, numbness of his lateral digits, 3 through 5.  He reports that this feeling also happens in his hands.  Discussed neuropathy with patient and importance of keeping diabetes under control as it can increase many complications such as kidney, neuropathy, cardiac.  Patient understanding.  Tolerating pain well at this time.  Did not start any gabapentin.  Issues for Follow Up:  1. Improvement of blood sugars 2. Compliance with medication  Significant Procedures:  Procedure Orders    No procedure(s) ordered today    Significant Labs and Imaging:  Recent Labs  Lab 01/24/20 2349 01/25/20 1308 01/26/20 0734  WBC 13.4* 11.0* 11.0*  HGB 15.8 15.0 15.7  HCT 46.5 43.5 45.9  PLT 394 344 385   Recent Labs  Lab 01/23/20 1736 01/23/20 1736 01/23/20 1948 01/23/20 1948 01/24/20 1352 01/24/20 1352 01/24/20 2349 01/24/20 2349 01/25/20 1308 01/26/20 0734  NA 124*   < > 134*  --  134*  --  134*  --  131* 136  K 4.7   < > 4.7   < > 4.4   < > 4.6   < > 4.5 4.1  CL 88*  --   --   --  97*  --  100  --  97* 101  CO2 26  --   --   --  25  --  21*  --  26 27  GLUCOSE 653*  --   --   --  388*  --  259*  --  429* 356*  BUN 13  --   --   --  10  --  14  --  10 13  CREATININE 0.86  --   --   --  0.76  --  0.78  --  0.87 0.78  CALCIUM 9.5  --   --   --  9.4  --  8.8*  --  8.9 9.3  MG  --   --   --   --   --   --   --   --  1.7 1.7  PHOS  --   --   --   --   --   --   --   --  3.8 3.6  ALKPHOS 143*  --   --   --   --   --   --   --  75 74  AST 47*  --   --   --   --   --   --   --  46* 23  ALT 55*  --   --   --   --   --   --   --  54* 48*  ALBUMIN 4.2  --   --   --   --   --   --   --  3.3* 3.7   < > = values in this interval not displayed.    CT Abdomen Pelvis W Contrast  Result Date: 01/16/2020 CLINICAL DATA:  Nonlocalized abdominal pain with nausea and diarrhea EXAM: CT ABDOMEN AND PELVIS  WITH CONTRAST TECHNIQUE: Multidetector CT imaging of the abdomen and pelvis was performed using the standard protocol following bolus administration of intravenous contrast. CONTRAST:  60m OMNIPAQUE IOHEXOL 300 MG/ML  SOLN COMPARISON:  None. FINDINGS: Lower chest: No acute abnormality. Hepatobiliary: No focal liver abnormality is seen. No gallstones, gallbladder wall thickening, or biliary dilatation. Pancreas: Unremarkable. No pancreatic ductal dilatation or surrounding inflammatory changes. Spleen: Normal in size without focal abnormality. Adrenals/Urinary Tract: Adrenal glands are unremarkable. Kidneys are normal, without renal calculi, focal lesion, or hydronephrosis. Bladder is unremarkable. Stomach/Bowel: Stomach is within normal limits. Appendix appears normal. No evidence of bowel wall thickening, distention, or inflammatory changes. Vascular/Lymphatic: No significant vascular findings are present. No enlarged abdominal or pelvic lymph nodes. Reproductive: Prostate is unremarkable. Other: No abdominal wall hernia or abnormality. No abdominopelvic ascites. Musculoskeletal: No acute or significant osseous findings. IMPRESSION: Negative. No CT evidence for acute intra-abdominal or pelvic abnormality Electronically Signed   By: KDonavan FoilM.D.   On: 01/16/2020 20:01   DG Chest Port 1 View  Result Date: 01/16/2020 CLINICAL DATA:  Chest and abdominal pain. Nausea and diarrhea. Fatigue. EXAM: PORTABLE CHEST 1 VIEW COMPARISON:  None. FINDINGS: Multiple overlying monitoring devices. The cardiomediastinal contours are normal. The lungs are clear. Pulmonary vasculature is normal. No consolidation, pleural effusion, or pneumothorax. No acute osseous abnormalities are seen. IMPRESSION: Unremarkable portable AP view of the chest. Electronically Signed   By: MKeith RakeM.D.   On: 01/16/2020 19:46    Results/Tests Pending at Time of Discharge:  . Insulin antibodies, GAD, Anti-islet, tTG   Discharge  Medications:  Allergies as of 01/26/2020   No Known Allergies     Medication List    STOP taking these medications   Dulaglutide 0.75 MG/0.5ML Sopn Commonly known as: Trulicity   ibuprofen 6161MG tablet Commonly known as: ADVIL   metFORMIN 1000 MG tablet Commonly known as: GLUCOPHAGE Replaced by: metformin 1000 MG (OSM) 24 hr tablet  multivitamin with minerals Tabs tablet   ONE TOUCH DELICA LANCING DEV Misc     TAKE these medications   glucose blood test strip Commonly known as: OneTouch Verio Check blood sugar 2-3 times daily   insulin glargine 100 UNIT/ML Solostar Pen Commonly known as: LANTUS Inject 10 Units into the skin daily.   Insulin Pen Needle 29G X 12.7MM Misc 1 Stick by Does not apply route daily.   linagliptin 5 MG Tabs tablet Commonly known as: TRADJENTA Take 1 tablet (5 mg total) by mouth daily. Start taking on: Jan 27, 2020   metformin 1000 MG (OSM) 24 hr tablet Commonly known as: FORTAMET Take 1 tablet (1,000 mg total) by mouth daily with breakfast. Start taking on: Jan 27, 2020 Replaces: metFORMIN 1000 MG tablet   OneTouch Delica Lancets 62B Misc Check blood sugar 2-3 times daily   OneTouch Verio w/Device Kit 1 each by Does not apply route daily.   OVER THE COUNTER MEDICATION Take 2 drops by mouth daily. Black seed oil (drops)   rosuvastatin 20 MG tablet Commonly known as: Crestor Take 1 tablet (20 mg total) by mouth daily.       Discharge Instructions: Please refer to Patient Instructions section of EMR for full details.  Patient was counseled important signs and symptoms that should prompt return to medical care, changes in medications, dietary instructions, activity restrictions, and follow up appointments.   Follow-Up Appointments: Future Appointments  Date Time Provider [place]  01/29/2020  3:30 PM [name], MD [facility]  02/18/2020  2:50 PM [name], MD [facility]   Wilber Oliphant, MD 01/26/2020, 12:59  PM PGY-2, [place], [place], DO 01/26/2020, 5:59 PM PGY-1, [place]

## 2020-01-26 NOTE — Progress Notes (Signed)
DISCHARGE NOTE HOME Matthew Lynch to be discharged Home per MD order. Discussed prescriptions and follow up appointments with the patient. Prescriptions given to patient; medication list explained in detail. Patient verbalized understanding.  Skin clean, dry and intact without evidence of skin break down, no evidence of skin tears noted. IV catheter discontinued intact. Site without signs and symptoms of complications. Dressing and pressure applied. Pt denies pain at the site currently. No complaints noted.  Patient free of lines, drains, and wounds.   An After Visit Summary (AVS) was printed and given to the patient. Patient escorted via wheelchair, and discharged home via private auto.  Lorine Bears, RN

## 2020-01-28 LAB — ANTI-ISLET CELL ANTIBODY: Pancreatic Islet Cell Antibody: NEGATIVE

## 2020-01-29 ENCOUNTER — Ambulatory Visit (INDEPENDENT_AMBULATORY_CARE_PROVIDER_SITE_OTHER): Payer: BC Managed Care – PPO | Admitting: Family Medicine

## 2020-01-29 ENCOUNTER — Other Ambulatory Visit: Payer: Self-pay

## 2020-01-29 ENCOUNTER — Encounter: Payer: Self-pay | Admitting: Family Medicine

## 2020-01-29 VITALS — BP 110/62 | HR 86 | Ht 64.0 in | Wt 105.0 lb

## 2020-01-29 DIAGNOSIS — F339 Major depressive disorder, recurrent, unspecified: Secondary | ICD-10-CM | POA: Insufficient documentation

## 2020-01-29 DIAGNOSIS — F321 Major depressive disorder, single episode, moderate: Secondary | ICD-10-CM | POA: Diagnosis not present

## 2020-01-29 DIAGNOSIS — E1165 Type 2 diabetes mellitus with hyperglycemia: Secondary | ICD-10-CM

## 2020-01-29 DIAGNOSIS — Z1159 Encounter for screening for other viral diseases: Secondary | ICD-10-CM | POA: Diagnosis not present

## 2020-01-29 LAB — GLUTAMIC ACID DECARBOXYLASE AUTO ABS: Glutamic Acid Decarb Ab: <5 U/mL (ref 0.0–5.0)

## 2020-01-29 LAB — GLIA (IGA/G) + TTG IGA
Antigliadin Abs, IgA: 3 units (ref 0–19)
Gliadin IgG: 2 units (ref 0–19)
Tissue Transglutaminase Ab, IgA: <2 U/mL (ref 0–3)

## 2020-01-29 LAB — GLUCOSE, POCT (MANUAL RESULT ENTRY): POC Glucose: 257 mg/dl — AB (ref 70–99)

## 2020-01-29 MED ORDER — FLUOXETINE HCL 20 MG PO TABS: 20.0000 mg | ORAL_TABLET | Freq: Every day | ORAL | 3 refills | Status: DC

## 2020-01-29 NOTE — Assessment & Plan Note (Signed)
We discussed the importance of continuing diabetes medication for the maintenance of his health.  No evidence of acute distress at this visit.  CBG 257. -Continue Metformin 1000 mg twice daily -Continue insulin and linagliptin as finances permit -Message sent to Dr. Raymondo Band regarding medication assistance program -Follow-up CMP to ensure return to baseline labs -Follow-up in 2 weeks

## 2020-01-29 NOTE — Patient Instructions (Signed)
High blood sugar: And started affording medications has been so difficult for you.  I am going to speak with our social worker to help the medication assistance.  In the meantime, continue taking your Metformin.  I would like to follow-up with you in clinic in 2 weeks to make sure you are doing okay on just the Metformin.  This will also be a good opportunity for [place] to make sure you are getting plugged in with medication assistance.  Depression: Going to start you on a medication called fluoxetine.  This is an affordable medication.  It is $4 at [facility] for 1 month supply.  I have also included a list of therapists you can contact to establish care.  I recommend that you establish care with a therapist for cognitive behavioral therapy to help with depression.  Lets follow-up in 1 month to see how you are doing.   Therapy and Counseling Resources Most providers on this list will take Medicaid. Patients with commercial insurance or Medicare should contact their insurance company to get a list of in network providers.  [facility]  [address], Suite [place], [place] [address]      [phone]  [facility] [address]  [place], [place] [address] [phone]  [facility] [address]  [place], [place] [address]       [phone]      Jovita Kussmaul [facility] [address] [place], [place] [phone]  [facility]:  [address] [place] [phone]  [facility]:  [address] [place] [place], [place] [phone]  [facility] (under & uninsured) [address]   [place] [phone]    [email or web address]    [facility] [place] -[address]     Phone:  [phone]     [facility]-  [address]  [phone]   [facility] [address]      [place], [place] [phone]  ext [phone]  [facility]: [address] [place], [place], [place]  [phone]   [facility] (Spanish therapist) [address]   [place] [address]    [phone]    [facility]   [address],  [place], [place]  [phone]   [facility]  [address] [place]  [phone]  [facility]  [address] [place], [place]        [phone]  Open Access[facility]Walk In Clinic under & uninsured  [place], To schedule an appointment call [phone] [address], [place] [phone]):  Macario Golds from 8 AM - 3 PM Moving June 1 to [facility] at [facility] [address]  [facility] [address],  (Spanish)   [address], [place]: [phone]) 8:30 - 12; 1 - 2:30  [facility] [employer],  [address] [place], [place]    ([phone]):8:30 - 12; 2 - 3PM  [facility],  [address],  [place]; [phone]):   Mon - Fri 8 AM - 5 PM  [facility] [address] [place]  MWF 12:30 to 3:00 or call to schedule an appointment  [phone]  Specific Provider options Psychology Today  [email or web address].com/us 1. click on find a therapist  2. enter  your zip code 3. left side and select or tailor a therapist for your specific need.   [facility] Provider Directory [email or web address]  (Medicaid)   Follow all drop down to find a provider  [place] [phone] or [email or web address] [address], [place], [place] Recovery support and educational   24- Hour Availability:  . [place] or [phone]  . [facility] [employer] [phone]  [facility] Crisis Service  [phone]   . [place]  [phone] (after hours)  . Therapeutic  Alternative/Mobile Crisis   [phone]  . [place] National Suicide Hotline  731 051 2561 ([place])  . Call 911 or go to emergency room  . [employer]  [phone]);  [place] and [employer]   . [facility]  [phone]); [place], [place], [place], [place], [place], [place], [place]

## 2020-01-29 NOTE — Assessment & Plan Note (Signed)
He is interested in medication and therapy. -Fluoxetine 20 mg started today -List of local therapists provided -Follow-up in 4 weeks -Suicide prevention helpline provided

## 2020-01-29 NOTE — Progress Notes (Signed)
° ° °  SUBJECTIVE:   CHIEF COMPLAINT / HPI:   Hyperglycemia [name] was recently hospitalized for hyperglycemia.  During his time in the hospital, he was started on insulin and linagliptin in addition to his previous Metformin.  He was discharged on 5/29 with insulin, linagliptin and Metformin.  Today in clinic, he reports that he has only been taking Metformin since his hospital discharge.  He reports that the medications prescribed to him at hospital discharge cost a total of $80 which she cannot afford.  He is planning to pick them up on Friday once he has been paid and can afford them.  Presently, he is experiencing mild thirst and increased urination.  Both of these symptoms are significantly improved compared to his hospital admission though still present.  He reports that he has been scared by his recent hospitalization and wants to take his diabetes very seriously to improve his health.  Depression Today in clinic, he scored a total of 24 points on his PHQ-9.  He answered 0 for question 9 indicating no thoughts of SI/HI.  When asked, he denied thoughts of self-harm or harm to others.  He reports a variety of changes in his social life and work life in the past year which have been particularly stressful.  He reports experiencing these severe depressive symptoms for at least the past 5 months.  He wants to know what can be done to improve the symptoms.  PERTINENT  PMH / PSH: Recent hospital admission for hyperglycemia  OBJECTIVE:   BP 110/62    Pulse 86    Ht 5\' 4"  (1.626 m)    Wt 105 lb (47.6 kg)    SpO2 91%    BMI 18.02 kg/m    General: Well-appearing middle-aged man in no acute distress. HEENT: Mildly dry mucous membranes. Cardiac: No M/R/G.  Regular rate. Respiratory: Breathing comfortably on room air.  No shortness of breath, no evidence of respiratory distress.  Clear to auscultation bilaterally Abdomen: No significant abdominal tenderness.  Bowel sounds normal. Extremities:  Warm, dry, no skin tenting.   ASSESSMENT/PLAN:   Diabetes mellitus (HCC) We discussed the importance of continuing diabetes medication for the maintenance of his health.  No evidence of acute distress at this visit.  CBG 257. -Continue Metformin 1000 mg twice daily -Continue insulin and linagliptin as finances permit -Message sent to Dr. regarding medication assistance program -Follow-up CMP to ensure return to baseline labs -Follow-up in 2 weeks  Depression, major, single episode, moderate (HCC) He is interested in medication and therapy. -Fluoxetine 20 mg started today -List of local therapists provided -Follow-up in 4 weeks -Suicide prevention helpline provided     Raymondo Band, MD [facility]

## 2020-01-30 LAB — COMPREHENSIVE METABOLIC PANEL
ALT: 47 IU/L — ABNORMAL HIGH (ref 0–44)
AST: 24 IU/L (ref 0–40)
Albumin/Globulin Ratio: 2 (ref 1.2–2.2)
Albumin: 4.7 g/dL (ref 4.0–5.0)
Alkaline Phosphatase: 96 IU/L (ref 48–121)
BUN/Creatinine Ratio: 16 (ref 9–20)
BUN: 16 mg/dL (ref 6–24)
Bilirubin Total: 0.3 mg/dL (ref 0.0–1.2)
CO2: 21 mmol/L (ref 20–29)
Calcium: 10.2 mg/dL (ref 8.7–10.2)
Chloride: 94 mmol/L — ABNORMAL LOW (ref 96–106)
Creatinine, Ser: 0.98 mg/dL (ref 0.76–1.27)
GFR calc Af Amer: 106 mL/min/{1.73_m2} (ref >59)
GFR calc non Af Amer: 91 mL/min/{1.73_m2} (ref >59)
Globulin, Total: 2.4 g/dL (ref 1.5–4.5)
Glucose: 240 mg/dL — ABNORMAL HIGH (ref 65–99)
Potassium: 4.9 mmol/L (ref 3.5–5.2)
Sodium: 134 mmol/L (ref 134–144)
Total Protein: 7.1 g/dL (ref 6.0–8.5)

## 2020-01-31 ENCOUNTER — Encounter: Payer: Self-pay | Admitting: Family Medicine

## 2020-01-31 ENCOUNTER — Other Ambulatory Visit: Payer: Self-pay | Admitting: Family Medicine

## 2020-01-31 DIAGNOSIS — Z1159 Encounter for screening for other viral diseases: Secondary | ICD-10-CM

## 2020-02-02 LAB — HEPATITIS C ANTIBODY: Hep C Virus Ab: <0.1 s/co ratio (ref 0.0–0.9)

## 2020-02-02 LAB — SPECIMEN STATUS REPORT

## 2020-02-03 LAB — INSULIN ANTIBODIES, BLOOD: Insulin Antibodies, Human: <5 uU/mL

## 2020-02-04 ENCOUNTER — Other Ambulatory Visit: Payer: Self-pay | Admitting: Family Medicine

## 2020-02-04 DIAGNOSIS — E1140 Type 2 diabetes mellitus with diabetic neuropathy, unspecified: Secondary | ICD-10-CM

## 2020-02-04 NOTE — Progress Notes (Unsigned)
c 

## 2020-02-07 ENCOUNTER — Telehealth: Payer: Self-pay | Admitting: Family Medicine

## 2020-02-07 ENCOUNTER — Other Ambulatory Visit: Payer: Self-pay | Admitting: Family Medicine

## 2020-02-07 NOTE — Telephone Encounter (Signed)
Matthew Lynch came to the office to pick up [facility] paperwork, could not be located in front office drawer nor written in the notebook; please call  Him at [phone]

## 2020-02-08 ENCOUNTER — Telehealth: Payer: Self-pay | Admitting: Family Medicine

## 2020-02-08 NOTE — Chronic Care Management (AMB) (Signed)
  Care Management   Note  02/08/2020 Name: Matthew Lynch MRN: [id number] DOB: October 24, 1970  Matthew Lynch is a 49 y.o. year old male who is a primary care patient of [name], MD. I reached out to [name] by phone today in response to a referral sent by Matthew Lynch's health plan.    Matthew Lynch was given information about care management services today including:  1. Care management services include personalized support from designated clinical staff supervised by his physician, including individualized plan of care and coordination with other care providers 2. 24/7 contact phone numbers for assistance for urgent and routine care needs. 3. The patient may stop care management services at any time by phone call to the office staff.  Patient agreed to services and verbal consent obtained.   Follow up plan: Telephone appointment with care management team member scheduled for:02/20/2020  Elisha Ponder, LPN Health Advisor, Embedded Care Coordination [facility] Health Care Management ??[email or web address] ??[phone]

## 2020-02-11 NOTE — Telephone Encounter (Signed)
Will check with MD.  Despina Boan,CMA  

## 2020-02-11 NOTE — Telephone Encounter (Signed)
I placed this paperwork in the front office last Thursday.  Was he not able to pick it up?  [name], MD

## 2020-02-11 NOTE — Telephone Encounter (Signed)
I called Mr. Foutz who let me know that he was able to pick up all the paperwork he needed and is not missing anything at this time.  He was encouraged to call back or reach out if any work issues arise.  [name], MD

## 2020-02-18 ENCOUNTER — Ambulatory Visit: Payer: BC Managed Care – PPO | Admitting: Family Medicine

## 2020-02-18 NOTE — Progress Notes (Deleted)
° ° °  SUBJECTIVE:   CHIEF COMPLAINT / HPI:   Diabetes lantus 10 units linagliptin Metformin crestor    PERTINENT  PMH / PSH: ***  OBJECTIVE:   There were no vitals taken for this visit.  ***  ASSESSMENT/PLAN:   No problem-specific Assessment & Plan notes found for this encounter.     [name], MD [facility] [place]

## 2020-02-20 ENCOUNTER — Other Ambulatory Visit: Payer: Self-pay

## 2020-02-20 ENCOUNTER — Ambulatory Visit: Payer: BC Managed Care – PPO

## 2020-02-20 DIAGNOSIS — E1165 Type 2 diabetes mellitus with hyperglycemia: Secondary | ICD-10-CM

## 2020-02-20 NOTE — Patient Instructions (Signed)
Visit Information  Goals Addressed              This Visit's Progress   .  I am tired all the time (pt-stated)        CARE PLAN ENTRY (see longtitudinal plan of care for additional care plan information)  Objective:  Lab Results  Component Value Date   HGBA1C 14.2 (A) 01/24/2020 .   Lab Results  Component Value Date   CREATININE 0.98 01/29/2020   CREATININE 0.78 01/26/2020   CREATININE 0.87 01/25/2020 .   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  He does not eat the correct things because all he has around him is food at the job evetrhing else is closed.  He does not check his blood sugars as he should. . Knowledge Deficits related to medications used for management of diabetes . Difficulty obtaining or cannot afford medications . Financial Constraints  Case Manager Clinical Goal(s):  Over the next 90 days, patient will demonstrate improved adherence to prescribed treatment plan for diabetes self care/management as evidenced by: lowering his a1c by 1-2 points . daily monitoring and recording of CBG  . adherence to prescribed medication regimen  Interventions:  . Provided education to patient about basic DM disease process . Reviewed medications with patient and discussed importance of medication adherence . Discussed plans with patient for ongoing care management follow up and provided patient with direct contact information for care management team . Reviewed scheduled/upcoming provider appointments including: Matthew Lynch 02/25/20 at 1030 am/ Dr frank 02/29/20 at 150 pm. . Advised patient, providing education and rationale, to check cbg at least once daily since he has not been checking at all and record, calling the office  for findings outside established parameters.    . Referral made to pharmacy team for assistance with Lantus and Tradjenta . Review of patient status, including review of consultants reports, relevant laboratory and other test results, and medications completed. . Left educational material with Matthew Lynch for meeting on 02-25-20  . Discussed diet and preparing meals  Patient Self Care Activities:  . UNABLE to independently self manage diabetes . Self administers oral medications as prescribed  Initial goal documentation        Matthew Lynch was given information about Care Management services today including:  1. Care Management services include personalized support from designated clinical staff supervised by his physician, including individualized plan of care and coordination with other care providers 2. 24/7 contact phone numbers for assistance for urgent and routine care needs. 3. The patient may stop CCM services at any time (effective at the end of the month) by phone call to the office staff.  Patient agreed to services and verbal consent obtained.   The patient verbalized understanding of instructions provided today and declined a print copy of patient instruction materials.   The care management team will reach out to the patient again over the next 14 days.  The patient has been provided with contact information for the care management team and has been advised to call with any health related questions or concerns.   [name] RN, BSN, [facility] Care Management Coordinator [facility] Phone: [phone] Fax: [phone]

## 2020-02-20 NOTE — Chronic Care Management (AMB) (Signed)
Care Management   Initial Visit Note  02/20/2020 Name: Duff Pozzi MRN: [id number] DOB: Oct 26, 1970  Subjective:   Objective:  Assessment: Davide Risdon is a 49 y.o. year old male who sees [name], MD for primary care. The care management team was consulted for assistance with caremanagement and care coordination needs related to Disease Management Educational Needs Care Coordination. For DM II and HDL.  Review of patient status, including review of consultants reports, relevant laboratory and other test results, and collaboration with appropriate care team members and the patient's provider was performed as part of comprehensive patient evaluation and provision of care management services.    SDOH (Social Determinants of Health) assessments performed: Yes See Care Plan activities for detailed interventions related to [facility])     Outpatient Encounter Medications as of 02/20/2020  Medication Sig Note  . Blood Glucose Monitoring Suppl (ONETOUCH VERIO) w/Device KIT 1 each by Does not apply route daily.   [name] FLUoxetine (PROZAC) 20 MG tablet Take 1 tablet (20 mg total) by mouth daily.   [name] glucose blood (ONETOUCH VERIO) test strip Check blood sugar 2-3 times daily   . Insulin Pen Needle 29G X 12.7MM MISC 1 Stick by Does not apply route daily.   . metFORMIN (FORTAMET) 1000 MG (OSM) 24 hr tablet Take 1 tablet (1,000 mg total) by mouth daily with breakfast. 02/20/2020: Patient states that he takes this bid  . ONETOUCH DELICA LANCETS 73X MISC Check blood sugar 2-3 times daily   . OVER THE COUNTER MEDICATION Take 2 drops by mouth daily. Black seed oil (drops)   . rosuvastatin (CRESTOR) 20 MG tablet Take 1 tablet (20 mg total) by mouth daily.   . insulin glargine (LANTUS) 100 UNIT/ML Solostar Pen Inject 10 Units into the skin daily. (Patient not taking: Reported on 02/20/2020) 02/20/2020: Cant afford  . linagliptin (TRADJENTA) 5 MG TABS tablet Take 1 tablet (5 mg total) by mouth daily. (Patient not  taking: Reported on 02/20/2020) 02/20/2020: Can't afford this medication   No facility-administered encounter medications on file as of 02/20/2020.    Goals Addressed              This Visit's Progress   .  I am tired all the time (pt-stated)        CARE PLAN ENTRY (see longtitudinal plan of care for additional care plan information)  Objective:  Lab Results  Component Value Date   HGBA1C 14.2 (A) 01/24/2020 .   Lab Results  Component Value Date   CREATININE 0.98 01/29/2020   CREATININE 0.78 01/26/2020   CREATININE 0.87 01/25/2020 .   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  He does not eat the correct things because all he has around him is food at the job evetrhing else is closed.  He does not check his blood sugars as he should. . Knowledge Deficits related to medications used for management of diabetes . Difficulty obtaining or cannot afford medications . Financial Constraints  Case Manager Clinical Goal(s):  Over the next 90 days, patient will demonstrate improved adherence to prescribed treatment plan for diabetes self care/management as evidenced by: lowering his a1c by 1-2 points . daily monitoring and recording of CBG  . adherence to prescribed medication regimen  Interventions:  . Provided  education to patient about basic DM disease process . Reviewed medications with patient and discussed importance of medication adherence . Discussed plans with patient for ongoing care management follow up and provided patient with direct contact information for care management team . Reviewed scheduled/upcoming provider appointments including: Nicholas Lose 02/25/20 at 38 am/ Dr frank 02/29/20 at 150 pm. . Advised patient, providing education and rationale, to check cbg at  least once daily since he has not been checking at all and record, calling the office  for findings outside established parameters.   . Referral made to pharmacy team for assistance with Lantus and Tradjenta . Review of patient status, including review of consultants reports, relevant laboratory and other test results, and medications completed. . Left educational material with Nicholas Lose for meeting on 02-25-20  . Discussed diet and preparing meals  Patient Self Care Activities:  . UNABLE to independently self manage diabetes . Self administers oral medications as prescribed  Initial goal documentation         Follow up plan:  The care management team will reach out to the patient again over the next 14 days.  The patient has been provided with contact information for the care management team and has been advised to call with any health related questions or concerns.   Mr. Mazzola was given information about Care Management services today including:  1. Care Management services include personalized support from designated clinical staff supervised by a physician, including individualized plan of care and coordination with other care providers 2. 24/7 contact phone numbers for assistance for urgent and routine care needs. 3. The patient may stop Care Management services at any time (effective at the end of the month) by phone call to the office staff.  Patient agreed to services and verbal consent obtained.  Lazaro Arms RN, BSN, [facility] Care Management Coordinator [place] Phone: [phone] Fax: [phone]

## 2020-02-25 ENCOUNTER — Ambulatory Visit: Payer: BC Managed Care – PPO | Admitting: Pharmacist

## 2020-02-28 ENCOUNTER — Telehealth: Payer: Self-pay | Admitting: Pharmacist

## 2020-02-28 ENCOUNTER — Ambulatory Visit: Payer: BC Managed Care – PPO | Admitting: Pharmacist

## 2020-02-28 NOTE — Telephone Encounter (Signed)
Called patient on 02/28/2020 at 2:13 PM   Contacted [facility] pharmacy (patient's preferred local pharmacy)  Pharmacy staff member stated Lantus copay ~$80    Provided copay card information  Pharmacy staff member billed insurance then copay card. Confirmed copay would be $0 for 30-day supply.  Thank you for involving pharmacy to assist in providing this patient's care.   Zachery Conch, PharmD PGY2 Ambulatory Care Pharmacy Resident

## 2020-02-29 ENCOUNTER — Ambulatory Visit: Payer: BC Managed Care – PPO | Admitting: Family Medicine

## 2020-03-05 ENCOUNTER — Other Ambulatory Visit: Payer: Self-pay

## 2020-03-05 ENCOUNTER — Ambulatory Visit: Payer: BC Managed Care – PPO

## 2020-03-05 NOTE — Chronic Care Management (AMB) (Signed)
  Chronic Care Management   Note  03/05/2020 Name: Matthew Lynch MRN: [id number] DOB: May 29, 1971  RNCM was able to reach out to the patient but he was unable to talk.  He stated that he was at work and that they had changed his schedule from working at night to in the day.  I caused him to miss both of his appointments that he had scheduled.  He stated that he would call us back to reschedule his appointments at the office.   Follow up plan: The care management team will reach out to the patient again over the next 5-7 days.  The patient has been provided with contact information for the care management team and has been advised to call with any health related questions or concerns.    [name] RN, BSN, [facility] Care Management Coordinator [facility] Phone: [phone] Fax: [phone]

## 2020-03-14 ENCOUNTER — Other Ambulatory Visit: Payer: Self-pay

## 2020-03-14 ENCOUNTER — Ambulatory Visit: Payer: BC Managed Care – PPO

## 2020-03-14 ENCOUNTER — Ambulatory Visit: Payer: Self-pay

## 2020-03-14 DIAGNOSIS — Z139 Encounter for screening, unspecified: Secondary | ICD-10-CM

## 2020-03-14 DIAGNOSIS — Z789 Other specified health status: Secondary | ICD-10-CM

## 2020-03-14 NOTE — Chronic Care Management (AMB) (Signed)
°  Care Management   Outreach Note  03/14/2020 Name: Matthew Lynch MRN: [id number] DOB: March 16, 1971  Referred by: [name], MD Reason for referral : Chronic Care Management (DM II 2nd attempt)   A second unsuccessful telephone outreach was attempted today. The patient was referred to the case management team for assistance with care management and care coordination.   Follow Up Plan: The care management team will reach out to the patient again over the next 7-14 days.   RNCM unable to leave the patient a message because there is a message that states there are restrictions placed on this line.  RNCM will mail the patient a letter asking him to contact me.  [name] RN, BSN, [facility] Care Management Coordinator [facility] Phone: [phone] Fax: [phone]

## 2020-03-14 NOTE — Chronic Care Management (AMB) (Signed)
Care Management   Follow Up Note   03/14/2020 Name: Matthew Lynch MRN: [id number] DOB: 08/28/1971  Referred by: [name], MD Reason for referral : Chronic Care Management (DM II)   Matthew Lynch is a 49 y.o. year old male who is a primary care patient of [name], MD. The care management team was consulted for assistance with care management and care coordination needs.    Review of patient status, including review of consultants reports, relevant laboratory and other test results, and collaboration with appropriate care team members and the patient's provider was performed as part of comprehensive patient evaluation and provision of chronic care management services.    SDOH (Social Determinants of Health) assessments performed: Yes See Care Plan activities for detailed interventions related to [facility])     Advanced Directives: See Care Plan and [employer] application for related entries.   Goals Addressed              This Visit's Progress   .  I am tired all the time (pt-stated)        CARE PLAN ENTRY (see longtitudinal plan of care for additional care plan information)  Objective:  Lab Results  Component Value Date   HGBA1C 14.2 (A) 01/24/2020 .   Lab Results  Component Value Date   CREATININE 0.98 01/29/2020   CREATININE 0.78 01/26/2020   CREATININE 0.87 01/25/2020 .   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  He does not eat the correct things because all he has around him is food at the job evetrhing else is closed.  He does not check his blood sugars as he should. . Knowledge Deficits related to medications used for management of diabetes . Difficulty obtaining or cannot afford medications . Financial Constraints  Case Manager Clinical Goal(s):    Over the next 90 days, patient will demonstrate improved adherence to prescribed treatment plan for diabetes self care/management as evidenced by: lowering his a1c by 1-2 points . daily monitoring and recording of CBG  . adherence to prescribed medication regimen  Interventions:  . Provided education to patient about basic DM disease process . Reviewed medications with patient and discussed importance of medication adherence . Discussed plans with patient for ongoing care management follow up and provided patient with direct contact information for care management team . Reviewed scheduled/upcoming provider appointments including: [name] 02/25/20 at 1030 am/ Dr frank 02/29/20 at 150 pm. . Advised patient, providing education and rationale, to check cbg at least once daily since he has not been checking at all and record, calling the office  for findings outside established parameters.   . Referral made to pharmacy team for assistance with Lantus and Tradjenta . Review of patient status, including review of consultants reports, relevant laboratory and other test results, and medications completed. . 03/14/20 . Patient called in and stated that his schedule has changed he no longer works at night his shift is in the day now.  He apologized for missing his appointments.  He states that he in not able to pay for the co pay to come in. Matthew Lynch He is checking his blood sugar in the morning.  This morning it was 471. Matthew Lynch He is eating twice daily but know he is not eating the right  foods . He states that he has not gotten the information that I mailed to him because he has not picked up his mail but he will. Matthew Lynch He states that he spoke with a pharmacy representative and she was very helpful. . I am going to follow up with him on Monday to see if we can get him samples of his Lantus and he needs information for the [facility] card.  Also sent referral to care guides for help with housing.  Patient Self Care  Activities:  . UNABLE to independently self manage diabetes . Self administers oral medications as prescribed  Please see past updates related to this goal by clicking on the "Past Updates" button in the selected goal          The care management team will reach out to the patient again over the next 10 days.   Matthew Fairly RN, BSN, [facility] Care Management Coordinator [facility] Phone: [phone] Fax: [phone]

## 2020-03-14 NOTE — Patient Instructions (Signed)
Visit Information  Goals Addressed              This Visit's Progress   .  I am tired all the time (pt-stated)        CARE PLAN ENTRY (see longtitudinal plan of care for additional care plan information)  Objective:  Lab Results  Component Value Date   HGBA1C 14.2 (A) 01/24/2020 .   Lab Results  Component Value Date   CREATININE 0.98 01/29/2020   CREATININE 0.78 01/26/2020   CREATININE 0.87 01/25/2020 .   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  He does not eat the correct things because all he has around him is food at the job evetrhing else is closed.  He does not check his blood sugars as he should. . Knowledge Deficits related to medications used for management of diabetes . Difficulty obtaining or cannot afford medications . Financial Constraints  Case Manager Clinical Goal(s):  Over the next 90 days, patient will demonstrate improved adherence to prescribed treatment plan for diabetes self care/management as evidenced by: lowering his a1c by 1-2 points . daily monitoring and recording of CBG  . adherence to prescribed medication regimen  Interventions:  . Provided education to patient about basic DM disease process . Reviewed medications with patient and discussed importance of medication adherence . Discussed plans with patient for ongoing care management follow up and provided patient with direct contact information for care management team . Reviewed scheduled/upcoming provider appointments including: [name] 02/25/20 at 1030 am/ Dr frank 02/29/20 at 150 pm. . Advised patient, providing education and rationale, to check cbg at least once daily since he has not been checking at all and record, calling the office  for findings outside established parameters.    . Referral made to pharmacy team for assistance with Lantus and Tradjenta . Review of patient status, including review of consultants reports, relevant laboratory and other test results, and medications completed. . 03/14/20 . Patient called in and stated that his schedule has changed he no longer works at night his shift is in the day now.  He apologized for missing his appointments.  He states that he in not able to pay for the co pay to come in. [name] He is checking his blood sugar in the morning.  This morning it was 471. [name] He is eating twice daily but know he is not eating the right foods . He states that he has not gotten the information that I mailed to him because he has not picked up his mail but he will. [name] He states that he spoke with a pharmacy representative and she was very helpful. . I am going to follow up with him on Monday to see if we can get him samples of his Lantus and he needs information for the [facility] card.  Also sent referral to care guides for help with housing.  Patient Self Care Activities:  . UNABLE to independently self manage diabetes . Self administers oral medications as prescribed  Please see past updates related to this goal by clicking on the "Past Updates" button in the selected goal         Matthew Lynch was given information about Care Management services today including:  1. Care Management services include  personalized support from designated clinical staff supervised by his physician, including individualized plan of care and coordination with other care providers 2. 24/7 contact phone numbers for assistance for urgent and routine care needs. 3. The patient may stop CCM services at any time (effective at the end of the month) by phone call to the office staff.  Patient agreed to services and verbal consent obtained.   The patient verbalized understanding of instructions provided today and declined a print copy of patient instruction materials.   The  care management team will reach out to the patient again over the next 10 days.   Matthew Fairly RN, BSN, [facility] Care Management Coordinator [facility] Phone: [phone] Fax: [phone]

## 2020-03-17 ENCOUNTER — Ambulatory Visit: Payer: Self-pay

## 2020-03-17 NOTE — Chronic Care Management (AMB) (Signed)
  Chronic Care Management   Note  03/17/2020 Name: Matthew Lynch MRN: [id number] DOB: 1971-07-10  RNCM Called patient to notify him that an appointment was made for him with [name] Pharm D at 9 am on 03/18/20 for samples and discussion on insulin.  It was explained to him that the co-pay would be waved this one time and he needs to show up for the appointment.  He verbalized understanding.  He stated that he went by his mothers and picked up the educational information that I sent to him.  Follow up plan:  RNCM will up with the patient at the next scheduled interval.   [name] RN, BSN, [facility] Care Management Coordinator [facility] Phone: [phone] Fax: [phone]

## 2020-03-18 ENCOUNTER — Ambulatory Visit: Payer: BC Managed Care – PPO | Admitting: Pharmacist

## 2020-03-18 ENCOUNTER — Telehealth: Payer: Self-pay

## 2020-03-18 ENCOUNTER — Ambulatory Visit: Payer: BC Managed Care – PPO | Admitting: Licensed Clinical Social Worker

## 2020-03-18 ENCOUNTER — Ambulatory Visit: Payer: Self-pay | Admitting: Licensed Clinical Social Worker

## 2020-03-18 DIAGNOSIS — F439 Reaction to severe stress, unspecified: Secondary | ICD-10-CM

## 2020-03-18 DIAGNOSIS — Z7189 Other specified counseling: Secondary | ICD-10-CM

## 2020-03-18 NOTE — Chronic Care Management (AMB) (Signed)
Care Management   Clinical Social Work initial Note  03/18/2020 Name: Matthew Lynch MRN: [id number] DOB: 04-17-1971 Matthew Lynch is a 49 y.o. year old male who sees [name], MD for primary care. The Care Management team was consulted to assist the patient with [place] and Resources as well as community resources.  LCSW reached out to [name] today by phone to introduce self, assess needs and barriers to care.   Assessment: Patient is pleasant and engaged in conversation. See care plan below       Plan: CCM will continue to follow patient for ongoing needs.  CCM RN has F/U appointment with patient 1 week.  Will continue to collaborate with CCM RN for ongoing needs  Review of patient status, including review of consultants reports, relevant laboratory and other test results, and collaboration with appropriate care team members and the patient's provider was performed as part of comprehensive patient evaluation and provision of chronic care management services.    Advance Directive Status:  Not addressed in this encounter SDOH (Social Determinants of Health) assessments performed: Yes:   SDOH Interventions     Most Recent Value  SDOH Interventions  SDOH Interventions for the Following Domains Depression, Stress  Stress Interventions Provide Counseling  [EMMI educational information relieving stress]  Depression Interventions/Treatment  Counseling  [facility] referral for counseling]      ; Goals Addressed            This Visit's Progress   . Connect with Counseling       CARE PLAN ENTRY (see longitudinal plan of care for additional care plan information)  Current Barriers:  . Patient with Depression acknowledges deficits with connecting to mental health provider for ongoing counseling.  . Patient is experiencing symptoms of  depression which seem to be exacerbated by psychosocial stressors and uncontrolled diabetes .     [name] Patient needs Support, Education,  and Care Coordination in order to meet unmet mental health needs  Clinical Social Work Goal(s):  [name] Over the next 30 days, patient will connect for ongoing counseling.  . Patient will implement clinical interventions discussed today to decreases symptoms of depression and stress and increase knowledge and/or ability of: coping skills. Interventions:  . Assessed patient's  previous treatment, needs and barriers to care . Provided basic mental health support, education and interventions  . Collaborated with CCM RN regarding patient needs . Discussed several options for long term counseling based on need and insurance. Assisted patient with narrowing the options down to [facility] [phone] ) . Counseling referral  placed via [email or web address] . Other interventions include: EMMI education on Relieving stress) Patient Self Care Activities & Deficits:  . Patient is unable to independently navigate community resource options without care coordination support . Patient is able to implement clinical interventions discussed today and is motivated for treatment  . Patient will F/U with [facility] to schedule an appointment if no call is received in 5 to 7 days . Patient is motivated for treatment Initial goal documentation       Outpatient Encounter Medications as of 03/18/2020  Medication Sig Note  . Blood Glucose Monitoring Suppl (ONETOUCH VERIO) w/Device KIT 1 each by Does not apply route daily.   [name] FLUoxetine (PROZAC) 20 MG tablet Take 1 tablet (20 mg total) by mouth daily.   [name] glucose blood (ONETOUCH VERIO) test strip Check blood sugar 2-3 times daily   . insulin glargine (LANTUS) 100 UNIT/ML Solostar  Pen Inject 10 Units into the skin daily. (Patient not taking: Reported on 02/20/2020) 02/20/2020: Cant afford  . Insulin Pen Needle 29G X 12.7MM MISC 1 Stick by Does not apply route daily.   . linagliptin (TRADJENTA) 5 MG TABS tablet Take 1 tablet (5 mg total) by  mouth daily. (Patient not taking: Reported on 02/20/2020) 02/20/2020: Can't afford this medication  . metFORMIN (FORTAMET) 1000 MG (OSM) 24 hr tablet Take 1 tablet (1,000 mg total) by mouth daily with breakfast. 02/20/2020: Patient states that he takes this bid  . ONETOUCH DELICA LANCETS 33G MISC Check blood sugar 2-3 times daily   . OVER THE COUNTER MEDICATION Take 2 drops by mouth daily. Black seed oil (drops)   . rosuvastatin (CRESTOR) 20 MG tablet Take 1 tablet (20 mg total) by mouth daily.    No facility-administered encounter medications on file as of 03/18/2020.       Deborah Moore, LCSW Care Management & Coordination  [facility]   [phone] 11:02 AM       

## 2020-03-18 NOTE — Chronic Care Management (AMB) (Signed)
   Social Work  Care Management Collaboration 03/18/2020 Name: Axle Parfait MRN: [id number] DOB: March 23, 1971 Joshva Labreck is a 49 y.o. year old male who sees [name], MD for primary care. LCSW was consulted by [facility] pharmacy to assistance patient with housing resources.  Recommendation: After collaboration with CCM RN care manager it is determined that patient may benefit from referral to CCM community careguide Intervention: Patient was not interviewed or contacted during this encounter.  LCSW collaborated with CCM RN.  CCM RN placed urgent referral to CCM careguide for housing resources Plan LCSW will reach out to patient to see if he would like resources for counseling.  Review of patient status, including review of consultants reports, relevant laboratory and other test results, and collaboration with appropriate care team members and the patient's provider was performed as part of comprehensive patient evaluation and provision of chronic care management services.     Sammuel Hines, LCSW Care Management & Coordination  [facility]   [phone] 10:41 AM

## 2020-03-18 NOTE — Telephone Encounter (Signed)
03/18/20 Spoke with patient about housing resources emailed information to [email or web address] with patient's permission for [employer], housing listing on [place] with housing listings for [place] area and [employer] application.  Will follow-up with patient later this week. Olean Ree [phone]

## 2020-03-19 ENCOUNTER — Other Ambulatory Visit: Payer: Self-pay | Admitting: Family Medicine

## 2020-03-21 ENCOUNTER — Encounter (HOSPITAL_BASED_OUTPATIENT_CLINIC_OR_DEPARTMENT_OTHER): Payer: Self-pay

## 2020-03-21 ENCOUNTER — Other Ambulatory Visit: Payer: Self-pay

## 2020-03-21 ENCOUNTER — Emergency Department (HOSPITAL_BASED_OUTPATIENT_CLINIC_OR_DEPARTMENT_OTHER)
Admission: EM | Admit: 2020-03-21 | Discharge: 2020-03-21 | Disposition: A | Discharge status: Home / Self Care | Payer: BC Managed Care – PPO | Attending: Emergency Medicine | Admitting: Emergency Medicine

## 2020-03-21 DIAGNOSIS — F17210 Nicotine dependence, cigarettes, uncomplicated: Secondary | ICD-10-CM | POA: Insufficient documentation

## 2020-03-21 DIAGNOSIS — Z794 Long term (current) use of insulin: Secondary | ICD-10-CM | POA: Diagnosis not present

## 2020-03-21 DIAGNOSIS — R739 Hyperglycemia, unspecified: Secondary | ICD-10-CM

## 2020-03-21 DIAGNOSIS — E1165 Type 2 diabetes mellitus with hyperglycemia: Secondary | ICD-10-CM | POA: Diagnosis not present

## 2020-03-21 LAB — CBC
HCT: 44.6 % (ref 39.0–52.0)
Hemoglobin: 15.3 g/dL (ref 13.0–17.0)
MCH: 29.9 pg (ref 26.0–34.0)
MCHC: 34.3 g/dL (ref 30.0–36.0)
MCV: 87.3 fL (ref 80.0–100.0)
Platelets: 314 10*3/uL (ref 150–400)
RBC: 5.11 MIL/uL (ref 4.22–5.81)
RDW: 11.8 % (ref 11.5–15.5)
WBC: 8.8 10*3/uL (ref 4.0–10.5)
nRBC: 0 % (ref 0.0–0.2)

## 2020-03-21 LAB — BASIC METABOLIC PANEL
Anion gap: 13 (ref 5–15)
BUN: 14 mg/dL (ref 6–20)
CO2: 26 mmol/L (ref 22–32)
Calcium: 10 mg/dL (ref 8.9–10.3)
Chloride: 93 mmol/L — ABNORMAL LOW (ref 98–111)
Creatinine, Ser: 1.02 mg/dL (ref 0.61–1.24)
GFR calc Af Amer: >60 mL/min (ref >60)
GFR calc non Af Amer: >60 mL/min (ref >60)
Glucose, Bld: 560 mg/dL (ref 70–99)
Potassium: 4.8 mmol/L (ref 3.5–5.1)
Sodium: 132 mmol/L — ABNORMAL LOW (ref 135–145)

## 2020-03-21 LAB — URINALYSIS, ROUTINE W REFLEX MICROSCOPIC
Bilirubin Urine: NEGATIVE
Glucose, UA: >=500 mg/dL — AB
Hgb urine dipstick: NEGATIVE
Ketones, ur: NEGATIVE mg/dL
Leukocytes,Ua: NEGATIVE
Nitrite: NEGATIVE
Protein, ur: NEGATIVE mg/dL
Specific Gravity, Urine: 1.01 (ref 1.005–1.030)
pH: 6 (ref 5.0–8.0)

## 2020-03-21 LAB — URINALYSIS, MICROSCOPIC (REFLEX)

## 2020-03-21 LAB — CBG MONITORING, ED
Glucose-Capillary: 508 mg/dL (ref 70–99)
Glucose-Capillary: 154 mg/dL — ABNORMAL HIGH (ref 70–99)

## 2020-03-21 MED ORDER — INSULIN ASPART PROT & ASPART (70-30 MIX) 100 UNIT/ML ~~LOC~~ SUSP
10.0000 [IU] | Freq: Once | SUBCUTANEOUS | Status: AC
  Administered 2020-03-21: 10 [IU] via SUBCUTANEOUS
  Filled 2020-03-21: qty 10

## 2020-03-21 MED ORDER — SODIUM CHLORIDE 0.9 % IV BOLUS: 1000.0000 mL | Freq: Once | INTRAVENOUS | Status: DC

## 2020-03-21 MED ORDER — SODIUM CHLORIDE 0.9 % IV BOLUS
2000.0000 mL | Freq: Once | INTRAVENOUS | Status: AC
  Administered 2020-03-21: 2000 mL via INTRAVENOUS

## 2020-03-21 NOTE — ED Provider Notes (Addendum)
[facility] EMERGENCY DEPARTMENT Provider Note   CSN: [id number] Arrival date & time: 03/21/20  2012     History Chief Complaint  Patient presents with   Hyperglycemia    Matthew Lynch is a 49 y.o. male.  The history is provided by the patient.  Hyperglycemia Blood sugar level PTA:  >500 Severity:  Mild Onset quality:  Gradual Timing:  Constant Progression:  Unchanged Chronicity:  Recurrent Current diabetic therapy:  Patient just started on insulin.  Was having difficulty affording the medication.  Social work has helped him obtain this medicine.  He got yesterday but did not take it today because his blood sugars are high. Context: noncompliance   Relieved by:  Nothing Ineffective treatments:  None tried Associated symptoms: no abdominal pain, no altered mental status, no blurred vision, no chest pain, no confusion, no dehydration, no diaphoresis, no dizziness, no dysuria, no fatigue, no fever, no increased appetite, no increased thirst, no malaise, no nausea, no polyuria, no shortness of breath, no syncope, no vomiting, no weakness and no weight change        Past Medical History:  Diagnosis Date   Diabetes mellitus without complication [facility])     Patient Active Problem List   Diagnosis Date Noted   Depression, major, single episode, moderate ([place]) 01/29/2020   Hyperglycemia    Hyperlipidemia    Tobacco abuse    Anxiety    Type 2 diabetes mellitus with hyperglycemia ([place]) 01/24/2020   Type 2 diabetes mellitus ([place]) 01/24/2020   Hyperlipidemia associated with type 2 diabetes mellitus ([place]) 01/25/2019   Chronic bilateral low back pain without sciatica 08/24/2018   Diabetes mellitus ([place]) 02/01/2018    History reviewed. No pertinent surgical history.     No family history on file.  Social History   Tobacco Use   Smoking status: Current Every Day Smoker    Types: Cigarettes   Smokeless tobacco: Never Used  Vaping Use   Vaping  Use: Never used  Substance Use Topics   Alcohol use: Never   Drug use: Never    Home Medications Prior to Admission medications   Medication Sig Start Date End Date Taking? Authorizing Provider  Blood Glucose Monitoring Suppl (ONETOUCH VERIO) w/Device KIT 1 each by Does not apply route daily. 02/03/18   Mayo, Pete Pelt, MD  FLUoxetine (PROZAC) 20 MG tablet Take 1 tablet (20 mg total) by mouth daily. 01/29/20   [name], MD  glucose blood [facility] VERIO) test strip Check blood sugar 2-3 times daily 02/03/18   Mayo, Pete Pelt, MD  insulin glargine (LANTUS) 100 UNIT/ML Solostar Pen Inject 10 Units into the skin daily. Patient not taking: Reported on 02/20/2020 01/26/20   Lyndee Hensen, DO  Insulin Pen Needle 29G X 12.7MM MISC 1 Stick by Does not apply route daily. 01/26/20   Wilber Oliphant, MD  linagliptin (TRADJENTA) 5 MG TABS tablet Take 1 tablet (5 mg total) by mouth daily. Patient not taking: Reported on 02/20/2020 01/27/20   Wilber Oliphant, MD  metFORMIN (GLUMETZA) 1000 MG (MOD) 24 hr tablet Take 1 tablet (1,000 mg total) by mouth daily with breakfast. 03/20/20   [name], MD  [facility] DELICA LANCETS 33A MISC Check blood sugar 2-3 times daily 02/03/18   Mayo, Pete Pelt, MD  OVER THE COUNTER MEDICATION Take 2 drops by mouth daily. Black seed oil (drops)    [provider]  rosuvastatin (CRESTOR) 20 MG tablet Take 1 tablet (20 mg total) by mouth daily.  01/25/19   Marjie Skiff, MD    Allergies    Patient has no known allergies.  Review of Systems   Review of Systems  Constitutional: Negative for chills, diaphoresis, fatigue and fever.  HENT: Negative for ear pain and sore throat.   Eyes: Negative for blurred vision, pain and visual disturbance.  Respiratory: Negative for cough and shortness of breath.   Cardiovascular: Negative for chest pain, palpitations and syncope.  Gastrointestinal: Negative for abdominal pain, nausea and vomiting.  Endocrine: Negative for polydipsia  and polyuria.  Genitourinary: Negative for dysuria and hematuria.  Musculoskeletal: Negative for arthralgias and back pain.  Skin: Negative for color change and rash.  Neurological: Negative for dizziness, seizures, syncope and weakness.  Psychiatric/Behavioral: Negative for confusion.  All other systems reviewed and are negative.   Physical Exam Updated Vital Signs  ED Triage Vitals  Enc Vitals Group     BP 03/21/20 2017 122/79     Pulse Rate 03/21/20 2017 94     Resp 03/21/20 2017 16     Temp 03/21/20 2017 99.1 F (37.3 C)     Temp Source 03/21/20 2017 Oral     SpO2 03/21/20 2017 96 %     Weight 03/21/20 2018 105 lb (47.6 kg)     Height 03/21/20 2018 5' 4"  (1.626 m)     Head Circumference --      Peak Flow --      Pain Score 03/21/20 2018 8     Pain Loc --      Pain Edu? --      Excl. in [place]? --     Physical Exam Vitals and nursing note reviewed.  Constitutional:      General: He is not in acute distress.    Appearance: He is well-developed. He is not ill-appearing.  HENT:     Head: Normocephalic and atraumatic.     Nose: Nose normal.     Mouth/Throat:     Mouth: Mucous membranes are moist.  Eyes:     Extraocular Movements: Extraocular movements intact.     Conjunctiva/sclera: Conjunctivae normal.     Pupils: Pupils are equal, round, and reactive to light.  Cardiovascular:     Rate and Rhythm: Normal rate and regular rhythm.     Pulses: Normal pulses.     Heart sounds: Normal heart sounds. No murmur heard.   Pulmonary:     Effort: Pulmonary effort is normal. No respiratory distress.     Breath sounds: Normal breath sounds.  Abdominal:     Palpations: Abdomen is soft.     Tenderness: There is no abdominal tenderness.  Musculoskeletal:     Cervical back: Normal range of motion and neck supple.  Skin:    General: Skin is warm and dry.     Capillary Refill: Capillary refill takes less than 2 seconds.  Neurological:     General: No focal deficit present.      Mental Status: He is alert.  Psychiatric:        Mood and Affect: Mood normal.     ED Results / Procedures / Treatments   Labs (all labs ordered are listed, but only abnormal results are displayed) Labs Reviewed  BASIC METABOLIC PANEL - Abnormal; Notable for the following components:      Result Value   Sodium 132 (*)    Chloride 93 (*)    Glucose, Bld 560 (*)    All other components within normal limits  URINALYSIS, ROUTINE  W REFLEX MICROSCOPIC - Abnormal; Notable for the following components:   Glucose, UA >=500 (*)    All other components within normal limits  URINALYSIS, MICROSCOPIC (REFLEX) - Abnormal; Notable for the following components:   Bacteria, UA RARE (*)    All other components within normal limits  CBG MONITORING, ED - Abnormal; Notable for the following components:   Glucose-Capillary 508 (*)    All other components within normal limits  CBC  CBG MONITORING, ED    EKG None  Radiology No results found.  Procedures .Critical Care Performed by: Lennice Sites, DO Authorized by: Lennice Sites, DO   Critical care provider statement:    Critical care time (minutes):  35   Critical care was necessary to treat or prevent imminent or life-threatening deterioration of the following conditions:  Metabolic crisis and endocrine crisis   Critical care was time spent personally by me on the following activities:  Blood draw for specimens, evaluation of patient's response to treatment, development of treatment plan with patient or surrogate, obtaining history from patient or surrogate, ordering and review of laboratory studies, ordering and performing treatments and interventions, pulse oximetry, re-evaluation of patient's condition and review of old charts   I assumed direction of critical care for this patient from another provider in my specialty: no     (including critical care time)  Medications Ordered in ED Medications  insulin aspart protamine- aspart  (NOVOLOG MIX 70/30) injection 10 Units (10 Units Subcutaneous Given 03/21/20 2223)  sodium chloride 0.9 % bolus 2,000 mL ( Intravenous Rate/Dose Verify 03/21/20 2304)    ED Course  I have reviewed the triage vital signs and the nursing notes.  Pertinent labs & imaging results that were available during my care of the patient were reviewed by me and considered in my medical decision making (see chart for details).    MDM Rules/Calculators/A&P                          Verne Lanuza is a 50 year old male with history of diabetes who presents the ED with hyperglycemia.  Patient with normal vitals.  No fever.  Patient is on Metformin.  Was just started on insulin but was unable to afford the medication.  Social work has helped him obtain this medicine which she states he got yesterday but because his blood sugar is high he wanted to be evaluated.  He has not taken any of the insulin.  He denies any nausea, vomiting, abdominal pain.  Overall he appears well.  Blood work shows a blood sugar of 560 however bicarb is normal.  No anion gap.  No ketones in the urine.  Overall patient just with hyperglycemia.  Mentation is normal.  Will give 2 L of IV fluids, will give 10 units of insulin.  Patient given further education.  Anticipate discharge after IV fluids.  Blood sugar has improved after insulin and IV fluids.  Recommend continued management at home with insulin that he now has.  Understands return precautions.  This chart was dictated using voice recognition software.  Despite best efforts to proofread,  errors can occur which can change the documentation meaning.    Final Clinical Impression(s) / ED Diagnoses Final diagnoses:  Hyperglycemia    Rx / DC Orders ED Discharge Orders    None       Lennice Sites, DO 03/21/20 [place], [place], DO 04/10/20 1108

## 2020-03-21 NOTE — Telephone Encounter (Signed)
03/21/20 Follow-up call.  Left message for patient to return my call about housing resources provided. Olean Ree [phone]

## 2020-03-21 NOTE — ED Triage Notes (Signed)
Pt c/o hyperglycemia and states his BGL wat 562 at home. Pt c/o weakness, fatigue, myalgias, HA, blurred vision, abd pain, diarrhea today.

## 2020-03-24 ENCOUNTER — Telehealth: Payer: Self-pay

## 2020-03-24 ENCOUNTER — Ambulatory Visit: Payer: BC Managed Care – PPO

## 2020-03-24 ENCOUNTER — Encounter (HOSPITAL_COMMUNITY): Payer: Self-pay | Admitting: *Deleted

## 2020-03-24 ENCOUNTER — Other Ambulatory Visit: Payer: Self-pay

## 2020-03-24 ENCOUNTER — Emergency Department (HOSPITAL_COMMUNITY)
Admission: EM | Admit: 2020-03-24 | Discharge: 2020-03-25 | Disposition: A | Discharge status: Home / Self Care | Payer: BC Managed Care – PPO | Attending: Emergency Medicine | Admitting: Emergency Medicine

## 2020-03-24 DIAGNOSIS — F17210 Nicotine dependence, cigarettes, uncomplicated: Secondary | ICD-10-CM | POA: Diagnosis not present

## 2020-03-24 DIAGNOSIS — R739 Hyperglycemia, unspecified: Secondary | ICD-10-CM | POA: Diagnosis not present

## 2020-03-24 DIAGNOSIS — Z7984 Long term (current) use of oral hypoglycemic drugs: Secondary | ICD-10-CM | POA: Insufficient documentation

## 2020-03-24 DIAGNOSIS — Z79899 Other long term (current) drug therapy: Secondary | ICD-10-CM | POA: Insufficient documentation

## 2020-03-24 DIAGNOSIS — E1165 Type 2 diabetes mellitus with hyperglycemia: Secondary | ICD-10-CM | POA: Diagnosis not present

## 2020-03-24 LAB — CBC
HCT: 45.1 % (ref 39.0–52.0)
Hemoglobin: 15.2 g/dL (ref 13.0–17.0)
MCH: 29.6 pg (ref 26.0–34.0)
MCHC: 33.7 g/dL (ref 30.0–36.0)
MCV: 87.9 fL (ref 80.0–100.0)
Platelets: 313 10*3/uL (ref 150–400)
RBC: 5.13 MIL/uL (ref 4.22–5.81)
RDW: 11.8 % (ref 11.5–15.5)
WBC: 8.7 10*3/uL (ref 4.0–10.5)
nRBC: 0 % (ref 0.0–0.2)

## 2020-03-24 LAB — CBG MONITORING, ED
Glucose-Capillary: 584 mg/dL (ref 70–99)
Glucose-Capillary: 573 mg/dL (ref 70–99)

## 2020-03-24 LAB — BASIC METABOLIC PANEL
Anion gap: 10 (ref 5–15)
BUN: 9 mg/dL (ref 6–20)
CO2: 27 mmol/L (ref 22–32)
Calcium: 9.5 mg/dL (ref 8.9–10.3)
Chloride: 95 mmol/L — ABNORMAL LOW (ref 98–111)
Creatinine, Ser: 1.11 mg/dL (ref 0.61–1.24)
GFR calc Af Amer: >60 mL/min (ref >60)
GFR calc non Af Amer: >60 mL/min (ref >60)
Glucose, Bld: 611 mg/dL (ref 70–99)
Potassium: 4.4 mmol/L (ref 3.5–5.1)
Sodium: 132 mmol/L — ABNORMAL LOW (ref 135–145)

## 2020-03-24 LAB — URINALYSIS, ROUTINE W REFLEX MICROSCOPIC
Bacteria, UA: NONE SEEN
Bilirubin Urine: NEGATIVE
Glucose, UA: >=500 mg/dL — AB
Hgb urine dipstick: NEGATIVE
Ketones, ur: NEGATIVE mg/dL
Leukocytes,Ua: NEGATIVE
Nitrite: NEGATIVE
Protein, ur: NEGATIVE mg/dL
Specific Gravity, Urine: 1.027 (ref 1.005–1.030)
pH: 6 (ref 5.0–8.0)

## 2020-03-24 NOTE — Telephone Encounter (Signed)
Patient calls nurse line regarding blood sugar management. Patient reports that he has been having difficulty managing blood sugar over the weekend, with readings in the 400-500s. Patient was seen in the ED on Friday evening, however, patient reports continued issues. Patient is now reporting body soreness, severe weakness, headache, and blurry vision. Patient reports last CBG at 433. Patient advised to proceed to ED/UC for further evaluation. Patient states that he does have someone to drive him, due to his symptoms. Advised patient to keep f/u appointment with PCP later in the week.   FYI to PCP  Veronda Prude, RN

## 2020-03-24 NOTE — ED Notes (Signed)
CBG 584 at triage

## 2020-03-24 NOTE — ED Triage Notes (Signed)
Pt has high blood sugars 400-500s. Pt states this am 400s and was referred to come here from his doctor. Pt last took Lantus 10 units last night.  No insulin today. Pt is reporting leg pain and toe pain and states he has had that a long time.

## 2020-03-24 NOTE — ED Notes (Signed)
cbg 573

## 2020-03-24 NOTE — Telephone Encounter (Signed)
03/24/20 Spoke with patient the has filled out and mailed an application for [employer], and he is filling out an application for [employer].  I also gave hime information about housing in the [facility] area. Patient does not have any other needs right now.  Closing referral. Matthew Lynch [phone]

## 2020-03-24 NOTE — ED Notes (Signed)
Triage RN notified of bs

## 2020-03-25 ENCOUNTER — Telehealth: Payer: Self-pay

## 2020-03-25 LAB — CBG MONITORING, ED
Glucose-Capillary: 290 mg/dL — ABNORMAL HIGH (ref 70–99)
Glucose-Capillary: 182 mg/dL — ABNORMAL HIGH (ref 70–99)

## 2020-03-25 MED ORDER — SODIUM CHLORIDE 0.9 % IV BOLUS
1000.0000 mL | Freq: Once | INTRAVENOUS | Status: AC
  Administered 2020-03-25: 1000 mL via INTRAVENOUS

## 2020-03-25 MED ORDER — INSULIN ASPART 100 UNIT/ML ~~LOC~~ SOLN
5.0000 [IU] | Freq: Once | SUBCUTANEOUS | Status: AC
  Administered 2020-03-25: 5 [IU] via SUBCUTANEOUS

## 2020-03-25 MED ORDER — INSULIN ASPART 100 UNIT/ML ~~LOC~~ SOLN: 10.0000 [IU] | Freq: Once | SUBCUTANEOUS | Status: DC

## 2020-03-25 NOTE — Discharge Instructions (Signed)
It is very important that you continue taking your diabetic medications and follow up closely with your primary care provider for further management of your elevated blood sugar.  Return if you have any concerns

## 2020-03-25 NOTE — Patient Instructions (Signed)
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  He does not eat the correct things because all he has around him is food at the job evetrhing else is closed.  He does not check his blood sugars as he should.  Knowledge Deficits related to medications used for management of diabetes  Difficulty obtaining or cannot afford medications  Financial Constraints  Case Manager Clinical Goal(s):  Over the next 90 days, patient will demonstrate improved adherence to prescribed treatment plan for diabetes self care/management as evidenced by: lowering his a1c by 1-2 points  daily monitoring and recording of CBG   adherence to prescribed medication regimen  Interventions:   Provided education to patient about basic DM disease process  Reviewed medications with patient and discussed importance of medication adherence  Discussed plans with patient for ongoing care management follow up and provided patient with direct contact information for care management team  Reviewed scheduled/upcoming provider appointments including: [name] 02/25/20 at 1030 am/ [name] 02/29/20 at 150 pm.  Advised patient, providing education and rationale, to check cbg at least once daily since he has not been checking at all and record, calling the office  for findings outside established parameters.     Referral made to pharmacy team for assistance with Lantus and Tradjenta  Review of patient status, including review of consultants reports, relevant laboratory and other test results, and medications completed.  03/24/20  Patient stated that he was at the hospital his blood sugars have been running in the 4-5 hundreds  He stated tha he had been feel tired and weak, his vision was blurry.  He was in the ED on 03/21/20 due to his blood sugars being high.  He states that he had received his lantus and tradjenta 5 mg after he left the hospital on the 23 rd and been taking his medications but his blood sugars were still high and he was told to come back if that was the case.    I reminded him that he has an appointment with [name] on 03/27/20 at the office and he verbalized understanding.  Patient Self Care Activities:   UNABLE to independently self manage diabetes  Self administers oral medications as prescribed  Please see past updates related to this goal by clicking on the "Past Updates" button in the selected goal         Mr. Hockett was given information about Care Management services today including:  1. Care Management services include personalized support from designated clinical staff supervised by his physician, including individualized plan of care and coordination with other care providers 2. 24/7 contact phone numbers for assistance for urgent and routine care needs. 3. The patient may stop CCM services  at any time (effective at the end of the month) by phone call to the office staff.  Patient agreed to services and verbal consent obtained.   The patient verbalized understanding of instructions provided today and declined a print copy of patient instruction materials.   The care management team will reach out to the patient again over the next 10 days.   [name] RN, BSN, [facility] Care Management Coordinator [facility] Phone:  [phone] Fax: [phone]

## 2020-03-25 NOTE — ED Provider Notes (Signed)
[place] EMERGENCY DEPARTMENT Provider Note   CSN: [id number] Arrival date & time: 03/24/20  1523     History No chief complaint on file.   Matthew Lynch is a 49 y.o. male.  The history is provided by the patient and medical records. No language interpreter was used.     49 year old male with history of insulin-dependent diabetes, depression, anxiety, chronic back pain presenting for evaluation of hyperglycemia.  Patient report for more than a month he has had persistent weight loss, losing approximately 30 pounds of weight, feeling generalized fatigue, having polyuria, polydipsia, abdominal pain, blurry vision, and not feeling well.  He also admits that he has not has his diabetic medication for the same duration.  He was seen about a week ago for his complaints and subsequently was prescribed insulin, and Metformin.  He did take it infrequently but has not noticed any significant improvement thus prompting this ER visit.  His pain is moderate in severity, sharp and achy including abdominal pain.  He denies having any recent sickness, denies fever or chills vomiting or diarrhea or having cough.  He admits he could not afford his medication until he was seen in the ED and was given his medication.  Past Medical History:  Diagnosis Date  . Diabetes mellitus without complication [facility])     Patient Active Problem List   Diagnosis Date Noted  . Depression, major, single episode, moderate ([place]) 01/29/2020  . Hyperglycemia   . Hyperlipidemia   . Tobacco abuse   . Anxiety   . Type 2 diabetes mellitus with hyperglycemia ([place]) 01/24/2020  . Type 2 diabetes mellitus ([place]) 01/24/2020  . Hyperlipidemia associated with type 2 diabetes mellitus ([place]) 01/25/2019  . Chronic bilateral low back pain without sciatica 08/24/2018  . Diabetes mellitus ([place]) 02/01/2018    History reviewed. No pertinent surgical history.     No family history on file.  Social History    Tobacco Use  . Smoking status: Current Every Day Smoker    Types: Cigarettes  . Smokeless tobacco: Never Used  Vaping Use  . Vaping Use: Never used  Substance Use Topics  . Alcohol use: Never  . Drug use: Never    Home Medications Prior to Admission medications   Medication Sig Start Date End Date Taking? Authorizing Provider  Blood Glucose Monitoring Suppl (ONETOUCH VERIO) w/Device KIT 1 each by Does not apply route daily. 02/03/18  Yes Mayo, Pete Pelt, MD  FLUoxetine (PROZAC) 20 MG tablet Take 1 tablet (20 mg total) by mouth daily. 01/29/20  Yes [name], MD  glucose blood [facility] VERIO) test strip Check blood sugar 2-3 times daily 02/03/18  Yes Mayo, Pete Pelt, MD  insulin glargine (LANTUS) 100 UNIT/ML Solostar Pen Inject 10 Units into the skin daily. 01/26/20  Yes Brimage, Vondra, DO  Insulin Pen Needle 29G X 12.7MM MISC 1 Stick by Does not apply route daily. 01/26/20  Yes Wilber Oliphant, MD  linagliptin (TRADJENTA) 5 MG TABS tablet Take 1 tablet (5 mg total) by mouth daily. 01/27/20  Yes Wilber Oliphant, MD  metFORMIN (GLUMETZA) 1000 MG (MOD) 24 hr tablet Take 1 tablet (1,000 mg total) by mouth daily with breakfast. 03/20/20  Yes [name], MD  OVER THE COUNTER MEDICATION Take 2 drops by mouth daily. Black seed oil (drops)   Yes [provider]  rosuvastatin (CRESTOR) 20 MG tablet Take 1 tablet (20 mg total) by mouth daily. 01/25/19  Yes Diallo, Earna Coder, MD  Glory Rosebush  DELICA LANCETS 72Z MISC Check blood sugar 2-3 times daily 02/03/18   Mayo, Pete Pelt, MD    Allergies    Patient has no known allergies.  Review of Systems   Review of Systems  All other systems reviewed and are negative.   Physical Exam Updated Vital Signs BP 97/76   Pulse 79   Temp 98.5 F (36.9 C) (Oral)   Resp 18   SpO2 98%   Physical Exam Vitals and nursing note reviewed.  Constitutional:      General: He is not in acute distress.    Appearance: He is well-developed.     Comments:  [name] male in no acute discomfort.  HENT:     Head: Atraumatic.  Eyes:     Conjunctiva/sclera: Conjunctivae normal.  Cardiovascular:     Rate and Rhythm: Normal rate and regular rhythm.     Pulses: Normal pulses.     Heart sounds: Normal heart sounds.  Pulmonary:     Effort: Pulmonary effort is normal.     Breath sounds: Normal breath sounds.  Abdominal:     Palpations: Abdomen is soft.     Tenderness: There is abdominal tenderness (Mild diffuse abdominal tenderness without guarding or rebound tenderness.).  Musculoskeletal:        General: Normal range of motion.     Cervical back: Neck supple.  Skin:    Findings: No rash.  Neurological:     Mental Status: He is alert and oriented to person, place, and time.  Psychiatric:        Mood and Affect: Mood normal.     ED Results / Procedures / Treatments   Labs (all labs ordered are listed, but only abnormal results are displayed) Labs Reviewed  BASIC METABOLIC PANEL - Abnormal; Notable for the following components:      Result Value   Sodium 132 (*)    Chloride 95 (*)    Glucose, Bld 611 (*)    All other components within normal limits  URINALYSIS, ROUTINE W REFLEX MICROSCOPIC - Abnormal; Notable for the following components:   Color, Urine STRAW (*)    Glucose, UA >=500 (*)    All other components within normal limits  CBG MONITORING, ED - Abnormal; Notable for the following components:   Glucose-Capillary 584 (*)    All other components within normal limits  CBG MONITORING, ED - Abnormal; Notable for the following components:   Glucose-Capillary 573 (*)    All other components within normal limits  CBG MONITORING, ED - Abnormal; Notable for the following components:   Glucose-Capillary 290 (*)    All other components within normal limits  CBG MONITORING, ED - Abnormal; Notable for the following components:   Glucose-Capillary 182 (*)    All other components within normal limits  CBC     EKG None  Radiology No results found.  Procedures Procedures (including critical care time)  Medications Ordered in ED Medications  sodium chloride 0.9 % bolus 1,000 mL (1,000 mLs Intravenous New Bag/Given 03/25/20 0555)  insulin aspart (novoLOG) injection 5 Units (5 Units Subcutaneous Given 03/25/20 0557)    ED Course  I have reviewed the triage vital signs and the nursing notes.  Pertinent labs & imaging results that were available during my care of the patient were reviewed by me and considered in my medical decision making (see chart for details).    MDM Rules/Calculators/A&P  BP 98/75   Pulse 66   Temp 98.5 F (36.9 C) (Oral)   Resp 18   SpO2 99%   Final Clinical Impression(s) / ED Diagnoses Final diagnoses:  Hyperglycemia    Rx / DC Orders ED Discharge Orders    None     5:55 AM Patient with history of insulin-dependent diabetes who has not been compliant with his diabetic medication presenting with symptoms suggestive of poorly controlled diabetes.  His initial CBG was 611 with no evidence of DKA.  He has been waiting in the ER waiting room for approximately 14 hours prior to being seen.  A repeat CBG is currently at 290.  We will give IV fluid along with 5 units of insulin.  Patient will need to continue taking his medication as prescribed and close follow-up with PCP.  6:58 AM After IV fluid and insulin, CBG is now 189.  Patient is stable for discharge.  He will need to follow-up with his PCP.  Encourage patient to be compliant with his medication.  Return precaution discussed.   Domenic Moras, PA-C 03/25/20 0569    Merryl Hacker, MD 03/25/20 [phone]

## 2020-03-25 NOTE — Telephone Encounter (Signed)
Patient calls nurse line reporting high cbgs since leaving ED. Patient reports he went to ED last night for high cbgs. Patient was discharged with a lower blood sugar and counseled on appropriate diet and FU. After patient left ED this am he went to [employer] and had an egg and cheese biscuit with a side of grits. A little bit later he ate a pop tart and a "nice size bowl of cereal." Patient reports CBG at 3pm today 566, while on the phone with me asked to recheck, 506. Patient reports he has not taken metformin today, he needs to pick it up at the pharmacy. Patient has not taken his insulin yet, "I do this before bed." Patient did take one tradjenta. I precepted with Hensel who advised patient to take 10units of insulin now and 5 units before bed and to drink lots of water. Patient contacted and advised of plan, patient appreciative and agreed. ED precautions given to patient. Patient advised to check sugars regularly and pick up metformin tomorrow. Will FU as scheduled on Thursday.

## 2020-03-25 NOTE — Chronic Care Management (AMB) (Signed)
Care Management   Follow Up Note   03/25/2020 Name: Matthew Lynch MRN: [id number] DOB: Jan 28, 1971  Referred by: Matthew Mo, MD Reason for referral : Appointment (RNCM Diabetes F/U)   Matthew Lynch is a 49 y.o. year old male who is a primary care patient of Matthew Mo, MD. The care management team was consulted for assistance with care management and care coordination needs.    Review of patient status, including review of consultants reports, relevant laboratory and other test results, and collaboration with appropriate care team members and the patient's provider was performed as part of comprehensive patient evaluation and provision of chronic care management services.    SDOH (Social Determinants of Health) assessments performed: No See Care Plan activities for detailed interventions related to [facility])     Advanced Directives: See Care Plan and Vynca application for related entries.   Goals Addressed              This Visit's Progress   .  I am tired all the time (pt-stated)        CARE PLAN ENTRY (see longtitudinal plan of care for additional care plan information)  Objective:  Lab Results  Component Value Date   HGBA1C 14.2 (A) 01/24/2020 .   Lab Results  Component Value Date   CREATININE 0.98 01/29/2020   CREATININE 0.78 01/26/2020   CREATININE 0.87 01/25/2020 .   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  He does not eat the correct things because all he has around him is food at the job evetrhing else is closed.  He does not check his blood sugars as he should. . Knowledge Deficits related to medications used for management of diabetes . Difficulty obtaining or cannot afford medications . Financial Constraints  Case Manager Clinical Goal(s):    Over the next 90 days, patient will demonstrate improved adherence to prescribed treatment plan for diabetes self care/management as evidenced by: lowering his a1c by 1-2 points . daily monitoring and recording of CBG  . adherence to prescribed medication regimen  Interventions:  . Provided education to patient about basic DM disease process . Reviewed medications with patient and discussed importance of medication adherence . Discussed plans with patient for ongoing care management follow up and provided patient with direct contact information for care management team . Reviewed scheduled/upcoming provider appointments including: Matthew Lynch 02/25/20 at 1030 am/ [name] 02/29/20 at 150 pm. . Advised patient, providing education and rationale, to check cbg at least once daily since he has not been checking at all and record, calling the office  for findings outside established parameters.   . Referral made to pharmacy team for assistance with Lantus and Tradjenta . Review of patient status, including review of consultants reports, relevant laboratory and other test results, and medications completed. . 03/24/20 . Patient stated that he was at the hospital his blood sugars have been running in the 4-5 hundreds  He stated tha he had been feel tired and weak, his vision was blurry.  He was in the ED on 03/21/20 due to his blood sugars being high.  He states that he had received his lantus and tradjenta 5 mg after he left the hospital on the 23 rd and been taking his medications but his blood  sugars were still high and he was told to come back if that was the case.   . I reminded him that he has an appointment with Matthew Lynch on 03/27/20 at the office and he verbalized understanding.  Patient Self Care Activities:  . UNABLE to independently self manage diabetes . Self administers oral medications as prescribed  Please see past updates related to this goal by clicking on the "Past Updates" button  in the selected goal          The care management team will reach out to the patient again over the next 10 days.   SIGNATURE

## 2020-03-25 NOTE — ED Notes (Signed)
Refused vitals/asleep

## 2020-03-27 ENCOUNTER — Ambulatory Visit: Payer: BC Managed Care – PPO | Admitting: Pharmacist

## 2020-03-27 ENCOUNTER — Ambulatory Visit (INDEPENDENT_AMBULATORY_CARE_PROVIDER_SITE_OTHER): Payer: BC Managed Care – PPO | Admitting: Pharmacist

## 2020-03-27 ENCOUNTER — Ambulatory Visit: Payer: BC Managed Care – PPO | Admitting: Licensed Clinical Social Worker

## 2020-03-27 ENCOUNTER — Ambulatory Visit (INDEPENDENT_AMBULATORY_CARE_PROVIDER_SITE_OTHER): Payer: BC Managed Care – PPO | Admitting: Family Medicine

## 2020-03-27 ENCOUNTER — Encounter: Payer: Self-pay | Admitting: Family Medicine

## 2020-03-27 ENCOUNTER — Other Ambulatory Visit: Payer: Self-pay

## 2020-03-27 ENCOUNTER — Encounter: Payer: Self-pay | Admitting: Pharmacist

## 2020-03-27 VITALS — BP 98/60 | HR 102 | Ht 64.0 in | Wt 102.6 lb

## 2020-03-27 VITALS — BP 115/76 | HR 85 | Ht 65.0 in | Wt 103.0 lb

## 2020-03-27 DIAGNOSIS — E1169 Type 2 diabetes mellitus with other specified complication: Secondary | ICD-10-CM

## 2020-03-27 DIAGNOSIS — E1165 Type 2 diabetes mellitus with hyperglycemia: Secondary | ICD-10-CM | POA: Diagnosis not present

## 2020-03-27 LAB — GLUCOSE, POCT (MANUAL RESULT ENTRY): POC Glucose: 160 mg/dl — AB (ref 70–99)

## 2020-03-27 MED ORDER — METFORMIN HCL ER (MOD) 1000 MG PO TB24: 1000.0000 mg | ORAL_TABLET | Freq: Two times a day (BID) | ORAL | 3 refills | Status: DC

## 2020-03-27 MED ORDER — METFORMIN HCL ER 500 MG PO TB24: 1000.0000 mg | ORAL_TABLET | Freq: Two times a day (BID) | ORAL | 3 refills | Status: DC

## 2020-03-27 MED ORDER — INSULIN GLARGINE 100 UNIT/ML SOLOSTAR PEN
20.0000 [IU] | PEN_INJECTOR | Freq: Every day | SUBCUTANEOUS | 0 refills | Status: DC
Start: 2020-03-27 — End: 2020-12-10

## 2020-03-27 NOTE — Progress Notes (Signed)
S:     PCP: [name], MD  Chief Complaint  Patient presents with  . Medication Management    Diabetes   Patient arrives in good spirits. Presents for diabetes evaluation, education, and management Patient was referred and last seen by Primary Care Provider on  03/27/20 in which Lantus was increased from 10 units to 20 units and metformin increased to 1000 mg BID.   Mr. Weekly has had multiple ED visits the past week for hyperglycemia. He reports that he has not been compliant and frequently runs out of his medications due to inability to afford them and also lacked seriousiness about his condition.   Today, he states he is serious about taking medications every day and knows that he has to "fight" for himself, children and family. Patient reports that he will start Lantus 20 units at bedtime tonight and metformin 500 mg XR (2 tablets BID). He does not administer his insulin shots; his friend's mother has been giving him the Lantus shots every evening. Reports home morning blood sugars have been 400-500s and 160 this morning fasting.  He also reports smoking 4-6 cigarettes a day. He has quit for 2 years about 20 years ago and has never tried NRT. Stress is the major trigger. He is also reports that he takes Aspirin 325 mg daily, sometimes 2 times daily for pain.   Family/Social History:   Insurance coverage/medication affordability: BCBS PPO  Current diabetes medications include: Metformin 500 mg XR (2 tablets BID), Lantus 20 units at bedtime, Tradjenta 5 mg daily Current hyperlipidemia medications include: Rosuvastatin 20 mg daily  Patient denies hypoglycemic events.  Patient reported dietary habits: raisin brand cereal with 2% meal twice daily every day, fast food, fried food, pizza, no vegetables, hot dogs; drink juices, water mostly, gatorade  Patient-reported exercise habits: job is exercise "works in hot and [employer]"   O:  Physical Exam Constitutional:       Appearance: Normal appearance.  Pulmonary:     Effort: Pulmonary effort is normal.  Neurological:     Mental Status: He is alert.  Psychiatric:        Mood and Affect: Mood normal.        Behavior: Behavior normal.        Thought Content: Thought content normal.    Review of Systems  Constitutional: Positive for weight loss.  Genitourinary: Positive for frequency.  All other systems reviewed and are negative.  Lab Results  Component Value Date   HGBA1C 14.2 (A) 01/24/2020   Vitals:   03/27/20 1146  BP: 115/76  Pulse: 85  SpO2: 98%    Lipid Panel     Component Value Date/Time   CHOL 166 01/25/2020 1308   CHOL 208 (H) 08/24/2018 1557   TRIG 272 (H) 01/25/2020 1308   HDL 32 (L) 01/25/2020 1308   HDL 42 08/24/2018 1557   CHOLHDL 5.2 01/25/2020 1308   VLDL 54 (H) 01/25/2020 1308   LDLCALC 80 01/25/2020 1308   LDLCALC 132 (H) 08/24/2018 1557    Home fasting blood sugars: 400-500s  Clinical Atherosclerotic Cardiovascular Disease (ASCVD): No  The 10-year ASCVD risk score Denman George DC Jr., et al., 2013) is: 12.9%   Values used to calculate the score:     Age: 26 years     Sex: Male     Is Non-Hispanic African American: Yes     Diabetic: Yes     Tobacco smoker: Yes     Systolic Blood  Pressure: 115 mmHg     Is BP treated: No     HDL Cholesterol: 32 mg/dL     Total Cholesterol: 166 mg/dL    A/P: Diabetes longstanding currently uncontrolled with A1C 14.2%. Control is suboptimal due to medication non-adherence and inability to afford medications. non-adherent to medication regimen with multiple ED visits due to hyperglycemia. Today, Dr. Homero Fellers adjusted lantus from 10 units to 20 units at bedtime and increased metformin to 1000 mg BID. Discussed the importance of medication adherence. Encouraged patient to limit overall sugar intake. Also, explained the risks of taking Aspirin 325 mg 1-2 times daily (bleeding, kidney issues, elevated BP). Encouraged patient to try using Tylenol  for pain instead. Patient is able to verbalize appropriate hypoglycemia management plan.  - Continue Lantus 20 units at bedtime - Continue Metformin 500 mg XR (2 tablets BID with meals) (we revised prescription from 1000mg  tablets to 500mg  tablets and sent new prescription to pharmacy due to high cost of 1000mg  tablets) - Continue Trajenta 5 mg daily - Extensively discussed pathophysiology of diabetes, recommended lifestyle interventions, dietary effects on blood sugar control - Counseled on s/sx of and management of hypoglycemia - Counseled on proper insulin administration technique administer insulin - Will call patient in 1 week for see progress  - Counseled on diet changes (less juice, switch to Gatorade Zero), starting glucerna  ASCVD risk - primary prevention in patient with diabetes. Last LDL is controlled. ASCVD risk score is not >20%. Aspirin is not indicated.  -Continue Rosuvastatin 20 mg daily  Written patient instructions provided.  Total time in face to face counseling 35 minutes.   We will plan to call him in 1 week.  He plans to see Dr. in 2 weeks.  Follow up Pharmacist Clinic Visit in 3 weeks. Patient seen with , PharmD Candidate and , PharmD, PGY2 Pharmacy Resident.

## 2020-03-27 NOTE — Assessment & Plan Note (Signed)
>>  ASSESSMENT AND PLAN FOR TYPE 2 DIABETES MELLITUS WITH HYPERGLYCEMIA (HCC) WRITTEN ON 03/27/2020 10:48 AM BY [name], MD  Poorly controlled recently likely due to inadequate medical regiment and inability to afford medications at times.  Will make the following medication changes and plan to have him follow-up with pharmacy regarding affordability and other medication options.  For today, I do not want to start him on a new medication until he has some improved blood sugar control. -Increase Metformin to 1000 mg twice daily -Increase Lantus to 20 units daily -Follow-up with social work regarding housing and mood -Follow-up with Dr. Raymondo Band regarding medication affordability and medication options -Placed ambulatory referral to ophthalmology for diabetic eye exam -Follow-up in 2 weeks -He declined Covid and pneumonia vaccines today

## 2020-03-27 NOTE — Assessment & Plan Note (Addendum)
Poorly controlled recently likely due to inadequate medical regiment and inability to afford medications at times.  Will make the following medication changes and plan to have him follow-up with pharmacy regarding affordability and other medication options.  For today, I do not want to start him on a new medication until he has some improved blood sugar control. -Increase Metformin to 1000 mg twice daily -Increase Lantus to 20 units daily -Follow-up with social work regarding housing and mood -Follow-up with Dr. Raymondo Band regarding medication affordability and medication options -Placed ambulatory referral to ophthalmology for diabetic eye exam -Follow-up in 2 weeks -He declined Covid and pneumonia vaccines today

## 2020-03-27 NOTE — Progress Notes (Signed)
    SUBJECTIVE:   CHIEF COMPLAINT / HPI:   [name] has had multiple ED visits in the past several weeks for elevated blood sugar.  This seems to be, in part, due to his inability to afford medications.  His medications currently include Tradjenta, Lantus 10 units nightly, Metformin 1000 units in the morning.  He reports that he has not taken any Metformin for the past week because he ran out.  He has been consistent with his use of Lantus and Tradjenta.  Reports that he has been checking his morning blood sugars and they are often in the 400s.  He brought his glucometer with him today and verified multiple elevated blood sugars in the morning.  He has been working with social work for housing and mood issues.  He is planning to meet with Dr. Raymondo Lynch later today for further discussion regarding medical management and medication affordability.   PERTINENT  PMH / PSH: Diabetes, type II  OBJECTIVE:   BP (!) 98/60   Pulse 102   Ht 5\' 4"  (1.626 m)   Wt 102 lb 9.6 oz (46.5 kg)   SpO2 100%   BMI 17.61 kg/m    General: Alert and cooperative and appears to be in no acute distress HEENT: Moist mucous membranes. Cardio: Normal S1 and S2, no S3 or S4. Rhythm is regular. No murmurs or rubs.   Pulm: Clear to auscultation bilaterally, no crackles, wheezing, or diminished breath sounds. Normal respiratory effort Abdomen: Bowel sounds normal. Abdomen soft and non-tender.  Extremities: No peripheral edema. Warm/ well perfused.  Strong radial pulse.  Diabetic Foot Exam - Simple   Simple Foot Form Diabetic Foot exam was performed with the following findings: Yes 03/27/2020 10:47 AM  Visual Inspection No deformities, no ulcerations, no other skin breakdown bilaterally: Yes Sensation Testing Intact to touch and monofilament testing bilaterally: Yes Pulse Check Posterior Tibialis and Dorsalis pulse intact bilaterally: Yes Comments      ASSESSMENT/PLAN:   Type 2 diabetes mellitus  with hyperglycemia (HCC) Poorly controlled recently likely due to inadequate medical regiment and inability to afford medications at times.  Will make the following medication changes and plan to have him follow-up with pharmacy regarding affordability and other medication options.  For today, I do not want to start him on a new medication until he has some improved blood sugar control. -Increase Metformin to 1000 mg twice daily -Increase Lantus to 20 units daily -Follow-up with social work regarding housing and mood -Follow-up with Dr. 03/29/2020 regarding medication affordability and medication options -Follow-up in 2 weeks     Matthew Band, MD [facility]

## 2020-03-27 NOTE — Assessment & Plan Note (Signed)
Diabetes longstanding currently uncontrolled with A1C 14.2%. Control is suboptimal due to medication non-adherence and inability to afford medications. non-adherent to medication regimen with multiple ED visits due to hyperglycemia. Today, Dr. Homero Fellers adjusted lantus from 10 units to 20 units at bedtime and increased metformin to 1000 mg BID. Discussed the importance of medication adherence. Encouraged patient to limit overall sugar intake. Also, explained the risks of taking Aspirin 325 mg 1-2 times daily (bleeding, kidney issues, elevated BP). Encouraged patient to try using Tylenol for pain instead. Patient is able to verbalize appropriate hypoglycemia management plan.  - Continue Lantus 20 units at bedtime - Continue Metformin 500 mg XR (2 tablets BID with meals) (we revised prescription from 1000mg  tablets to 500mg  tablets and sent new prescription to pharmacy due to high cost of 1000mg  tablets) - Continue Trajenta 5 mg daily - Extensively discussed pathophysiology of diabetes, recommended lifestyle interventions, dietary effects on blood sugar control - Counseled on s/sx of and management of hypoglycemia - Counseled on proper insulin administration technique administer insulin - Will call patient in 1 week for see progress  - Counseled on diet changes (less juice, switch to Gatorade Zero), starting glucerna

## 2020-03-27 NOTE — Patient Instructions (Signed)
As a teen, I think that we can do a better job with your diabetes to help keep you out of the emergency room.  Today, Matthew Lynch will speak to you about financial concerns and will see if we can help at all with taking her medications more affordable.  Please also keep your appointment with Dr. Raymondo Band later this morning who may also have suggestions for medication affordability and new medications.  For now, increase your Metformin to 1000 mg in the morning and 1000 mg in the evening.  Also increase your Lantus to 20 units daily.  I will put in an ambulatory referral to ophthalmology to make sure we get an eye exam for you.  We should do that annually.

## 2020-03-27 NOTE — Chronic Care Management (AMB) (Signed)
Care Management   Clinical Social Work Follow Up   03/27/2020 Name: Matthew Lynch MRN: [id number] DOB: Feb 19, 1971 Referred by: [name], MD  Reason for referral : Care Coordination (F/U )  Matthew Lynch is a 49 y.o. year old male who is a primary care patient of [name], MD.  Reason for follow-up: assess for barriers and progress with managing stressors and connecting to counseling .   Assessment: Patient continues to experience difficulty with managing stress which seems to be exacerbated by financial restaints. Patient is making progress towards goal and has connected with counseling.  Patient reports not taking prozac for two weeks due to running out, he did not call to request a refill. LCSW verified that patient has two more refills at [facility].  See care below for interventions.  Plan:  1. CCM RN will F/U with patient for ongoing chronic diease management  2.   Will consult with LCSW as new needs arise Advance Directive Status: ; not addressed during this encounter.  SDOH (Social Determinants of Health) assessments performed: No needs identified   Goals Addressed            This Visit's Progress   . Connect with Counseling Completed   On track    [name] (see longitudinal plan of care for additional care plan information)  Current Barriers:  . Patient with Depression acknowledges deficits with connecting to mental health provider for ongoing counseling.  . Patient is experiencing symptoms of  depression which seem to be exacerbated by psychosocial stressors and uncontrolled diabetes .     Matthew Lynch Patient needs Support, Education, and Care Coordination in order to meet unmet mental health needs  . Patient has connected with [facility] for counseling Clinical Social Work Goal(s):  Matthew Lynch Over the next 30 days, patient will connect for ongoing counseling.  . Patient will implement clinical interventions discussed today to decreases symptoms of  depression and stress and increase knowledge and/or ability of: coping skills. Interventions:  . Assessed patient's  previous treatment, needs and barriers to care . Provided basic mental health support, education and interventions  . Collaborated with CCM RN regarding patient needs . Assessed patient's progress with counseling at [facility] [phone] ) . Assessing medication compliance: patient ran out of medication and has not taken in two weeks, assisted patient with calling [facility] to get refill on Prozac. Matthew Lynch Provided patient with gift card to purchase medication . Other interventions include: emotional support and psycho-education  Patient Self Care Activities & Deficits:  . Patient is unable to independently navigate community resource options without care coordination support . Patient is able to implement clinical interventions discussed today and is motivated for treatment  . Patient will F/U with [facility] for weekly phone counseling appointments Please see past updates related to this goal by clicking on the "Past Updates" button in the selected goal        Outpatient Encounter Medications as of 03/27/2020  Medication Sig  . Blood Glucose Monitoring Suppl (ONETOUCH VERIO) w/Device KIT 1 each by Does not apply route daily.  Matthew Lynch FLUoxetine (PROZAC) 20 MG tablet Take 1 tablet (20 mg total) by mouth daily.  Matthew Lynch glucose blood (ONETOUCH VERIO) test strip Check blood sugar 2-3 times daily  . insulin glargine (LANTUS) 100 UNIT/ML Solostar Pen Inject 20 Units into the skin daily.  . Insulin Pen Needle 29G X 12.7MM MISC 1 Stick by Does not apply route daily.  Matthew Lynch  linagliptin (TRADJENTA) 5 MG TABS tablet Take 1 tablet (5 mg total) by mouth daily.  . metFORMIN (GLUCOPHAGE-XR) 500 MG 24 hr tablet Take 2 tablets (1,000 mg total) by mouth 2 (two) times daily with a meal.  . ONETOUCH DELICA LANCETS 14C MISC Check blood sugar 2-3 times daily  . rosuvastatin  (CRESTOR) 20 MG tablet Take 1 tablet (20 mg total) by mouth daily.   No facility-administered encounter medications on file as of 03/27/2020.   Review of patient status, including review of consultants reports, relevant laboratory and other test results, and collaboration with appropriate care team members and the patient's provider was performed as part of comprehensive patient evaluation and provision of care management services.    Casimer Lanius, [place] / [place]   [phone] 3:16 PM

## 2020-03-27 NOTE — Patient Instructions (Signed)
It was nice to see you today!  Your goal blood sugar is 80-130 before eating and less than 180 after eating.  Medication Changes: Begin taking Lantus 20 units at bedtime  Begin taking Metformin 2 tablets with breakfast and 2 tablets with dinner  Continue Tradjenta 5 mg daily  Monitor blood sugars at home and keep a log (glucometer or piece of paper) to bring with you to your next visit.  Aim for a diet full of vegetables, fruit and lean meats (chicken, [place], fish). Try to limit salt intake by eating fresh or frozen vegetables (instead of canned), rinse canned vegetables prior to cooking and do not add any additional salt to meals.  Also, try to switch to drinking Gatorade Zero instead of regular Gatorade. Also, Glucerna is available to use as a meal replacements.  If you have sugars < 80 mg/dL. Give [place] a call. - Also, drink a cup of orange or apple juice, recheck your sugar in 15 minutes and repeat these steps if sugars are still below 80.   Will call you in a week to see how you are doing!  Next follow-up appointment in 3 weeks with pharmacy.

## 2020-03-31 NOTE — Progress Notes (Signed)
Reviewed: I agree with Dr. Koval's documentation and management. 

## 2020-04-01 ENCOUNTER — Telehealth: Payer: Self-pay | Admitting: *Deleted

## 2020-04-01 NOTE — Chronic Care Management (AMB) (Signed)
  Care Management   Note  04/01/2020 Name: [name] MRN: [id number] DOB: [birth year]  Matthew Lynch is a 49 y.o. year old male who is a primary care patient of [name], MD and is actively engaged with the care management team. I reached out to [name] by phone today to assist with re-scheduling a follow up visit with the RN Case Manager  Follow up plan: Telephone appointment with care management team member scheduled for:04/14/20  [facility] Guide, Embedded Care Coordination [facility]  [place], [place] [address] Direct Dial: [phone] [email or web address]: [email or web address]

## 2020-04-08 ENCOUNTER — Telehealth: Payer: BC Managed Care – PPO

## 2020-04-14 ENCOUNTER — Ambulatory Visit: Payer: BC Managed Care – PPO

## 2020-04-14 ENCOUNTER — Other Ambulatory Visit: Payer: Self-pay

## 2020-04-14 NOTE — Patient Instructions (Signed)
Visit Information  Goals Addressed              This Visit's Progress   .  I am tired all the time (pt-stated)        CARE PLAN ENTRY (see longtitudinal plan of care for additional care plan information)  Objective:  Lab Results  Component Value Date   HGBA1C 14.2 (A) 01/24/2020 .   Lab Results  Component Value Date   CREATININE 0.98 01/29/2020   CREATININE 0.78 01/26/2020   CREATININE 0.87 01/25/2020 .   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  He does not eat the correct things because all he has around him is food at the job evetrhing else is closed.  He does not check his blood sugars as he should. . Knowledge Deficits related to medications used for management of diabetes . Difficulty obtaining or cannot afford medications . Financial Constraints  Case Manager Clinical Goal(s):  Over the next 90 days, patient will demonstrate improved adherence to prescribed treatment plan for diabetes self care/management as evidenced by: lowering his a1c by 1-2 points . daily monitoring and recording of CBG  . adherence to prescribed medication regimen  Interventions:  . Provided education to patient about basic DM disease process . Reviewed medications with patient and discussed importance of medication adherence . Discussed plans with patient for ongoing care management follow up and provided patient with direct contact information for care management team . Reviewed scheduled/upcoming provider appointments including: [name] 02/25/20 at 1030 am/ Dr frank 02/29/20 at 150 pm. . Advised patient, providing education and rationale, to check cbg at least once daily since he has not been checking at all and record, calling the office  for findings outside established parameters.    . Referral made to pharmacy team for assistance with Lantus and Tradjenta . Review of patient status, including review of consultants reports, relevant laboratory and other test results, and medications completed. . 04/14/20  . Patient stated that he was currently out of town but he has been checking his blood sugars in the morning .  He states that he does not write his blood sugars down but they have been running in the 200's around 253 to 264.  He states that he is still weak , and vision blurry. Advised the patient to monitor his diet and his carbs.   . He states that he is taking his medication as he should.  He states that he has an appointment with [name] Pharm D 04/15/20 for medication adjustment.  Patient Self Care Activities:  . UNABLE to independently self manage diabetes . Self administers oral medications as prescribed  Please see past updates related to this goal by clicking on the "Past Updates" button in the selected goal         Mr. Strength was given information about Care Management services today including:  1. Care Management services include personalized support from designated clinical staff supervised by his physician, including individualized plan of care and coordination with other care providers 2. 24/7 contact phone numbers for assistance for urgent and routine care needs. 3. The patient may stop CCM services at any time (effective at the end of the month) by phone call to the office staff.  Patient agreed  to services and verbal consent obtained.   The patient verbalized understanding of instructions provided today and declined a print copy of patient instruction materials.   The care management team will reach out to the patient again over the next 14 days.    [name] RN, BSN, [facility] Care Management Coordinator [facility] Phone: [phone] Fax: [phone]

## 2020-04-14 NOTE — Chronic Care Management (AMB) (Signed)
Care Management   Follow Up Note   04/14/2020 Name: Matthew Lynch MRN: [id number] DOB: May 19, 1971  Referred by: [name], MD Reason for referral : Appointment (DM II)   Matthew Lynch is a 49 y.o. year old male who is a primary care patient of [name], MD. The care management team was consulted for assistance with care management and care coordination needs.    Review of patient status, including review of consultants reports, relevant laboratory and other test results, and collaboration with appropriate care team members and the patient's provider was performed as part of comprehensive patient evaluation and provision of chronic care management services.    SDOH (Social Determinants of Health) assessments performed: No See Care Plan activities for detailed interventions related to [facility])     Advanced Directives: See Care Plan and Vynca application for related entries.   Goals Addressed              This Visit's Progress   .  I am tired all the time (pt-stated)        CARE PLAN ENTRY (see longtitudinal plan of care for additional care plan information)  Objective:  Lab Results  Component Value Date   HGBA1C 14.2 (A) 01/24/2020 .   Lab Results  Component Value Date   CREATININE 0.98 01/29/2020   CREATININE 0.78 01/26/2020   CREATININE 0.87 01/25/2020 .   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  He does not eat the correct things because all he has around him is food at the job evetrhing else is closed.  He does not check his blood sugars as he should. . Knowledge Deficits related to medications used for management of diabetes . Difficulty obtaining or cannot afford medications . Financial Constraints  Case Manager Clinical Goal(s):  Over the next  90 days, patient will demonstrate improved adherence to prescribed treatment plan for diabetes self care/management as evidenced by: lowering his a1c by 1-2 points . daily monitoring and recording of CBG  . adherence to prescribed medication regimen  Interventions:  . Provided education to patient about basic DM disease process . Reviewed medications with patient and discussed importance of medication adherence . Discussed plans with patient for ongoing care management follow up and provided patient with direct contact information for care management team . Reviewed scheduled/upcoming provider appointments including: [name] 02/25/20 at 1030 am/ Dr frank 02/29/20 at 150 pm. . Advised patient, providing education and rationale, to check cbg at least once daily since he has not been checking at all and record, calling the office  for findings outside established parameters.   . Referral made to pharmacy team for assistance with Lantus and Tradjenta . Review of patient status, including review of consultants reports, relevant laboratory and other test results, and medications completed. . 04/14/20  . Patient stated that he was currently out of town but he has been checking his blood sugars in the morning .  He states that he does not write his blood sugars down but they have been running in the 200's around 253 to 264.  He states that he is still weak , and vision blurry. Advised the patient to monitor his diet and his carbs.   . He states that he is taking his medication as  he should.  He states that he has an appointment with [name] Pharm D 04/15/20 for medication adjustment.  Patient Self Care Activities:  . UNABLE to independently self manage diabetes . Self administers oral medications as prescribed  Please see past updates related to this goal by clicking on the "Past Updates" button in the selected goal          The care management team will reach out to the patient again over the  next 14 days.  [name] RN, BSN, [facility] Care Management Coordinator [facility] Phone: [phone] Fax: [phone]

## 2020-04-15 ENCOUNTER — Ambulatory Visit: Payer: BC Managed Care – PPO | Admitting: Pharmacist

## 2020-04-17 ENCOUNTER — Ambulatory Visit: Payer: BC Managed Care – PPO | Admitting: Pharmacist

## 2020-04-29 ENCOUNTER — Ambulatory Visit: Payer: BC Managed Care – PPO

## 2020-04-29 NOTE — Chronic Care Management (AMB) (Signed)
Care Management   Follow Up Note   04/29/2020 Name: Matthew Lynch MRN: [id number] DOB: March 24, 1971  Referred by: [name], MD Reason for referral : Appointment (DM II)   Matthew Lynch is a 49 y.o. year old male who is a primary care patient of [name], MD. The care management team was consulted for assistance with care management and care coordination needs.    Review of patient status, including review of consultants reports, relevant laboratory and other test results, and collaboration with appropriate care team members and the patient's provider was performed as part of comprehensive patient evaluation and provision of chronic care management services.    SDOH (Social Determinants of Health) assessments performed: No See Care Plan activities for detailed interventions related to [facility])     Advanced Directives: See Care Plan and Vynca application for related entries.   Goals Addressed              This Visit's Progress   .  I am tired all the time (pt-stated)        CARE PLAN ENTRY (see longtitudinal plan of care for additional care plan information)  Objective:  Lab Results  Component Value Date   HGBA1C 14.2 (A) 01/24/2020 .   Lab Results  Component Value Date   CREATININE 0.98 01/29/2020   CREATININE 0.78 01/26/2020   CREATININE 0.87 01/25/2020 .   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  He does not eat the correct things because all he has around him is food at the job evetrhing else is closed.  He does not check his blood sugars as he should. . Knowledge Deficits related to medications used for management of diabetes . Difficulty obtaining or cannot afford medications . Financial Constraints  Case Manager Clinical Goal(s):  Over the next  90 days, patient will demonstrate improved adherence to prescribed treatment plan for diabetes self care/management as evidenced by: lowering his a1c by 1-2 points . daily monitoring and recording of CBG  . adherence to prescribed medication regimen  Interventions:  . Provided education to patient about basic DM disease process . Reviewed medications with patient and discussed importance of medication adherence . Discussed plans with patient for ongoing care management follow up and provided patient with direct contact information for care management team . Reviewed scheduled/upcoming provider appointments including: [name] 02/25/20 at 1030 am/ Dr frank 02/29/20 at 150 pm. . Advised patient, providing education and rationale, to check cbg at least once daily since he has not been checking at all and record, calling the office  for findings outside established parameters.   . Referral made to pharmacy team for assistance with Lantus and Tradjenta . Review of patient status, including review of consultants reports, relevant laboratory and other test results, and medications completed. . 04/29/20 . Patient states that he feels bad his blood sugar was in the 200's this morning.but can be in the 3-400's.  He is not understanding why he feels so bad and has no energy, weak, urinating a lot, blurred vision. [name] He states that he was taking the medication as it was prescribed but it made him feel worse.  The last few days he has been worrying smoking cigarettes, not eating not taking meds, and everyday it compounds. [name]  He states that he lost his job and he does not know what to do. . Advised the patient that he needs to see a physician and express his issues. Patient agreed.  RNCM has the patient an appoint for Thursday 05/01/20 @ 150 in access to care with Peggyann Shoals MD.to be there early to register. . Encouraged the patient to eat take his meds.  Explain that his body needs to get use to his blood sugar  being lower that in the 500's as it had been for a while and it will not make him feel good.  Once he gets adjusted to the lower range he will feel better.     Patient Self Care Activities:  . UNABLE to independently self manage diabetes . Self administers oral medications as prescribed  Please see past updates related to this goal by clicking on the "Past Updates" button in the selected goal          The care management team will reach out to the patient again over the next 14 days.   [name] RN, BSN, [facility] Care Management Coordinator [facility] Phone: [phone] Fax: [phone]

## 2020-04-29 NOTE — Patient Instructions (Signed)
Visit Information  Goals Addressed              This Visit's Progress   .  I am tired all the time (pt-stated)        CARE PLAN ENTRY (see longtitudinal plan of care for additional care plan information)  Objective:  Lab Results  Component Value Date   HGBA1C 14.2 (A) 01/24/2020 .   Lab Results  Component Value Date   CREATININE 0.98 01/29/2020   CREATININE 0.78 01/26/2020   CREATININE 0.87 01/25/2020 .   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  He does not eat the correct things because all he has around him is food at the job evetrhing else is closed.  He does not check his blood sugars as he should. . Knowledge Deficits related to medications used for management of diabetes . Difficulty obtaining or cannot afford medications . Financial Constraints  Case Manager Clinical Goal(s):  Over the next 90 days, patient will demonstrate improved adherence to prescribed treatment plan for diabetes self care/management as evidenced by: lowering his a1c by 1-2 points . daily monitoring and recording of CBG  . adherence to prescribed medication regimen  Interventions:  . Provided education to patient about basic DM disease process . Reviewed medications with patient and discussed importance of medication adherence . Discussed plans with patient for ongoing care management follow up and provided patient with direct contact information for care management team . Reviewed scheduled/upcoming provider appointments including: [name] 02/25/20 at 1030 am/ Dr frank 02/29/20 at 150 pm. . Advised patient, providing education and rationale, to check cbg at least once daily since he has not been checking at all and record, calling the office  for findings outside established parameters.    . Referral made to pharmacy team for assistance with Lantus and Tradjenta . Review of patient status, including review of consultants reports, relevant laboratory and other test results, and medications completed. . 04/29/20 . Patient states that he feels bad his blood sugar was in the 200's this morning.but can be in the 3-400's.  He is not understanding why he feels so bad and has no energy, weak, urinating a lot, blurred vision. Matthew Lynch He states that he was taking the medication as it was prescribed but it made him feel worse.  The last few days he has been worrying smoking cigarettes, not eating not taking meds, and everyday it compounds. . He states that he lost his job and he does not know what to do. . Advised the patient that he needs to see a physician and express his issues. Patient agreed.  RNCM has the patient an appoint for Thursday 05/01/20 @ 150 in access to care with Peggyann Shoals MD.to be there early to register. . Encouraged the patient to eat take his meds.  Explain that his body needs to get use to his blood sugar being lower that in the 500's as it had been for a while and it will not make him feel good.  Once he gets adjusted to the lower range he will feel better.     Patient Self Care Activities:  . UNABLE to independently self manage diabetes . Self administers oral medications as prescribed  Please see past updates related to this goal  by clicking on the "Past Updates" button in the selected goal         [name] was given information about Care Management services today including:  1. Care Management services include personalized support from designated clinical staff supervised by his physician, including individualized plan of care and coordination with other care providers 2. 24/7 contact phone numbers for assistance for urgent and routine care needs. 3. The patient may stop CCM services at any time (effective at the end of the month) by phone call to the office  staff.  Patient agreed to services and verbal consent obtained.   The patient verbalized understanding of instructions provided today and declined a print copy of patient instruction materials.   The care management team will reach out to the patient again over the next 14 days.   [name] RN, BSN, [facility] Care Management Coordinator [facility] Phone: [phone] Fax: [phone]

## 2020-05-01 ENCOUNTER — Ambulatory Visit: Payer: BC Managed Care – PPO

## 2020-05-01 NOTE — Progress Notes (Deleted)
    SUBJECTIVE:   CHIEF COMPLAINT / HPI:   ***  PERTINENT  PMH / PSH: ***  OBJECTIVE:   There were no vitals taken for this visit.  ***  ASSESSMENT/PLAN:   No problem-specific Assessment & Plan notes found for this encounter.     Saree Krogh N Saquoia Sianez, DO [facility]  

## 2020-05-09 ENCOUNTER — Telehealth: Payer: Self-pay

## 2020-05-09 NOTE — Telephone Encounter (Signed)
Patient calls nurse line requesting to scheduled appointment as soon as possible for diabetes follow up. Patient states that blood sugar has been out of control, ranging between 250's to 400's. Patient also reports blurry vision that has been going on for over one year.   Patient scheduled for first available appointment next week.   Strict ED precautions provided.   Veronda Prude, RN

## 2020-05-13 ENCOUNTER — Ambulatory Visit (INDEPENDENT_AMBULATORY_CARE_PROVIDER_SITE_OTHER): Payer: Self-pay | Admitting: Family Medicine

## 2020-05-13 ENCOUNTER — Encounter: Payer: Self-pay | Admitting: Family Medicine

## 2020-05-13 ENCOUNTER — Other Ambulatory Visit: Payer: Self-pay

## 2020-05-13 DIAGNOSIS — Z794 Long term (current) use of insulin: Secondary | ICD-10-CM

## 2020-05-13 DIAGNOSIS — E1165 Type 2 diabetes mellitus with hyperglycemia: Secondary | ICD-10-CM

## 2020-05-13 DIAGNOSIS — R109 Unspecified abdominal pain: Secondary | ICD-10-CM

## 2020-05-13 LAB — POCT UA - MICROSCOPIC ONLY

## 2020-05-13 LAB — POCT URINALYSIS DIP (MANUAL ENTRY)
Bilirubin, UA: NEGATIVE
Glucose, UA: >=1000 mg/dL — AB
Leukocytes, UA: NEGATIVE
Nitrite, UA: NEGATIVE
Protein Ur, POC: NEGATIVE mg/dL
Spec Grav, UA: 1.01 (ref 1.010–1.025)
Urobilinogen, UA: 0.2 E.U./dL
pH, UA: 6 (ref 5.0–8.0)

## 2020-05-13 LAB — POCT GLYCOSYLATED HEMOGLOBIN (HGB A1C): HbA1c, POC (controlled diabetic range): 14 % — AB (ref 0.0–7.0)

## 2020-05-13 LAB — GLUCOSE, POCT (MANUAL RESULT ENTRY): POC Glucose: 366 mg/dl — AB (ref 70–99)

## 2020-05-13 NOTE — Progress Notes (Signed)
   SUBJECTIVE:   CHIEF COMPLAINT / HPI:   Chief Complaint  Patient presents with  . Follow-up    diabetes     Matthew Lynch is a 49 y.o. male here for diabetes follow up.   Diabetes Mellitus  States he is afraid of needles and it is hard for him to take his insulin. States if other people are around they will administer his insulin. Reports taking insulin maybe 3 out of 7 days a week.  Takes his oral medication however. Blood sugars over the last week ranged from HIGH-228. According to his glucometer is 2 week average is 348. On 9/10 his glucometer read "HI".  He tries to take his blood sugars in the morning. Endorses increased weakness, polyphagia, polyuria, excessive sleeping, abdominal pain, headache, blurry vision, low back pain, fatigue, numbness in his toes and hands. Says these sx are not new but feels more tired and weak recently.      PERTINENT  PMH / PSH: reviewed and updated as appropriate   OBJECTIVE:   BP 94/60   Pulse 93   Ht 5\' 5"  (1.651 m)   Wt 106 lb 4 oz (48.2 kg)   SpO2 96%   BMI 17.68 kg/m    GEN: well developed, thin male in no acute distress  CV: regular rate and rhythm, no murmurs appreciated RESP: no increased work of breathing, clear to ascultation bilaterally, no Kussmaul breathing ABD: Bowel sounds present. Soft, generalized tenderness, No CVA tenderness  MSK: thin extremities  SKIN: warm, dry, good perfusion  NEURO: alert, oriented x4, grossly normal, moves all extremities appropriately    ASSESSMENT/PLAN:   Type 2 diabetes mellitus with hyperglycemia (HCC) Uncontrolled. CBG today 366. States he took insulin this morning. UA +ketones and >1000 glucose. Will obtain CBC and CMP.  Patient not compliant with medications. He understands the disease process and outcomes of continued non-compliance. Takes insulin ~37 days a week. Patient does not want to end up on dialysis. He has trouble giving himself insulin. Takes insulin if someone else gives it  to him. Follows with chronic care management. Will ask if patient can get home health aid to assist with medication administration. Patient understands this is not a longterm fix.   Continue current regimen: Lantus 20u, Metformin and Tradjenta.  A1c today 14 and previously 14.2.  Counseled on need to take medications daily.   Statin therapy: Crestor ACEi/ARB: none Foot exam: UTD Urine microalbumine: UTD Eye exam: Advised to schedule eye exam as he has ongoing blurred vision.   - Follow up in 2-3 days.  - Encouraged oral hydration - If labs concerning for HHS will have him go to the ED    Depression: PHQ9 SCORE ONLY 05/13/2020 03/27/2020 01/29/2020  PHQ-9 Total Score 24 22 24   Will readdress at follow up visit. No SI. Continue Prozac daily.     03/30/2020, DO PGY-2, [facility] 05/13/2020

## 2020-05-13 NOTE — Assessment & Plan Note (Signed)
Uncontrolled. CBG today 366. States he took insulin this morning. UA +ketones and >1000 glucose. Will obtain CBC and CMP.  Patient not compliant with medications. He understands the disease process and outcomes of continued non-compliance. Takes insulin ~37 days a week. Patient does not want to end up on dialysis. He has trouble giving himself insulin. Takes insulin if someone else gives it to him. Follows with chronic care management. Will ask if patient can get home health aid to assist with medication administration. Patient understands this is not a longterm fix.   Continue current regimen: Lantus 20u, Metformin and Tradjenta.  A1c today 14 and previously 14.2.  Counseled on need to take medications daily.   Statin therapy: Crestor ACEi/ARB: none Foot exam: UTD Urine microalbumine: UTD Eye exam: Advised to schedule eye exam as he has ongoing blurred vision.   - Follow up in 2-3 days.  - Encouraged oral hydration - If labs concerning for HHS will have him go to the ED

## 2020-05-13 NOTE — Patient Instructions (Signed)
It was great seeing you today!  Please check-out at the front desk before leaving the clinic. I'd like to see you back in on Friday but if you need to be seen earlier than that for any new issues we're happy to fit you in, just give [place] a call!  Visit Remembers: - It is VITAL for you to take your medications on a daily basis  - Continue to work on your healthy eating habits and incorporating exercise into your daily life.  Regarding lab work today:  Due to recent changes in healthcare laws, you may see the results of your imaging and laboratory studies on MyChart before your provider has had a chance to review them.  I understand that in some cases there may be results that are confusing or concerning to you. Not all laboratory results come back in the same time frame and you may be waiting for multiple results in order to interpret others.  Please give [place] 72 hours in order for your provider to thoroughly review all the results before contacting the office for clarification of your results. If everything is normal, you will get a letter in the mail or a message in My Chart. Please give [place] a call if you do not hear from [place] after 2 weeks.  Please bring all of your medications with you to each visit.    If you haven't already, sign up for My Chart to have easy access to your labs results, and communication with your primary care physician.  Feel free to call with any questions or concerns at any time, at [phone].   Take care,  Dr. Katherina [facility]

## 2020-05-13 NOTE — Assessment & Plan Note (Signed)
>>  ASSESSMENT AND PLAN FOR TYPE 2 DIABETES MELLITUS WITH HYPERGLYCEMIA (HCC) WRITTEN ON 05/13/2020  8:00 PM BY BRIMAGE, VONDRA, DO  Uncontrolled. CBG today 366. States he took insulin this morning. UA +ketones and >1000 glucose. Will obtain CBC and CMP.  Patient not compliant with medications. He understands the disease process and outcomes of continued non-compliance. Takes insulin ~37 days a week. Patient does not want to end up on dialysis. He has trouble giving himself insulin. Takes insulin if someone else gives it to him. Follows with chronic care management. Will ask if patient can get home health aid to assist with medication administration. Patient understands this is not a longterm fix.   Continue current regimen: Lantus 20u, Metformin and Tradjenta.  A1c today 14 and previously 14.2.  Counseled on need to take medications daily.   Statin therapy: Crestor ACEi/ARB: none Foot exam: UTD Urine microalbumine: UTD Eye exam: Advised to schedule eye exam as he has ongoing blurred vision.   - Follow up in 2-3 days.  - Encouraged oral hydration - If labs concerning for [facility]HS will have him go to the ED

## 2020-05-14 ENCOUNTER — Ambulatory Visit: Payer: Self-pay

## 2020-05-14 ENCOUNTER — Telehealth: Payer: BC Managed Care – PPO

## 2020-05-14 LAB — COMPREHENSIVE METABOLIC PANEL
ALT: 67 IU/L — ABNORMAL HIGH (ref 0–44)
AST: 35 IU/L (ref 0–40)
Albumin/Globulin Ratio: 1.7 (ref 1.2–2.2)
Albumin: 5 g/dL (ref 4.0–5.0)
Alkaline Phosphatase: 101 IU/L (ref 44–121)
BUN/Creatinine Ratio: 11 (ref 9–20)
BUN: 10 mg/dL (ref 6–24)
Bilirubin Total: 0.5 mg/dL (ref 0.0–1.2)
CO2: 25 mmol/L (ref 20–29)
Calcium: 10.5 mg/dL — ABNORMAL HIGH (ref 8.7–10.2)
Chloride: 90 mmol/L — ABNORMAL LOW (ref 96–106)
Creatinine, Ser: 0.88 mg/dL (ref 0.76–1.27)
GFR calc Af Amer: 118 mL/min/{1.73_m2} (ref >59)
GFR calc non Af Amer: 102 mL/min/{1.73_m2} (ref >59)
Globulin, Total: 3 g/dL (ref 1.5–4.5)
Glucose: 351 mg/dL — ABNORMAL HIGH (ref 65–99)
Potassium: 4.5 mmol/L (ref 3.5–5.2)
Sodium: 131 mmol/L — ABNORMAL LOW (ref 134–144)
Total Protein: 8 g/dL (ref 6.0–8.5)

## 2020-05-14 LAB — CBC
Hematocrit: 49.6 % (ref 37.5–51.0)
Hemoglobin: 16.9 g/dL (ref 13.0–17.7)
MCH: 29.9 pg (ref 26.6–33.0)
MCHC: 34.1 g/dL (ref 31.5–35.7)
MCV: 88 fL (ref 79–97)
Platelets: 255 10*3/uL (ref 150–450)
RBC: 5.66 x10E6/uL (ref 4.14–5.80)
RDW: 11.9 % (ref 11.6–15.4)
WBC: 5.2 10*3/uL (ref 3.4–10.8)

## 2020-05-15 NOTE — Patient Instructions (Signed)
Visit Information  Goals Addressed              This Visit's Progress   .  I am tired all the time (pt-stated)        CARE PLAN ENTRY (see longtitudinal plan of care for additional care plan information)  Objective:  Lab Results  Component Value Date   HGBA1C 14.2 (A) 01/24/2020 .   Lab Results  Component Value Date   CREATININE 0.98 01/29/2020   CREATININE 0.78 01/26/2020   CREATININE 0.87 01/25/2020 .   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  He does not eat the correct things because all he has around him is food at the job evetrhing else is closed.  He does not check his blood sugars as he should. . Knowledge Deficits related to medications used for management of diabetes . Difficulty obtaining or cannot afford medications . Financial Constraints  Case Manager Clinical Goal(s):  Over the next 90 days, patient will demonstrate improved adherence to prescribed treatment plan for diabetes self care/management as evidenced by: lowering his a1c by 1-2 points . daily monitoring and recording of CBG  . adherence to prescribed medication regimen  Interventions:  . Provided education to patient about basic DM disease process . Reviewed medications with patient and discussed importance of medication adherence . Discussed plans with patient for ongoing care management follow up and provided patient with direct contact information for care management team . Reviewed scheduled/upcoming provider appointments including: [name] 02/25/20 at 1030 am/ Dr frank 02/29/20 at 150 pm. . Advised patient, providing education and rationale, to check cbg at least once daily since he has not been checking at all and record, calling the office  for findings outside established parameters.    . Referral made to pharmacy team for assistance with Lantus and Tradjenta . Review of patient status, including review of consultants reports, relevant laboratory and other test results, and medications completed. [name] Spoke with the patient and he states that he was seen on 9/14 in the office and his a1c went down by 2 points.  He feels that his body in rejecting the insulin.  [name] He states that he still fell weak tired and stressed with all the things going on in his life. [name] He did state that he had a reading of 108 for his blood sugar but he said he had nothing to eat after 4 pm and he only drank water. [name] He states that he is tired of worrying about things he said " I am going to let go and let God". . He has an appointment in the office on 05/16/20 that he plans to attend for his blood sugar follow up. . Encouraged the patient to take his medications as prescribed, monitor his diet, stay hydrated, and try to get some exercise if possible.   Patient Self Care Activities:  . UNABLE to independently self manage diabetes . Self administers oral medications as prescribed  Please see past updates related to this goal by clicking on the "Past Updates" button in the selected goal         Mr. Coster was given information about Care Management services today including:  1. Care Management services include personalized support from designated clinical staff supervised by  his physician, including individualized plan of care and coordination with other care providers 2. 24/7 contact phone numbers for assistance for urgent and routine care needs. 3. The patient may stop CCM services at any time (effective at the end of the month) by phone call to the office staff.  Patient agreed to services and verbal consent obtained.   The patient verbalized understanding of instructions provided today and declined a print copy of patient instruction materials.   The care management team will reach out to the  patient again over the next 14 days.   [name] RN, BSN, [facility] Care Management Coordinator [facility] Phone: [phone] Fax: [phone]

## 2020-05-15 NOTE — Chronic Care Management (AMB) (Signed)
Care Management   Follow Up Note   05/15/2020 Name: Matthew Lynch MRN: [id number] DOB: 01/04/1971  Referred by: [name], MD Reason for referral : Chronic Care Management (DM II)   Matthew Lynch is a 49 y.o. year old male who is a primary care patient of [name], MD. The care management team was consulted for assistance with care management and care coordination needs.    Review of patient status, including review of consultants reports, relevant laboratory and other test results, and collaboration with appropriate care team members and the patient's provider was performed as part of comprehensive patient evaluation and provision of chronic care management services.    SDOH (Social Determinants of Health) assessments performed: No See Care Plan activities for detailed interventions related to [facility])     Advanced Directives: See Care Plan and Vynca application for related entries.   Goals Addressed              This Visit's Progress   .  I am tired all the time (pt-stated)        CARE PLAN ENTRY (see longtitudinal plan of care for additional care plan information)  Objective:  Lab Results  Component Value Date   HGBA1C 14.2 (A) 01/24/2020 .   Lab Results  Component Value Date   CREATININE 0.98 01/29/2020   CREATININE 0.78 01/26/2020   CREATININE 0.87 01/25/2020 .   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  He does not eat the correct things because all he has around him is food at the job evetrhing else is closed.  He does not check his blood sugars as he should. . Knowledge Deficits related to medications used for management of diabetes . Difficulty obtaining or cannot afford medications . Financial Constraints  Case Manager Clinical Goal(s):    Over the next 90 days, patient will demonstrate improved adherence to prescribed treatment plan for diabetes self care/management as evidenced by: lowering his a1c by 1-2 points . daily monitoring and recording of CBG  . adherence to prescribed medication regimen  Interventions:  . Provided education to patient about basic DM disease process . Reviewed medications with patient and discussed importance of medication adherence . Discussed plans with patient for ongoing care management follow up and provided patient with direct contact information for care management team . Reviewed scheduled/upcoming provider appointments including: [name] 02/25/20 at 1030 am/ Dr frank 02/29/20 at 150 pm. . Advised patient, providing education and rationale, to check cbg at least once daily since he has not been checking at all and record, calling the office  for findings outside established parameters.   . Referral made to pharmacy team for assistance with Lantus and Tradjenta . Review of patient status, including review of consultants reports, relevant laboratory and other test results, and medications completed. Matthew Lynch Spoke with the patient and he states that he was seen on 9/14 in the office and his a1c went down by 2 points.  He feels that his body in rejecting the insulin.  Matthew Lynch He states that he still fell weak tired and stressed with all the things going on in his life. Matthew Lynch He did state that he had a reading of 108 for his blood sugar but he said he had nothing to eat after  4 pm and he only drank water. Matthew Lynch He states that he is tired of worrying about things he said " I am going to let go and let God". . He has an appointment in the office on 05/16/20 that he plans to attend for his blood sugar follow up. . Encouraged the patient to take his medications as prescribed, monitor his diet, stay hydrated, and try to get some exercise if possible.   Patient Self Care Activities:  . UNABLE to independently self manage  diabetes . Self administers oral medications as prescribed  Please see past updates related to this goal by clicking on the "Past Updates" button in the selected goal          The care management team will reach out to the patient again over the next 14 days.    Matthew Fairly RN, BSN, [facility] Care Management Coordinator [facility] Phone: [phone] Fax: [phone]

## 2020-05-16 ENCOUNTER — Other Ambulatory Visit: Payer: Self-pay

## 2020-05-16 ENCOUNTER — Ambulatory Visit (INDEPENDENT_AMBULATORY_CARE_PROVIDER_SITE_OTHER): Payer: Self-pay | Admitting: Family Medicine

## 2020-05-16 ENCOUNTER — Encounter: Payer: Self-pay | Admitting: Family Medicine

## 2020-05-16 VITALS — BP 116/60 | HR 82 | Wt 106.0 lb

## 2020-05-16 DIAGNOSIS — E1165 Type 2 diabetes mellitus with hyperglycemia: Secondary | ICD-10-CM

## 2020-05-16 DIAGNOSIS — E1169 Type 2 diabetes mellitus with other specified complication: Secondary | ICD-10-CM

## 2020-05-16 DIAGNOSIS — E785 Hyperlipidemia, unspecified: Secondary | ICD-10-CM

## 2020-05-16 DIAGNOSIS — Z794 Long term (current) use of insulin: Secondary | ICD-10-CM

## 2020-05-16 LAB — GLUCOSE, POCT (MANUAL RESULT ENTRY): POC Glucose: 305 mg/dl — AB (ref 70–99)

## 2020-05-16 MED ORDER — LINAGLIPTIN 5 MG PO TABS: 5.0000 mg | ORAL_TABLET | Freq: Every day | ORAL | 0 refills | Status: DC

## 2020-05-16 MED ORDER — ROSUVASTATIN CALCIUM 20 MG PO TABS: 20.0000 mg | ORAL_TABLET | Freq: Every day | ORAL | 3 refills | Status: DC

## 2020-05-16 MED ORDER — FLUOXETINE HCL 40 MG PO CAPS
40.0000 mg | ORAL_CAPSULE | Freq: Every day | ORAL | 3 refills | Status: DC
Start: 2020-05-16 — End: 2020-08-07

## 2020-05-16 NOTE — Progress Notes (Signed)
    SUBJECTIVE:   CHIEF COMPLAINT / HPI:   Diabetes: Matthew Lynch is a 49 yo male here for check up on his diabetes. He had a clinic visit 3 days ago with Matthew Lynch where his CBG was 366. States he feels better today than he did 3 days ago. Still has some weakness and fatigue, numbness in toes and hands, and chronic vision changes since his DM diagnosis 2 years ago. He states he missed his eye appointment recently.  Depression: States he has been very depressed. Reports having 10 kids and a lot to worry about financially. States he is working with Matthew Lynch on getting medication assistance and help with other resources.   Tobacco Use: Endorses increasing his smoking to 10 cigarettes a day from 6 about a week ago due to increased stress.   PERTINENT  PMH / PSH: DM2, hyperlipidemia, tobacco use, anxiety, depression  OBJECTIVE:   BP 116/60   Pulse 82   Wt 106 lb (48.1 kg)   SpO2 99%   BMI 17.64 kg/m    Physical Exam Constitutional:      General: He is not in acute distress.    Appearance: He is not ill-appearing.  Cardiovascular:     Rate and Rhythm: Normal rate and regular rhythm.     Pulses: Normal pulses.     Heart sounds: Normal heart sounds.  Pulmonary:     Effort: Pulmonary effort is normal.     Breath sounds: Normal breath sounds.  Skin:    General: Skin is warm and dry.    ASSESSMENT/PLAN:   No problem-specific Assessment & Plan notes found for this encounter.   DM2 Last A1C 14 on 9/14. CBG today 305. Patient met with pharmacist Matthew Lynch on 7/29  and was shown proper insulin administration techniques and was  provided with  - Continue Lantus 20 units at bedtime - Continue Metformin 500 mg XR (2 tablets BID with meals) - Continue Trajenta 5 mg daily - advised pt to reschedule appointment with ophthalmologist  - gave precautions on when to go to the ED   Depression Patient reports worsening depression with his health, and all that he has going on with his  kids and finances. Scored 24 on PHQ9. Denies suicidal and homicidal ideations  - Increased Fluoxetine from $RemoveBeforeD'20mg'RuViTjcVFuSzAq$  to $R'40mg'Ka$  - encouraged continuing going to counseling support groups  Tobacco use disorder  Pt currently smoking 10 cigarettes a day, up from 6 about 1-2 weeks ago. States - refused medication assistance to help with quitting or speaking to someone about it.   Hyperlipidemia - Crestor $RemoveB'20mg'pEsepHTs$  daily   Patient to follow up on his diabetes with his PCP in about 2 weeks  Matthew Lynch, Matthew Lynch

## 2020-05-16 NOTE — Patient Instructions (Signed)
It was great seeing you today! Today we checked on your blood sugars, refilled medication, and increased your Fluoxetine.   Please continue to check your sugars daily. For every morning your sugars are above 150, increase your nightly insulin dose by 1 unit.   Please check-out at the front desk before leaving the clinic. Please return to the clinic in 2 weeks to follow up on your diabetes, but if you need to be seen earlier than that for any new issues we're happy to fit you in, just give [place] a call!   Visit Reminders: - Stop by the pharmacy to pick up your prescriptions - Continue to work on your healthy eating habits and incorporating exercise into your daily life.  - Medicine Changes: We have increased your Fluoxetine from 20 to 40mg  daily - To Do: Schedule appointment with your ophthalmologist    Please bring all of your medications with you to each visit.    If you haven't already, sign up for My Chart to have easy access to your labs results, and communication with your primary care physician.  Feel free to call with any questions or concerns at any time, at [phone].   Take care,  Dr. [phone] [facility]

## 2020-05-27 ENCOUNTER — Ambulatory Visit: Payer: Self-pay

## 2020-05-27 NOTE — Chronic Care Management (AMB) (Signed)
   RNCM Care Management Collaboration 05/27/2020 Name: Matthew Lynch MRN: [id number] DOB: 12-24-1970   Matthew Lynch is a 49 y.o. year old male who sees [name], MD for primary care. RNCM was consulted by Feliz Beam referral coordinator to assistance patient with  Care Coordination for Home Health. After speaking with Jazmin letting her her know that home health is not an option with no insurance unless the patient is willing to pay out of pocket.  The patient has used insulin in the past and seen Pete  Koval Pharm D and would benefit with another Collaborated with LCSW Sammuel Hines.    Intervention: Patient was not interviewed or contacted during this encounter.  Review of patient status, including review of consultants reports, relevant laboratory and other test results, and collaboration with appropriate care team members and the patient's provider was performed as part of comprehensive patient evaluation and provision of chronic care management services.     Plan:  1. The patient will be introduced to [employer] and wellness for help with medication assistance. Patient can meet with [name] after meeting with PCP on 05-30-20 2. LCSW will give patient paperwork for [employer] [employer] will follow up as needed. 3.   Follow up with RNCM on 06-04-20.       [name] RN, BSN, [employer] Care Management Coordinator [facility] Phone: [phone] Fax: [phone]

## 2020-05-28 ENCOUNTER — Ambulatory Visit: Payer: Self-pay

## 2020-05-29 NOTE — Chronic Care Management (AMB) (Signed)
  Care Management     Care Management Outreach Note  05/29/2020  Late Entry  Name: Romy Mcgue MRN: [id number] DOB: September 27, 1970  Barron Vanloan is a 49 y.o. year old male who is a primary care patient of [name], MD . The Care Management team was consulted for assistance with . Care Coordination for Home Health.  RN Care Manager reached out to [name] on 05/28/20 by phone to inform him that he does not qualify for home health due to no insurance.  I advised  him that I put in the mail to him a [employer] packet with instructions of what he needs to do and papers he will need ( address verified ).  I explained that if approved that this can help him with bills and office visits.  As a reminder I asked him to keep his appointment with his physician on 05-30-20.  He stated he would.   Follow Up Plan: RNCM has a follow up visit on 06-04-20  [name] RN, BSN, [facility] Care Management Coordinator [facility] Phone: [phone] Fax: [phone]

## 2020-05-30 ENCOUNTER — Ambulatory Visit: Payer: Self-pay | Admitting: Family Medicine

## 2020-06-04 ENCOUNTER — Telehealth: Payer: Self-pay

## 2020-06-04 NOTE — Telephone Encounter (Signed)
°  Chronic Care Management   Note  06/04/2020 Name: Matthew Lynch MRN: [id number] DOB: 1971-05-18  RNCM reached out to the patient today for follow up.  He stated that he was at another appointment and was unable to talk.  I advised the patient that once he was finished that he could give me a call.  I did ask had he received the financial packet I sent to him.  He stated that he did not know he would check this afternoon.     Follow up plan: RNCM will wait for the patient to give me a call back.  If the patient has not given a call back in 5 days RNCM will call the patient.  [name] RN, BSN, [facility] Care Management Coordinator [facility] Phone: [phone] Fax: [phone]

## 2020-06-11 ENCOUNTER — Telehealth: Payer: Self-pay

## 2020-06-11 NOTE — Telephone Encounter (Signed)
  Care Management   Outreach Note  06/11/2020 Name: Matthew Lynch MRN: [id number] DOB: [birth year]  Referred by: [name], MD Reason for referral : Appointment (DM II)   An unsuccessful telephone outreach was attempted today. The patient was referred to the case management team for assistance with care management and care coordination.  Unable to leave a message due to mailbox full.  Follow Up Plan: The care management team will reach out to the patient again over the next 7-14 days.   [name] RN, BSN, [facility] Care Management Coordinator [facility] Phone: [phone] Fax: [phone]  [name] RN, BSN, [facility] Care Management Coordinator [facility] Phone: [phone] Fax: [phone]

## 2020-06-24 ENCOUNTER — Ambulatory Visit: Payer: Self-pay

## 2020-06-24 NOTE — Patient Instructions (Signed)
Visit Information  Goals Addressed              This Visit's Progress   .  I am tired all the time (pt-stated)        CARE PLAN ENTRY (see longtitudinal plan of care for additional care plan information)  Objective:  Lab Results  Component Value Date   HGBA1C 14.0 (A) 05/13/2020 .   Lab Results  Component Value Date   CREATININE 0.88 05/13/2020   CREATININE 1.11 03/24/2020   CREATININE 1.02 03/21/2020 .   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  He does not eat the correct things because all he has around him is food at the job evetrhing else is closed.  He does not check his blood sugars as he should. . Knowledge Deficits related to medications used for management of diabetes . Difficulty obtaining or cannot afford medications . Financial Constraints  Case Manager Clinical Goal(s):  Over the next 90 days, patient will demonstrate improved adherence to prescribed treatment plan for diabetes self care/management as evidenced by: lowering his a1c by 1-2 points . daily monitoring and recording of CBG  . adherence to prescribed medication regimen  Interventions:  . Provided education to patient about basic DM disease process . Reviewed medications with patient and discussed importance of medication adherence . Discussed plans with patient for ongoing care management follow up and provided patient with direct contact information for care management team . Reviewed scheduled/upcoming provider appointments including: Matthew Lynch 02/25/20 at 1030 am/ Matthew Lynch 02/29/20 at 150 pm. . Advised patient, providing education and rationale, to check cbg at least once daily since he has not been checking at all and record, calling the office  for findings outside established parameters.    . Referral made to pharmacy team for assistance with Lantus and Tradjenta . Review of patient status, including review of consultants reports, relevant laboratory and other test results, and medications completed. [name] Spoke with the patient and he states that he is still having a hard time.  Every thing in his life is crazy.  He said "I really has not been paying to much attention to my health."  He said that he has been taking the pills and taking his shots.  He checked his blood sugar this morning and it was 400. We discussed signs and symptoms of hyperglycemia and diet and how good he did by having his blood sugars in the 200's.  He stated that he did not feel good when his blood sugars where in that range.  Explained his body needed to adjust to being in the lower range. He stated that the doctor had explained that to him. . Patient stated that he did receive the [employer] papers and he would work on filling those out today. . Advised the patient to get an appointment to follow up in the office soon.  He verbalized understanding. . Collaborated with Matthew Hines LCSW.  She will reach out to the patient.  Patient Self Care Activities:  . UNABLE to independently self manage diabetes . Self administers oral medications as prescribed  Please see past updates related to this goal by clicking on the "Past Updates" button in the selected goal  Matthew Lynch was given information about Care Management services today including:  1. Care Management services include personalized support from designated clinical staff supervised by his physician, including individualized plan of care and coordination with other care providers 2. 24/7 contact phone numbers for assistance for urgent and routine care needs. 3. The patient may stop CCM services at any time (effective at the end of the month) by phone call to the office staff.  Patient agreed to services and verbal consent obtained.   The  patient verbalized understanding of instructions provided today and declined a print copy of patient instruction materials.   Plan: Telephone follow up with Matthew Lynch over the next 14 days.Matthew Fairly RN, BSN, [facility] Care Management Coordinator [facility] Phone: [phone] Fax: [phone]

## 2020-06-24 NOTE — Chronic Care Management (AMB) (Signed)
RN  Care Management   Follow Up Note   06/24/2020 Name: Matthew Lynch MRN: [id number] DOB: Oct 16, 1970  Reason for referral : Chronic Care Management (DM II)   Matthew Lynch is a 49 y.o. year old male who is a primary care patient of [name], MD. The care management team was consulted for assistance with care management and care coordination needs.    Subjective: " My world is crazy"  Assessment: Called the patient to follow up on his DM II.  He states that there is so much going on in his life right now " I have not focused on my health."  Goals Addressed              This Visit's Progress     I am tired all the time (pt-stated)        CARE PLAN ENTRY (see longtitudinal plan of care for additional care plan information)  Objective:  Lab Results  Component Value Date   HGBA1C 14.0 (A) 05/13/2020    Lab Results  Component Value Date   CREATININE 0.88 05/13/2020   CREATININE 1.11 03/24/2020   CREATININE 1.02 03/21/2020    Current Barriers:   Knowledge Deficits related to basic Diabetes pathophysiology and self care/management- patient states that 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  He does not eat the correct things because all he has around him is food at the job evetrhing else is closed.  He does not check his blood sugars as he should.  Knowledge Deficits related to medications used for management of diabetes  Difficulty obtaining or cannot afford medications  Financial Constraints  Case Manager Clinical Goal(s):  Over the next 90 days, patient will demonstrate improved adherence to prescribed treatment plan for diabetes self care/management as evidenced by: lowering his a1c by 1-2 points  daily monitoring and recording of CBG   adherence to prescribed medication regimen  Interventions:   Provided education to patient about basic DM disease  process  Reviewed medications with patient and discussed importance of medication adherence  Discussed plans with patient for ongoing care management follow up and provided patient with direct contact information for care management team  Reviewed scheduled/upcoming provider appointments including: Matthew Lynch 02/25/20 at 1030 am/ Dr frank 02/29/20 at 150 pm.  Advised patient, providing education and rationale, to check cbg at least once daily since he has not been checking at all and record, calling the office  for findings outside established parameters.    Referral made to pharmacy team for assistance with Lantus and Tradjenta  Review of patient status, including review of consultants reports, relevant laboratory and other test results, and medications completed.  Spoke with the patient and he states that he is still having a hard time.  Every thing in his life is crazy.  He said "I really has not been paying to much attention to my health."  He said that he has been taking the pills and taking his shots.  He checked his blood sugar this morning and it was 400. We discussed signs and symptoms of hyperglycemia and diet and how good he did by having his blood sugars in the 200's.  He stated that he did not feel good when his blood sugars where in that range.  Explained his body needed to adjust to being in the lower range. He stated that  the doctor had explained that to him.  Patient stated that he did receive the [employer] papers and he would work on filling those out today.  Advised the patient to get an appointment to follow up in the office soon.  He verbalized understanding.  Collaborated with Sammuel Hines LCSW.  She will reach out to the patient.  Patient Self Care Activities:   UNABLE to independently self manage diabetes  Self administers oral medications as prescribed  Please see past updates related to this goal by clicking on the "Past Updates" button in the selected goal          Review of patient status, including review of consultants reports, relevant laboratory and other test results, and collaboration with appropriate care team members and the patient's provider was performed as part of comprehensive patient evaluation and provision of chronic care management services.    SDOH (Social Determinants of Health) assessments performed: No See Care Plan activities for detailed interventions related to SDOH)         Plan: Telephone follow up with [name] over the next 14 days.[name] RN, BSN, [facility] Care Management Coordinator [facility] Phone: [phone] Fax: [phone]

## 2020-07-01 ENCOUNTER — Ambulatory Visit: Payer: Self-pay | Admitting: Licensed Clinical Social Worker

## 2020-07-01 DIAGNOSIS — F439 Reaction to severe stress, unspecified: Secondary | ICD-10-CM

## 2020-07-01 NOTE — Chronic Care Management (AMB) (Signed)
   Clinical Social Work  Care Management referral   07/01/2020 Name: Matthew Lynch MRN: [id number] DOB: 1971/02/07  Matthew Lynch is a 49 y.o. year old male who is a primary care patient of [name], MD . LCSW was consulted by CCM RN to assistance patient with stressors.  LCSW reached out to [name] today by phone to introduce self, assess needs and offer Care Management services and interventions.    Assessment: Patient reports he was stressed but had to let everything go so that he could manage.  He continues to see therapist with [facility] which is helping with manage his stressors.  Patient states will take one day at a time.  No clinical goals established today. Intervention:  Assessment of needs and barriers as well as current state. Reminded patient of phone appointment with CCM RN next week Plan: Patient agreed to services provided today, however does not require or desire additional follow up.  Advance Directive Status:; not addressed during this encounter.  SDOH (Social Determinants of Health) assessments performed:No needs identified    Outpatient Encounter Medications as of 07/01/2020  Medication Sig  . Blood Glucose Monitoring Suppl (ONETOUCH VERIO) w/Device KIT 1 each by Does not apply route daily.  [name] FLUoxetine (PROZAC) 40 MG capsule Take 1 capsule (40 mg total) by mouth daily.  [name] glucose blood (ONETOUCH VERIO) test strip Check blood sugar 2-3 times daily  . insulin glargine (LANTUS) 100 UNIT/ML Solostar Pen Inject 20 Units into the skin daily.  . Insulin Pen Needle 29G X 12.7MM MISC 1 Stick by Does not apply route daily.  [name] linagliptin (TRADJENTA) 5 MG TABS tablet Take 1 tablet (5 mg total) by mouth daily.  . metFORMIN (GLUCOPHAGE-XR) 500 MG 24 hr tablet Take 2 tablets (1,000 mg total) by mouth 2 (two) times daily with a meal.  . ONETOUCH DELICA LANCETS 25R MISC Check blood sugar 2-3 times daily  . rosuvastatin (CRESTOR) 20 MG tablet Take 1  tablet (20 mg total) by mouth daily.   No facility-administered encounter medications on file as of 07/01/2020.   Review of patient status, including review of consultants reports, relevant laboratory and other test results, and collaboration with appropriate care team members and the patient's provider was performed as part of comprehensive patient evaluation and provision of care management services.    Casimer Lanius, [place] [facility] [place]   [phone] 3:44 PM

## 2020-07-08 ENCOUNTER — Telehealth: Payer: Self-pay

## 2020-07-08 NOTE — Telephone Encounter (Signed)
°  Care Management   Outreach Note  07/08/2020 Name: Matthew Lynch MRN: [id number] DOB: [birth year]  Referred by: [name], MD Reason for referral : Appointment (DM II)   An unsuccessful telephone outreach was attempted today. The patient was referred to the case management team for assistance with care management and care coordination.  Unable to leave a message the mailbox is full.  Follow Up Plan: The care management team will reach out to the patient again over the next 7-10 days.   [name] RN, BSN, [facility] Care Management Coordinator [facility] Phone: [phone] Fax: [phone]

## 2020-07-09 NOTE — Telephone Encounter (Signed)
Called patient X2. Spoke to patient he would like to reschedule with RN CM for a follow call as soon as possible he has not been taking medications or eating. Rescheduled for RN CM follow up call on 07/10/2020 with Traci.

## 2020-07-10 ENCOUNTER — Telehealth: Payer: Self-pay

## 2020-07-10 NOTE — Telephone Encounter (Signed)
°  Care Management   Outreach Note  07/10/2020 Name: Matthew Lynch MRN: [id number] DOB: 1971-01-07  Referred by: [name], MD Reason for referral : Chronic Care Management (DM II)   A second unsuccessful telephone outreach was attempted today. The patient was referred to the case management team for assistance with care management and care coordination.  Unable to leave a message mailbox full.  Follow Up Plan: The care management team will reach out to the patient again over the next 7-10 days.   [name] RN, BSN, [facility] Care Management Coordinator [facility] Phone: [phone] Fax: [phone]

## 2020-07-11 NOTE — Telephone Encounter (Signed)
Called and spoke with patient she is rescheduled for 07/30/2020 at 11:00 AM for follow up call with [name] RN CM.

## 2020-08-07 ENCOUNTER — Other Ambulatory Visit: Payer: Self-pay

## 2020-08-07 ENCOUNTER — Ambulatory Visit (INDEPENDENT_AMBULATORY_CARE_PROVIDER_SITE_OTHER): Payer: Self-pay | Admitting: Family Medicine

## 2020-08-07 ENCOUNTER — Encounter: Payer: Self-pay | Admitting: Family Medicine

## 2020-08-07 ENCOUNTER — Ambulatory Visit: Payer: Self-pay | Admitting: Licensed Clinical Social Worker

## 2020-08-07 ENCOUNTER — Other Ambulatory Visit: Payer: Self-pay | Admitting: Family Medicine

## 2020-08-07 VITALS — BP 102/62 | HR 95 | Wt 104.4 lb

## 2020-08-07 DIAGNOSIS — F419 Anxiety disorder, unspecified: Secondary | ICD-10-CM

## 2020-08-07 DIAGNOSIS — E1169 Type 2 diabetes mellitus with other specified complication: Secondary | ICD-10-CM

## 2020-08-07 DIAGNOSIS — E1165 Type 2 diabetes mellitus with hyperglycemia: Secondary | ICD-10-CM

## 2020-08-07 DIAGNOSIS — E785 Hyperlipidemia, unspecified: Secondary | ICD-10-CM

## 2020-08-07 DIAGNOSIS — Z794 Long term (current) use of insulin: Secondary | ICD-10-CM

## 2020-08-07 DIAGNOSIS — F321 Major depressive disorder, single episode, moderate: Secondary | ICD-10-CM

## 2020-08-07 LAB — POCT UA - MICROSCOPIC ONLY

## 2020-08-07 LAB — POCT GLYCOSYLATED HEMOGLOBIN (HGB A1C): Hemoglobin A1C: 13 % — AB (ref 4.0–5.6)

## 2020-08-07 LAB — POCT URINALYSIS DIP (MANUAL ENTRY)
Bilirubin, UA: NEGATIVE
Glucose, UA: >=1000 mg/dL — AB
Leukocytes, UA: NEGATIVE
Nitrite, UA: NEGATIVE
Protein Ur, POC: NEGATIVE mg/dL
Spec Grav, UA: 1.02 (ref 1.010–1.025)
Urobilinogen, UA: 0.2 E.U./dL
pH, UA: 5 (ref 5.0–8.0)

## 2020-08-07 MED ORDER — ROSUVASTATIN CALCIUM 20 MG PO TABS: 20.0000 mg | ORAL_TABLET | Freq: Every day | ORAL | 3 refills | Status: DC

## 2020-08-07 MED ORDER — FLUOXETINE HCL 40 MG PO CAPS: 40.0000 mg | ORAL_CAPSULE | Freq: Every day | ORAL | 3 refills | Status: DC

## 2020-08-07 MED ORDER — METFORMIN HCL ER 500 MG PO TB24: 1000.0000 mg | ORAL_TABLET | Freq: Two times a day (BID) | ORAL | 3 refills | Status: DC

## 2020-08-07 MED FILL — ROSUVASTATIN CALCIUM 20 MG: 20 | 30 days supply | Qty: 30 | Fill #0

## 2020-08-07 MED FILL — metFORMIN HCL ER 500 MG TB2: 500 | 30 days supply | Qty: 120 | Fill #0

## 2020-08-07 MED FILL — FLUoxetine HCL 40 MG CAPS: 40 | 30 days supply | Qty: 30 | Fill #0

## 2020-08-07 NOTE — Progress Notes (Deleted)
    SUBJECTIVE:   CHIEF COMPLAINT / HPI:     PERTINENT  PMH / PSH: ***  OBJECTIVE:   BP 102/62   Pulse 95   Wt 104 lb 6.4 oz (47.4 kg)   SpO2 98%   BMI 17.37 kg/m   ***  ASSESSMENT/PLAN:   No problem-specific Assessment & Plan notes found for this encounter.     Matthew Mo, MD [facility]

## 2020-08-07 NOTE — Progress Notes (Addendum)
SUBJECTIVE:   CHIEF COMPLAINT / HPI:   Matthew Lynch (MRN: [id number]) is a 49 y.o. male with a history of T2DM, HLD, and depression who presents today for a Hgb A1c checkup.  T2DM Patient states he has not been taking his medications as prescribed. He does not take the linagliptin due to financial constraints. He takes his metformin when he "feels like it" and does not take the lantus due to not liking needles. He states he would like someone to come to his house and give him his medications. He also is willing to come to the clinic each day to be administered the lantus. He endorses xerostomia, polyuria, polydipsia, mild abdominal pain, and nausea. He denies any fevers, chills, or vomiting.  Depression Patient reports struggling with his mental health after losing his job and insurance. He says that he feels like he is dying due to his diabetes and that he is tired all of the time. He has not been taking his prozac for his mood symptoms. He states that he feels like a "zombie" sometimes. He is also concerned with his memory, as sometimes he will be driving and forget where he is going.  PERTINENT  PMH / PSH: T2DM, HLD, depression  OBJECTIVE:   BP 102/62   Pulse 95   Wt 104 lb 6.4 oz (47.4 kg)   SpO2 98%   BMI 17.37 kg/m    PHYSICAL EXAM  GEN: Well developed, somber mood CVS: RRR, normal S1/S2, no murmurs, rubs, gallops RESP: Breathing comfortably on RA, no retractions, wheezes, rhonchi, or crackles ABD: Soft, mild abdominal tenderness, no organomegaly or masses  EXT: Moves all extremities equally  ASSESSMENT/PLAN:   Diabetes mellitus (HCC) Patient's Hgb A1c today is 13.0, down from 14.0 in September 2021. Marked glucosuria and small amount of ketones present on urinalysis. He reports taking metformin sporadically and not lantus or linagliptin at all. Counseled on importance of taking diabetes medications as prescribed and how high blood sugar is likely causing his fatigue,  abdominal pain and nausea, polyuria, polydipsia, and xerostomia. Patient acknowledged intent to take metformin as prescribed. Will follow up with patient if administering lantus at the clinic for 2 weeks will be possible. Filled rosuvastatin per recommendation for people over 40 with diabetes. BMP ordered to assess electrolytes and fluid balance. Follow up in 1 week to discuss progress.  Depression, major, single episode, moderate (HCC) Patient's total PHQ-9 score is 24 with 0 on #9. He reports feeling down after many life stressors. Patient acknowledges intent to restart prozac 40mg  daily; prescription sent in. Also amenable to begin seeing a psychiatrist for further management; referral sent to [facility].   [facility], Medical Student [facility]    I was present for the medical decision making and physical exam of this patient.  I agree with the above documentation by medical student, [facility], with the following additions:  Diabetes Current diabetic regimen includes: -Metformin 1000 mg twice daily -Lantus 20 units daily -Linagliptin 5 mg daily He does not check his blood sugar regularly.  He does not take any of his medications as prescribed.  He takes his medicine only sporadically when he feels particularly unwell.  Today, he reports that he does have symptoms of polyuria, polyphagia, polydipsia and mild abdominal discomfort without any nausea or vomiting.  His A1c is mildly decreased to 13 today from 14 about 3 months ago. Assessment: His noncompliance with medication seems to be primarily due  to incredible apathy/anxiety/depression.  I have a strong suspicion that there is an underlying psychiatric component that is not being appropriately addressed.  Following a discussion today, he agreed to start taking his Metformin 1000 mg twice daily regularly.  He would like additional help with his daily injections of Lantus until he becomes more  comfortable with these injections.  I do not think that Mr. Lenzen requires hospitalization at this time although he is experiencing symptoms of elevated blood sugar.  I will closely monitor him in the outpatient setting.  I think that he will benefit most from outpatient interventions to address mood disorders which are dramatically affecting his ability to treat his diabetes. -Begin taking Metformin 1000 mg twice daily regularly -Begin taking your Lantus 20 mg daily at home if possible.  I will discuss the possibility of coming into clinic for 1-2 weeks for help with injections.  I was able to discuss this with Dr. Pollie Meyer and plan to discuss it with her pharmacy team as well on 12/10. -Linagliptin not refilled due to inability to afford this medicine.  I do not think his inability to take linagliptin is significant contributing to his current situation.  It would be my preference to have him compliant on the above 2 noted medicines. -Follow-up BMP -Return to clinic in 1 week  Anxiety/depression I am suspicious there may be an additional underlying psychiatric element.  He is currently receiving [facility]health [facility] through [facility].  He has had a total of 4 sessions.  He thinks that this has been helpful to a small degree.  He was amenable to getting connected with a psychiatrist for formal evaluation and possible additional therapy.  In addition to this referral, I had a lengthy discussion with our clinical social worker, Sammuel Hines, who has been closely involved in Mr. Donnie Coffin since case in addition to her coworker [name].  Our social work team has provided many resources to Mr. Bellucci but it has been difficult for him to follow through with any social work recommendations.  Our social work team will continue to be involved in his care to the extent that they are able. -Placed ambulatory referral to psychiatry

## 2020-08-07 NOTE — Assessment & Plan Note (Signed)
Patient's total PHQ-9 score is 24 with 0 on #9. He reports feeling down after many life stressors. Patient acknowledges intent to restart prozac 40mg  daily; prescription sent in. Also amenable to begin seeing a psychiatrist for further management; referral sent to [facility].

## 2020-08-07 NOTE — Chronic Care Management (AMB) (Signed)
   Social Work  Care Management Consultation  08/07/2020 Name: Jahdiel Krol MRN: [id number] DOB: [birth year] Barak Bialecki is a 49 y.o. year old male who sees [name], MD for primary care. LCSW was consulted by PCP for information /resources to assistance patient with  Care Coordination needs.  Assessment:  Patient currently has counseling with [facility] but continues to have difficulty with stressors, getting medication and managing his diabetes.  Recommendation: After consultation with provider it is determined that patient may benefit from referral to pscy and continue follow up from [facility] RN. Intervention: Patient was not interviewed or contacted during this encounter.  LCSW collaborated with PCP .  Conducted brief assessment and provided recommendations. Relevant information and resources discussed with provider. Provider will give information and review recommendations with  patient.   Plan:   CCM team will f.u with patient  LCSW will check to make sure patient connects with psych referral placed by PCP  Review of patient status, including review of consultants reports, relevant laboratory and other test results, and collaboration with appropriate care team members and the patient's provider was performed as part of comprehensive patient evaluation and provision of chronic care management services.      Sammuel Hines, LCSW Care Management & Coordination  [facility]   [phone] 4:13 PM

## 2020-08-07 NOTE — Assessment & Plan Note (Addendum)
Patient's Hgb A1c today is 13.0, down from 14.0 in September 2021. Marked glucosuria and small amount of ketones present on urinalysis. He reports taking metformin sporadically and not lantus or linagliptin at all. Counseled on importance of taking diabetes medications as prescribed and how high blood sugar is likely causing his fatigue, abdominal pain and nausea, polyuria, polydipsia, and xerostomia. Patient acknowledged intent to take metformin as prescribed. Will follow up with patient if administering lantus at the clinic for 2 weeks will be possible. Filled rosuvastatin per recommendation for people over 40 with diabetes. BMP ordered to assess electrolytes and fluid balance. Follow up in 1 week to discuss progress.

## 2020-08-07 NOTE — Patient Instructions (Addendum)
Diabetes: I am sorry that you have been feeling particularly down and that has been difficult for you to take your medicine.  I am glad that we had time for good discussion today.  Please start taking your Metformin every day.  Remember that 2 pills in the morning and 2 pills in the evening.    Please also start taking your Lantus every day.  Your current dose 20 units daily.  I am going to call you later today about whether or not we can have you come into the clinic daily for a week or 2 for injections.  Anxiety/depression: I have put in referral to psychiatry.  You should get a call in the next 1-2 weeks to set up an appointment at the [facility] behavioral [facility] center.  I think this could be incredibly helpful.  We will get some blood work today to make sure your kidney function is appropriate.  Please come back to clinic in 1 week.

## 2020-08-08 ENCOUNTER — Telehealth: Payer: Self-pay | Admitting: *Deleted

## 2020-08-08 LAB — BASIC METABOLIC PANEL
BUN/Creatinine Ratio: 23 — ABNORMAL HIGH (ref 9–20)
BUN: 21 mg/dL (ref 6–24)
CO2: 24 mmol/L (ref 20–29)
Calcium: 10.9 mg/dL — ABNORMAL HIGH (ref 8.7–10.2)
Chloride: 94 mmol/L — ABNORMAL LOW (ref 96–106)
Creatinine, Ser: 0.9 mg/dL (ref 0.76–1.27)
GFR calc Af Amer: 117 mL/min/{1.73_m2} (ref >59)
GFR calc non Af Amer: 101 mL/min/{1.73_m2} (ref >59)
Glucose: 334 mg/dL — ABNORMAL HIGH (ref 65–99)
Potassium: 4.7 mmol/L (ref 3.5–5.2)
Sodium: 134 mmol/L (ref 134–144)

## 2020-08-08 NOTE — Chronic Care Management (AMB) (Signed)
  Care Management   Note  08/08/2020 Name: Adelfo Diebel MRN: [id number] DOB: 1971-04-24  Arliss Frisina is a 49 y.o. year old male who is a primary care patient of [name], MD and is actively engaged with the care management team. I reached out to [name] by phone today to assist with scheduling a follow up visit with the RN Case Manager.  Follow up plan: Telephone appointment with care management team member scheduled for:08/12/2020  [facility] Guide, Embedded Care Coordination [facility] Management

## 2020-08-12 ENCOUNTER — Ambulatory Visit: Payer: Self-pay

## 2020-08-13 ENCOUNTER — Telehealth: Payer: Self-pay | Admitting: Family Medicine

## 2020-08-13 ENCOUNTER — Ambulatory Visit: Payer: Self-pay | Admitting: Family Medicine

## 2020-08-13 NOTE — Patient Instructions (Signed)
Visit Information     .  Monitor and Manage My Blood Sugar-Diabetes Type 2        Timeframe:  Long-Range Goal Priority:  High Start Date:      02/20/20                       Expected End Date:  10/22/20                       - check blood sugar at prescribed times- and take medications as prescribed - take the blood sugar log to all doctor visits    Why is this important?    Checking your blood sugar at home helps to keep it from getting very high or very low.   Writing the results in a diary or log helps the doctor know how to care for you.   Your blood sugar log should have the time, date and the results.   Also, write down the amount of insulin or other medicine that you take.   Other information, like what you ate, exercise done and how you were feeling, will also be helpful.        Patient Care Plan: RN Case Manager  Problem Identified: Glycemic Management (Diabetes, Type 2)   Long-Range Goal: Glycemic Management   Start Date: 02/20/2020  Expected End Date: 10/22/2020  This Visit's Progress: Not on track  Priority: High  Note:   Objective:  Lab Results  Component Value Date   HGBA1C 13.0 (A) 08/07/2020 .   Lab Results  Component Value Date   CREATININE 0.90 08/07/2020   CREATININE 0.88 05/13/2020   CREATININE 1.11 03/24/2020   Current Barriers:  [name] Knowledge Deficits related to basic Diabetes pathophysiology and self care/management . Financial Constraints  Case Manager Clinical Goal(s):  Over the next 90 days, patient will demonstrate improved adherence to prescribed treatment plan for diabetes self care/management as evidenced by: lowering his a1c by 1-2 points . daily monitoring and recording of CBG  . adherence to prescribed medication regimen  Interventions:  . Provided education to patient about basic DM disease process . Reviewed medications with patient and discussed importance of medication adherence- patient states that he has been taking regularly  since the 9th . Discussed plans with patient for ongoing care management follow up and provided patient with direct contact information for care management team . Reviewed scheduled/upcoming provider appointments including: Dr Homero Fellers 08/13/20 . Advised patient, providing education and rationale, to check cbg at least once daily since he has not been checking at all and record, calling the office  for findings outside established parameters.   . Review of patient status, including review of consultants reports, relevant laboratory and other test results, and medications completed. [name] Spoke with the patient and he states that he is still having a hard time.  He is not understanding why his body is feeling bad taking the medications.  I explained that his body is use to functioning with his blood sugars at a high level andnow that them edication are lower his blood sugars his body will need to adjust.  I advised him to talk to his PCP. [name] Patient stated that he did receive the [facility] [employer] papers and he would work on filling those out. [name] activity based on tolerance and functional limitations encouraged . healthy lifestyle promoted . reduction of sedentary activity encouraged . blood glucose monitoring encouraged . blood glucose readings reviewed-213 .  mutual A1C goal set or reviewed  Patient Goals/Self Care Activities:   Patient verbalizes understanding of plan Self-administers medications as prescribed  Calls pharmacy for medication refills  Call's provider office for new concerns or questions  check blood sugar at prescribed times  check blood sugar if I feel it is too high or too low  take the blood sugar meter to all doctor visits  Keep all his appointments as scheduled  Follow up Plan: The care management team will reach out to the patient again over the next 10 days.        The patient verbalized understanding of instructions, educational materials, and care plan provided today  and declined offer to receive copy of patient instructions, educational materials, and care plan.   [name] RN, BSN, [facility] Care Management Coordinator [facility] Phone: [phone] I Fax: [phone]

## 2020-08-13 NOTE — Telephone Encounter (Signed)
Patient came in late advised him we could reschedule.  He would like Dr Homero Fellers to call him regarding his diabetes

## 2020-08-13 NOTE — Chronic Care Management (AMB) (Signed)
RN  Care Management   Follow Up Note   08/13/2020 Name: Matthew Lynch MRN: [id number] DOB: 1971/01/09  Reason for referral : Chronic Care Management (DM II)   Matthew Lynch is a 49 y.o. year old male who is a primary care patient of [name], MD. The care management team was consulted for assistance with care management and care coordination needs.      Assessment: following up with the patient regarding his DM II.  He states that he feel weak and tired and only feels that way when he takes his medication.    Patient Care Plan: RN Case Manager  Problem Identified: Glycemic Management (Diabetes, Type 2)   Long-Range Goal: Glycemic Management   Start Date: 02/20/2020  Expected End Date: 10/22/2020  This Visit's Progress: Not on track  Priority: High  Note:   Objective:  Lab Results  Component Value Date   HGBA1C 13.0 (A) 08/07/2020 .   Lab Results  Component Value Date   CREATININE 0.90 08/07/2020   CREATININE 0.88 05/13/2020   CREATININE 1.11 03/24/2020   Current Barriers:  [name] Knowledge Deficits related to basic Diabetes pathophysiology and self care/management . Financial Constraints  Case Manager Clinical Goal(s):  Over the next 90 days, patient will demonstrate improved adherence to prescribed treatment plan for diabetes self care/management as evidenced by: lowering his a1c by 1-2 points . daily monitoring and recording of CBG  . adherence to prescribed medication regimen  Interventions:  . Provided education to patient about basic DM disease process . Reviewed medications with patient and discussed importance of medication adherence- patient states that he has been taking regularly since the 9th . Discussed plans with patient for ongoing care management follow up and provided patient with direct contact information for care management team . Reviewed scheduled/upcoming provider appointments including: Dr Homero Fellers 08/13/20 . Advised patient, providing education  and rationale, to check cbg at least once daily since he has not been checking at all and record, calling the office  for findings outside established parameters.   . Review of patient status, including review of consultants reports, relevant laboratory and other test results, and medications completed. [name] Spoke with the patient and he states that he is still having a hard time.  He is not understanding why his body is feeling bad taking the medications.  I explained that his body is use to functioning with his blood sugars at a high level andnow that them edication are lower his blood sugars his body will need to adjust.  I advised him to talk to his PCP. [name] Patient stated that he did receive the [facility] [employer] papers and he would work on filling those out. [name] activity based on tolerance and functional limitations encouraged . healthy lifestyle promoted . reduction of sedentary activity encouraged . blood glucose monitoring encouraged . blood glucose readings reviewed-213 . mutual A1C goal set or reviewed  Patient Goals/Self Care Activities:   Patient verbalizes understanding of plan Self-administers medications as prescribed  Calls pharmacy for medication refills  Call's provider office for new concerns or questions  check blood sugar at prescribed times  check blood sugar if I feel it is too high or too low  take the blood sugar meter to all doctor visits  Keep all his appointments as scheduled  Follow up Plan: The care management team will reach out to the patient again over the next 10 days.        Review of patient status, including  review of consultants reports, relevant laboratory and other test results, and collaboration with appropriate care team members and the patient's provider was performed as part of comprehensive patient evaluation and provision of chronic care management services.    SDOH (Social Determinants of Health) assessments performed: No See Care Plan  activities for detailed interventions related to SDOH)      [name] RN, BSN, [facility] Care Management Coordinator [facility] Phone: [phone] Fax: [phone]

## 2020-08-15 NOTE — Telephone Encounter (Signed)
Spoke with pt. He stated that he didn't need anything. He knows he missed his last appt. But has one set for 09/09/20. He just wanted to let Dr. Homero Fellers know that if he needed anything from him before then to just call. Aquilla Solian, CMA

## 2020-08-19 ENCOUNTER — Ambulatory Visit: Payer: Self-pay

## 2020-08-19 NOTE — Chronic Care Management (AMB) (Signed)
Care Management   RN Case Manager Follow UpNote  08/19/2020 Name: Matthew Lynch MRN: [id number] DOB: 10-19-1970 Matthew Lynch is a 49 y.o. year old male who sees Matthew Haymaker, MD for primary care.  Patient is enrolled in a Managed Medicaid plan: No.  The Care Management team was consulted by PCP to assist the patient with . Disease Management and Educational Needs DM II.   RNCM engaged with Matthew Lynch today by telephone  in response to provider referral for RN case management and/or care coordination services. See care plan below for details during this encounter.  Follow up Plan: The care management team will reach out to the patient again over the next 10 days.    Advanced Directives Status:Not addressed in this encounter.     SDOH (Social Determinants of Health) assessments performed: No     Patient Care Plan: RN Case Manager  Problem Identified: Glycemic Management (Diabetes, Type 2)   Long-Range Goal: Glycemic Management   Start Date: 02/20/2020  Expected End Date: 10/22/2020  Recent Progress: Not on track  Priority: High  Note:   Objective:  Lab Results  Component Value Date   HGBA1C 13.0 (A) 08/07/2020 .   Lab Results  Component Value Date   CREATININE 0.90 08/07/2020   CREATININE 0.88 05/13/2020   CREATININE 1.11 03/24/2020   Current Barriers:  Matthew Kitchen Knowledge Deficits related to basic Diabetes pathophysiology and self care/management . Financial Constraints  Case Manager Clinical Goal(s):  Over the next 90 days, patient will demonstrate improved adherence to prescribed treatment plan for diabetes self care/management as evidenced by: lowering his a1c by 1-2 points . daily monitoring and recording of CBG  . adherence to prescribed medication regimen  Interventions:  . Provided education to patient about basic DM disease process . Reviewed medications with patient and discussed importance of medication adherence- patient states that he is still taking his  medications . Discussed plans with patient for ongoing care management follow up and provided patient with direct contact information for care management team . Reviewed scheduled/upcoming provider appointments including: Matthew Lynch 09/10/19 at 350 pm.  The patient was reminded of the date and time. . Advised patient, providing education and rationale, to check cbg at least once daily since he has not been checking at all and record, calling the office  for findings outside established parameters.   . Review of patient status, including review of consultants reports, relevant laboratory and other test results, and medications completed. Matthew Kitchen Spoke with the patient and he states that he is still having the same symptoms.  He states that only food that he is able to hold down are tacos. . Patient stated that he still has not sent the  [facility] [employer] papers in he needs to get one more paper to send in with the rest of the paperwork. Matthew Kitchen activity based on tolerance and functional limitations encouraged . healthy lifestyle promoted . reduction of sedentary activity encouraged . blood glucose monitoring encouraged . blood glucose readings reviewed-patient would not give a definitive number just states that his blood sugars have been in the 200's. . mutual A1C goal set or reviewed  Patient Goals/Self Care Activities:   Patient verbalizes understanding of plan Self-administers medications as prescribed  Calls pharmacy for medication refills  Call's provider office for new concerns or questions  check blood sugar at prescribed times  check blood sugar if I feel it is too high or too low  take the blood sugar meter to all  doctor visits  Keep all his appointments as scheduled       Outpatient Encounter Medications as of 08/19/2020  Medication Sig  . Blood Glucose Monitoring Suppl (ONETOUCH VERIO) w/Device KIT 1 each by Does not apply route daily.  Matthew Kitchen FLUoxetine (PROZAC) 40 MG capsule Take 1 capsule  (40 mg total) by mouth daily.  Matthew Kitchen glucose blood (ONETOUCH VERIO) test strip Check blood sugar 2-3 times daily  . insulin glargine (LANTUS) 100 UNIT/ML Solostar Pen Inject 20 Units into the skin daily.  . Insulin Pen Needle 29G X 12.7MM MISC 1 Stick by Does not apply route daily.  Matthew Kitchen linagliptin (TRADJENTA) 5 MG TABS tablet Take 1 tablet (5 mg total) by mouth daily.  . metFORMIN (GLUCOPHAGE-XR) 500 MG 24 hr tablet Take 2 tablets (1,000 mg total) by mouth 2 (two) times daily with a meal.  . ONETOUCH DELICA LANCETS 98M MISC Check blood sugar 2-3 times daily  . rosuvastatin (CRESTOR) 20 MG tablet Take 1 tablet (20 mg total) by mouth daily.   No facility-administered encounter medications on file as of 08/19/2020.    Review of patient status, including review of consultants reports, relevant laboratory and other test results, and collaboration with appropriate care team members and the patient's provider was performed as part of comprehensive patient evaluation and provision of chronic care management services.       Information about Care Management services was shared with Mr.  Lynch today including:  1. Care Management services include personalized support from designated clinical staff supervised by his physician, including individualized plan of care and coordination with other care providers 2. Remind patient of 24/7 contact phone numbers to provider's office for assistance with urgent and routine care needs. 3. Care Management services are voluntary and patient may stop at any time .   Patient agreed to services provided today and verbal consent obtained.

## 2020-08-19 NOTE — Patient Instructions (Signed)
Matthew Lynch  it was nice speaking with you. Please call me directly [phone] if you have questions about the goals we discussed.  Goals Addressed            This Visit's Progress   . Monitor and Manage My Blood Sugar-Diabetes Type 2       Timeframe:  Long-Range Goal Priority:  High Start Date:      02/20/20                       Expected End Date:  10/22/20                       - check blood sugar at prescribed times- and take medications as prescribed - take the blood sugar log to all doctor visits -attend all follow up visit with PCP   Why is this important?    Checking your blood sugar at home helps to keep it from getting very high or very low.   Writing the results in a diary or log helps the doctor know how to care for you.   Your blood sugar log should have the time, date and the results.   Also, write down the amount of insulin or other medicine that you take.   Other information, like what you ate, exercise done and how you were feeling, will also be helpful.         Patient Care Plan: RN Case Manager  Problem Identified: Glycemic Management (Diabetes, Type 2)   Long-Range Goal: Glycemic Management   Start Date: 02/20/2020  Expected End Date: 10/22/2020  Recent Progress: Not on track  Priority: High  Note:   Objective:  Lab Results  Component Value Date   HGBA1C 13.0 (A) 08/07/2020 .   Lab Results  Component Value Date   CREATININE 0.90 08/07/2020   CREATININE 0.88 05/13/2020   CREATININE 1.11 03/24/2020   Current Barriers:  [name] Knowledge Deficits related to basic Diabetes pathophysiology and self care/management . Financial Constraints  Case Manager Clinical Goal(s):  Over the next 90 days, patient will demonstrate improved adherence to prescribed treatment plan for diabetes self care/management as evidenced by: lowering his a1c by 1-2 points . daily monitoring and recording of CBG  . adherence to prescribed medication regimen  Interventions:   . Provided education to patient about basic DM disease process . Reviewed medications with patient and discussed importance of medication adherence- patient states that he is still taking his medications . Discussed plans with patient for ongoing care management follow up and provided patient with direct contact information for care management team . Reviewed scheduled/upcoming provider appointments including: Dr Homero Fellers 09/10/19 at 350 pm.  The patient was reminded of the date and time. . Advised patient, providing education and rationale, to check cbg at least once daily since he has not been checking at all and record, calling the office  for findings outside established parameters.   . Review of patient status, including review of consultants reports, relevant laboratory and other test results, and medications completed. [name] Spoke with the patient and he states that he is still having the same symptoms.  He states that only food that he is able to hold down are tacos. . Patient stated that he still has not sent the  [facility] [employer] papers in he needs to get one more paper to send in with the rest of the paperwork. [name] activity based on tolerance and functional limitations  encouraged . healthy lifestyle promoted . reduction of sedentary activity encouraged . blood glucose monitoring encouraged . blood glucose readings reviewed-patient would not give a definitive number just states that his blood sugars have been in the 200's. . mutual A1C goal set or reviewed  Patient Goals/Self Care Activities:   Patient verbalizes understanding of plan Self-administers medications as prescribed  Calls pharmacy for medication refills  Call's provider office for new concerns or questions  check blood sugar at prescribed times  check blood sugar if I feel it is too high or too low  take the blood sugar meter to all doctor visits  Keep all his appointments as scheduled  Follow up Plan: The care management  team will reach out to the patient again over the next 10 days.        [name] received Care Management services today:  1. Care Management services include personalized support from designated clinical staff supervised by his physician, including individualized plan of care and coordination with other care providers 2. 24/7 contact [phone] for assistance for urgent and routine care needs. 3. Care Management are voluntary services and be declined at any time by calling the office.  The patient verbalized understanding of instructions provided today and declined a print copy of patient instruction materials.     Matthew Fairly, RN

## 2020-09-02 ENCOUNTER — Telehealth: Payer: Self-pay | Admitting: Licensed Clinical Social Worker

## 2020-09-02 ENCOUNTER — Telehealth: Payer: Self-pay

## 2020-09-02 NOTE — Chronic Care Management (AMB) (Signed)
    Clinical Social Work  Care Management  Unsuccessful Phone Outreach    09/02/2020 Name: Matthew Lynch MRN: [id number] DOB: 1971/04/15  [name] is a 50 y.o. year old male who is a primary care patient of [name], MD . Patient is enrolled in a Managed Medicaid Health Plan: No  F/U phone call to [name] today to assess needs, and progress with connecting to psychiatry at [facility].  A HIPPA compliant phone message was left for the patient providing contact information and requesting a return call.   Intervention: LCSW previously called [facility] to verify that the referral was received.   Plan: Left voice message for patient, will wait for return call.  Will continue to collaborate with PCP and CCM RN for ongoing needs   Review of patient status, including review of consultants reports, relevant laboratory and other test results, and collaboration with appropriate care team members and the patient's provider was performed as part of comprehensive patient evaluation and provision of care management services.    Sammuel Hines, LCSW Care Management & Coordination  [facility]   [phone] 2:51 PM

## 2020-09-02 NOTE — Telephone Encounter (Signed)
  Care Management   Outreach Note  09/02/2020 Name: Matthew Lynch MRN: [id number] DOB: [birth year]  Referred by: [name], MD Reason for referral : Chronic Care Management (DM II)   Trell Secrist is enrolled in a Managed Medicaid Health Plan: No  An unsuccessful telephone outreach was attempted today. The patient was referred to the case management team for assistance with care management and care coordination.   Follow Up Plan: A HIPAA compliant phone message was left for the patient providing contact information and requesting a return call.  The care management team will reach out to the patient again over the next 7-14 days.   [name] RN, BSN, [facility] Care Management Coordinator [facility] Phone: [phone] I Fax: [phone]

## 2020-09-03 ENCOUNTER — Telehealth: Payer: Self-pay | Admitting: *Deleted

## 2020-09-03 NOTE — Chronic Care Management (AMB) (Signed)
  Care Management   Note  09/03/2020 Name: Matthew Lynch MRN: [id number] DOB: Jan 11, 1971  Matthew Lynch is a 50 y.o. year old male who is a primary care patient of [name], MD and is actively engaged with the care management team. I reached out to [name] by phone today to assist with re-scheduling a follow up visit with the RN Case Manager.  Follow up plan: Unsuccessful telephone outreach attempt made. A HIPAA compliant phone message was left for the patient providing contact information and requesting a return call. The care management team will reach out to the patient again over the next 7 days. If patient returns call to provider office, please advise to call Embedded Care Management Care Guide [name] at [phone].  [name]  Care Guide, Embedded Care Coordination [facility] Management

## 2020-09-09 ENCOUNTER — Ambulatory Visit: Payer: Self-pay | Admitting: Family Medicine

## 2020-09-09 ENCOUNTER — Ambulatory Visit: Payer: Self-pay | Admitting: Licensed Clinical Social Worker

## 2020-09-09 NOTE — Chronic Care Management (AMB) (Signed)
  Care Management   Note  09/09/2020 Name: Etai Copado MRN: [id number] DOB: September 29, 1970  Yuvraj Pfeifer is a 50 y.o. year old male who is a primary care patient of [name], MD and is actively engaged with the care management team. I reached out to [name] by phone today to assist with re-scheduling a follow up visit with the RN Case Manager  Follow up plan: Unsuccessful telephone outreach attempt made. A HIPAA compliant phone message was left for the patient providing contact information and requesting a return call. The care management team will reach out to the patient again over the next 7 days. If patient returns call to provider office, please advise to call Embedded Care Management Care Guide [name] at [phone].  [name]  Care Guide, Embedded Care Coordination [facility] Management

## 2020-09-09 NOTE — Chronic Care Management (AMB) (Signed)
  Chronic Care Management    Clinical Social Work Note  09/09/2020 Name: Matthew Lynch MRN: [id number] DOB: 1971-04-01  Matthew Lynch is a 50 y.o. year old male who is a primary care patient of [name], MD. The CCM team was consulted to assist the patient with chronic disease management and/or care coordination needs related to: Mental Health Counseling and Resources.   F/U on referral for behavioral health needs in response to provider referral for social work chronic care management and care coordination services.  Recommendation: After review of chart it is determined that patient may benefit from Calling [facility] to schedule his appointment   Assessment/Interventions: Patient was not interviewed or contacted during this encounter.  LCSW collaborated with PCP and [facility] for appointment.  PCP has  Review of patient past medical history, allergies, medications, and health status, including review of relevant consultants reports was performed today as part of a comprehensive evaluation and provision of chronic care management and care coordination services.      Follow up:  No follow up scheduled with LCSW.  LCSW will continue to collaborate with PCP and CCM RN as needed.   Sammuel Hines, LCSW Care Management & Coordination  [facility]   [phone] 8:44 AM

## 2020-09-16 NOTE — Chronic Care Management (AMB) (Signed)
  Care Management   Note  09/16/2020 Name: Demarquez Ciolek MRN: [id number] DOB: 19-May-1971  Thurlow Gallaga is a 50 y.o. year old male who is a primary care patient of [name], MD and is actively engaged with the care management team. I reached out to [name] by phone today to assist with re-scheduling a follow up visit with the RN Case Manager  Follow up plan: Unsuccessful telephone outreach attempt made. A HIPAA compliant phone message was left for the patient providing contact information and requesting a return call. Unable to make contact on outreach attempts x 3. PCP [name], MD notified via routed documentation in medical record. Appropriate care team members and provider have been notified via electronic communication. We have been unable to make contact with the patient for follow up. The care management team is available to follow up with the patient after provider conversation with the patient regarding recommendation for care management engagement and subsequent re-referral to the care management team. The patient has been provided with contact information for the care management team and has been advised to call with any health related questions or concerns.  If patient returns call to provider office, please advise to call Embedded Care Management Care Guide [name] at [phone].  [name]  Care Guide, Embedded Care Coordination [facility] Management

## 2020-12-10 ENCOUNTER — Encounter: Payer: Self-pay | Admitting: Family Medicine

## 2020-12-10 ENCOUNTER — Other Ambulatory Visit: Payer: Self-pay

## 2020-12-10 ENCOUNTER — Ambulatory Visit (INDEPENDENT_AMBULATORY_CARE_PROVIDER_SITE_OTHER): Payer: Self-pay | Admitting: Family Medicine

## 2020-12-10 VITALS — BP 101/71 | HR 89 | Ht 65.0 in | Wt 100.4 lb

## 2020-12-10 DIAGNOSIS — E1165 Type 2 diabetes mellitus with hyperglycemia: Secondary | ICD-10-CM

## 2020-12-10 DIAGNOSIS — Z794 Long term (current) use of insulin: Secondary | ICD-10-CM

## 2020-12-10 DIAGNOSIS — F419 Anxiety disorder, unspecified: Secondary | ICD-10-CM

## 2020-12-10 DIAGNOSIS — F32A Depression, unspecified: Secondary | ICD-10-CM

## 2020-12-10 LAB — POCT GLYCOSYLATED HEMOGLOBIN (HGB A1C): HbA1c, POC (controlled diabetic range): 13.8 % — AB (ref 0.0–7.0)

## 2020-12-10 LAB — GLUCOSE, POCT (MANUAL RESULT ENTRY): POC Glucose: 371 mg/dl — AB (ref 70–99)

## 2020-12-10 LAB — POCT UA - MICROALBUMIN
Albumin/Creatinine Ratio, Urine, POC: <30
Creatinine, POC: 100 mg/dL
Microalbumin Ur, POC: 30 mg/L

## 2020-12-10 MED ORDER — FLUOXETINE HCL 40 MG PO CAPS: ORAL_CAPSULE | Freq: Every day | ORAL | 3 refills | Status: DC

## 2020-12-10 MED ORDER — INSULIN DEGLUDEC 100 UNIT/ML ~~LOC~~ SOPN: 20.0000 [IU] | PEN_INJECTOR | Freq: Every day | SUBCUTANEOUS | 0 refills | Status: DC

## 2020-12-10 NOTE — Progress Notes (Signed)
SUBJECTIVE:   CHIEF COMPLAINT / HPI:   Diabetes He reports that he was feeling much better when He was in jail and being administered insulin 3 times per day.  Since he got out of jail, he as not been taking any medication.  He now feels worse than when he was in jail.  He reports frequent urination and thirst.  He has mild stomach pain and nausea.  Depression/anxiety He scored 23 on his PHQ-9 today with a response of 0 for question 9.  He reports that he does feel significant symptoms of depression and anxiety which is likely contributing to difficulty taking his medication.  He has previously received [facility] since her last visit, he has completed his therapy and was told that he would not have further benefit from additional therapy sessions.  Is also previously been referred to psychiatry but hung up when he was called to schedule an appointment and has never been seen by psychiatry.  PERTINENT  PMH / PSH:   OBJECTIVE:   BP 101/71   Pulse 89   Ht 5\' 5"  (1.651 m)   Wt 100 lb 6 oz (45.5 kg)   SpO2 99%   BMI 16.70 kg/m    General: Alert and cooperative and appears to be in no acute distress HEENT: Mildly dry mucous membranes. Cardio: Normal S1 and S2, no S3 or S4. Rhythm is regular. No murmurs or rubs.   Pulm: Clear to auscultation bilaterally, no crackles, wheezing, or diminished breath sounds. Normal respiratory effort Abdomen: Bowel sounds normal.  Mild abdominal tenderness in lower quadrants bilaterally. Extremities: No peripheral edema. Warm/ well perfused.  Strong radial pulses.  No skin tenting.  Good cap refill. Neuro: Cranial nerves grossly intact  ASSESSMENT/PLAN:   Type 2 diabetes mellitus with hyperglycemia [facility]) Mr. Bertino is well-known to me and his diabetes has been difficult to control due to difficulty maintaining his medication regimen at home.  He notes that he felt much better when he was in jail because his medication  ministration was overseen by the medical staff.  He has not been on any medication for the past 2 weeks and now feels quite ill.  He has an A1c today of 13.8 and a capillary blood glucose of 371.  I am not concerned for DKA or HHS currently but he is certainly symptomatic from his hyperglycemia.  We discussed the importance of medication adherence.  We discussed that we simply do not have the resources to provide someone who will care for him full-time and administer all of his medication.  He is aware of this situation.  Due to difficulty affording medication, he was provided a sample of degludec in place of his Lantus.  He was told to check his blood sugar every morning and to take 20 units of degludec.  He was also encouraged to begin taking his Metformin again even though it did not immediately make him feel better.  I will have him follow-up with pharmacy in 1 week to see how he was doing with medication administration.  I suspect underlying psychiatric illnesses dramatically interfering with her treatment of his diabetes at this time.  -Follow-up with pharmacy in 1 week -Start degludec 20 mg daily -Restart Metformin 1000 mg twice daily -Follow-up in clinic with me in 1-2 months, or sooner if pharmacy thinks that there be a benefit.  Anxiety and depression He has previously been prescribed Prozac although he has not been taking that.  He is open to starting medication today that could be helpful for his depression and anxiety.  No SI.  I think that is poorly controlled mood disorder is dramatically influencing his ability to maintain adherence to his diabetic regimen.  -Start Prozac 40 mg daily     [name], MD [facility]

## 2020-12-10 NOTE — Assessment & Plan Note (Signed)
He has previously been prescribed Prozac although he has not been taking that.  He is open to starting medication today that could be helpful for his depression and anxiety.  No SI.  I think that is poorly controlled mood disorder is dramatically influencing his ability to maintain adherence to his diabetic regimen.  -Start Prozac 40 mg daily

## 2020-12-10 NOTE — Addendum Note (Signed)
Addended by: Nada Libman on: 12/10/2020 01:20 PM   Modules accepted: Orders

## 2020-12-10 NOTE — Assessment & Plan Note (Signed)
>>  ASSESSMENT AND PLAN FOR TYPE 2 DIABETES MELLITUS WITH HYPERGLYCEMIA (HCC) WRITTEN ON 12/10/2020  1:08 PM BY [name], MD  Matthew Lynch is well-known to me and his diabetes has been difficult to control due to difficulty maintaining his medication regimen at home.  He notes that he felt much better when he was in jail because his medication ministration was overseen by the medical staff.  He has not been on any medication for the past 2 weeks and now feels quite ill.  He has an A1c today of 13.8 and a capillary blood glucose of 371.  I am not concerned for DKA or HHS currently but he is certainly symptomatic from his hyperglycemia.  We discussed the importance of medication adherence.  We discussed that we simply do not have the resources to provide someone who will care for him full-time and administer all of his medication.  He is aware of this situation.  Due to difficulty affording medication, he was provided a sample of degludec in place of his Lantus.  He was told to check his blood sugar every morning and to take 20 units of degludec.  He was also encouraged to begin taking his Metformin again even though it did not immediately make him feel better.  I will have him follow-up with pharmacy in 1 week to see how he was doing with medication administration.  I suspect underlying psychiatric illnesses dramatically interfering with her treatment of his diabetes at this time.  -Follow-up with pharmacy in 1 week -Start degludec 20 mg daily -Restart Metformin 1000 mg twice daily -Follow-up in clinic with me in 1-2 months, or sooner if pharmacy thinks that there be a benefit.

## 2020-12-10 NOTE — Assessment & Plan Note (Signed)
Mr. Maese is well-known to me and his diabetes has been difficult to control due to difficulty maintaining his medication regimen at home.  He notes that he felt much better when he was in jail because his medication ministration was overseen by the medical staff.  He has not been on any medication for the past 2 weeks and now feels quite ill.  He has an A1c today of 13.8 and a capillary blood glucose of 371.  I am not concerned for DKA or HHS currently but he is certainly symptomatic from his hyperglycemia.  We discussed the importance of medication adherence.  We discussed that we simply do not have the resources to provide someone who will care for him full-time and administer all of his medication.  He is aware of this situation.  Due to difficulty affording medication, he was provided a sample of degludec in place of his Lantus.  He was told to check his blood sugar every morning and to take 20 units of degludec.  He was also encouraged to begin taking his Metformin again even though it did not immediately make him feel better.  I will have him follow-up with pharmacy in 1 week to see how he was doing with medication administration.  I suspect underlying psychiatric illnesses dramatically interfering with her treatment of his diabetes at this time.  -Follow-up with pharmacy in 1 week -Start degludec 20 mg daily -Restart Metformin 1000 mg twice daily -Follow-up in clinic with me in 1-2 months, or sooner if pharmacy thinks that there be a benefit.

## 2020-12-10 NOTE — Patient Instructions (Signed)
Diabetes: I am glad to hear that you are having good blood sugar control while you are in jail.  Sadly, will not be possible to have someone administer medication to 3 times a day outside of jail.  I have provided insulin for you today in clinic.  I recommend that you take 20 units every morning.  I also recommend you check your blood sugar daily before giving yourself insulin.  Please also continue to take Metformin even though it does not immediately make you feel better.  I have also sent a message to our pharmacist about getting you scheduled for an appointment in clinic next week for someone to keep an eye on your diabetes.  Depression/anxiety: I think it could be really helpful for you to start taking Prozac.  I have sent this medication to your pharmacy.  It can take as long as 8 weeks to take full effect.  Mom like to talk to you about this again in about a month when I see you again.

## 2020-12-11 ENCOUNTER — Encounter: Payer: Self-pay | Admitting: Family Medicine

## 2020-12-11 LAB — LIPID PANEL
Chol/HDL Ratio: 4.2 ratio (ref 0.0–5.0)
Cholesterol, Total: 216 mg/dL — ABNORMAL HIGH (ref 100–199)
HDL: 52 mg/dL (ref >39)
LDL Chol Calc (NIH): 152 mg/dL — ABNORMAL HIGH (ref 0–99)
Triglycerides: 67 mg/dL (ref 0–149)
VLDL Cholesterol Cal: 12 mg/dL (ref 5–40)

## 2020-12-11 LAB — BASIC METABOLIC PANEL
BUN/Creatinine Ratio: 12 (ref 9–20)
BUN: 11 mg/dL (ref 6–24)
CO2: 24 mmol/L (ref 20–29)
Calcium: 9.9 mg/dL (ref 8.7–10.2)
Chloride: 95 mmol/L — ABNORMAL LOW (ref 96–106)
Creatinine, Ser: 0.95 mg/dL (ref 0.76–1.27)
Glucose: 354 mg/dL — ABNORMAL HIGH (ref 65–99)
Potassium: 4.6 mmol/L (ref 3.5–5.2)
Sodium: 134 mmol/L (ref 134–144)
eGFR: 99 mL/min/{1.73_m2} (ref >59)

## 2020-12-17 ENCOUNTER — Other Ambulatory Visit: Payer: Self-pay

## 2020-12-17 ENCOUNTER — Ambulatory Visit (INDEPENDENT_AMBULATORY_CARE_PROVIDER_SITE_OTHER): Payer: Self-pay | Admitting: Pharmacist

## 2020-12-17 ENCOUNTER — Encounter: Payer: Self-pay | Admitting: Pharmacist

## 2020-12-17 ENCOUNTER — Ambulatory Visit: Payer: Self-pay | Admitting: Family Medicine

## 2020-12-17 DIAGNOSIS — F419 Anxiety disorder, unspecified: Secondary | ICD-10-CM

## 2020-12-17 DIAGNOSIS — E1165 Type 2 diabetes mellitus with hyperglycemia: Secondary | ICD-10-CM

## 2020-12-17 DIAGNOSIS — E785 Hyperlipidemia, unspecified: Secondary | ICD-10-CM

## 2020-12-17 DIAGNOSIS — F32A Depression, unspecified: Secondary | ICD-10-CM

## 2020-12-17 DIAGNOSIS — Z794 Long term (current) use of insulin: Secondary | ICD-10-CM

## 2020-12-17 NOTE — Patient Instructions (Signed)
Matthew Lynch it was a pleasure seeing you today.   Please do the following:  1. Increase Tresiba to 24 units daily as directed today during your appointment. If you have any questions or if you believe something has occurred because of this change, call me or your doctor to let one of [place] know.  2. Continue checking blood sugars at home. It's really important that you record these and bring these in to your next doctor's appointment.  3. Continue making the lifestyle changes we've discussed together during our visit. Diet and exercise play a significant role in improving your blood sugars.  4. Follow-up with me via telephone in one week and in-person in 4 weeks.   Hypoglycemia or low blood sugar:   Low blood sugar can happen quickly and may become an emergency if not treated right away.   While this shouldn't happen often, it can be brought upon if you skip a meal or do not eat enough. Also, if your insulin or other diabetes medications are dosed too high, this can cause your blood sugar to go to low.   Warning signs of low blood sugar include: 1. Feeling shaky or dizzy 2. Feeling weak or tired  3. Excessive hunger 4. Feeling anxious or upset  5. Sweating even when you aren't exercising  What to do if I experience low blood sugar? Follow the Rule of 15 1. Check your blood sugar with your meter. If lower than 70, proceed to step 2.  2. Treat with 15 grams of fast acting carbs which is found in 3-4 glucose tablets. If none are available you can try hard candy, 1 tablespoon of sugar or honey,4 ounces of fruit juice, or 6 ounces of REGULAR soda.  3. Re-check your sugar in 15 minutes. If it is still below 70, do what you did in step 2 again. If your blood sugar has come back up, go ahead and eat a snack or small meal made up of complex carbs (ex. Whole grains) and protein at this time to avoid recurrence of low blood sugar.

## 2020-12-17 NOTE — Assessment & Plan Note (Signed)
>>  ASSESSMENT AND PLAN FOR TYPE 2 DIABETES MELLITUS WITH HYPERGLYCEMIA (HCC) WRITTEN ON 12/17/2020  1:21 PM BY KELLEY, RACHELLE, RPH  T2DM is not controlled likely due to challenges with adherence. Medication adherence appears suboptimal. Additional pharmacotherapy is warranted, however, will discuss this further in future. Began process for patient assistance application of empagliflozin (Jardiance) and goal in meantime is to bring blood glucose down before initiation as application can take several weeks to process. Will increase insulin degludec Evaristo Bury) from 20 to 24 units (20% increase). Also encouraged patient to take evening dose of metformin and instructed that if he misses it with dinner that he can take it later in the evening as he tends to snack after dinner. In regards to diet main focus is on reducing sugar intake via drinks. Will have further discussion regarding dietary food choices at follow-up visit. Following instruction patient verbalized understanding of treatment plan.    Increased dose of basal insulin degludec Evaristo Bury) to 24 units once daily. Extensively discussed pathophysiology of diabetes, dietary effects on blood sugar control, and recommended lifestyle interventions,  Patient will adhere to dietary modifications of reducing sugary drinks and switching to lower/no sugar substitures Counseled on s/sx of and management of hypoglycemia Next A1C anticipated 03/11/2021.

## 2020-12-17 NOTE — Progress Notes (Signed)
Subjective:    Patient ID: Matthew Lynch, male    DOB: 21-Aug-1971, 50 y.o.   MRN: [id number]  HPI Patient is a 50 y.o. male who presents for diabetes management. He is in good spirits and presents without assistance alongside 16 year old son. Patient was referred and last seen by Primary Care Provider on 12/10/20.  Patient reports "when I went to jail two weeks ago it was a controlled environment with controlled eating and they were giving me my medicine so I felt good. I felt like I was coming back." His goal is to get back to this feeling.  PMH significant for HLD, depression.   Patient reports diabetes was diagnosed in 2020.   Insurance coverage/medication affordability: Self-pay   Family/Social history: has 10 kids, currently renting a room from friend's mother   Current diabetes medications include: metformin 500 2 tabs BID, insulin degludec Evaristo Bury) 20 units Current hyperlipidemia medications include: none Patient states that He is taking his diabetes medications as prescribed. Patient denies adherence with medications. Patient states that He misses his medications 6-7 times per week, on average. He states he does try to take his medications but on occasion he does not feel like it or is tired. When questioned on whether or not it was a particular medication he was struggling with he states it is his evening dose of metformin.   Does you feel that your medications are working for you?  "somewhat"   Have you been experiencing any side effects to the medications prescribed? Yes; GI discomfort  Do you have any problems obtaining medications due to transportation or finances?  Yes; cost and transportation   Patient reported dietary habits:  Eats 0-2 meals/day and 0-10 snacks/day Breakfast: grits; oatmeal; eggs; cereal (raisin bran, honeycomb) Lunch: salad; tuna fish; chick-fil-a (3x/week) Dinner: burger or fast food, "it's whatever, you never know" Snacks: chips; carrots; cheese;  grapes; peanut butter crackers; cheese crackers; ice cream, candy Drinks: water; gatorade; protein drinks; juice  Patient-reported exercise habits: "run around after my kids and complete housework at my mother's"   Patient denies hypoglycemic events. Patient reports polyuria (increased urination).  Patient reports polyphagia (increased appetite).  Patient reports polydipsia (increased thirst).  Patient reports neuropathy (nerve pain). Patient reports visual changes. Patient reports self foot exams.   "I check it most mornings" Home fasting blood sugars: 300-400's    Objective:   Labs:   Lab Results  Component Value Date   HGBA1C 13.8 (A) 12/10/2020   HGBA1C 13.0 (A) 08/07/2020   HGBA1C 14.0 (A) 05/13/2020   Vitals:   12/17/20 0851  Weight: 107 lb 9.6 oz (48.8 kg)  BMI (Calculated): 17.91    Lab Results  Component Value Date   MICRALBCREAT <30 12/10/2020    Lipid Panel     Component Value Date/Time   CHOL 216 (H) 12/10/2020 0928   TRIG 67 12/10/2020 0928   HDL 52 12/10/2020 0928   CHOLHDL 4.2 12/10/2020 0928   CHOLHDL 5.2 01/25/2020 1308   VLDL 54 (H) 01/25/2020 1308   LDLCALC 152 (H) 12/10/2020 0928    Clinical Atherosclerotic Cardiovascular Disease (ASCVD): No  The 10-year ASCVD risk score Denman George DC Jr., et al., 2013) is: 9.7%   Values used to calculate the score:     Age: 72 years     Sex: Male     Is Non-Hispanic African American: Yes     Diabetic: Yes     Tobacco smoker: Yes     Systolic  Blood Pressure: 101 mmHg     Is BP treated: No     HDL Cholesterol: 52 mg/dL     Total Cholesterol: 216 mg/dL   PHQ-9 Score: 22; score of 0 on #9  Assessment/Plan:  T2DM is not controlled likely due to challenges with adherence. Medication adherence appears suboptimal. Additional pharmacotherapy is warranted, however, will discuss this further in future. Began process for patient assistance application of empagliflozin (Jardiance) and goal in meantime is to bring  blood glucose down before initiation as application can take several weeks to process. Will increase insulin degludec Evaristo Bury) from 20 to 24 units (20% increase). Also encouraged patient to take evening dose of metformin and instructed that if he misses it with dinner that he can take it later in the evening as he tends to snack after dinner. In regards to diet main focus is on reducing sugar intake via drinks. Will have further discussion regarding dietary food choices at follow-up visit. Following instruction patient verbalized understanding of treatment plan.    1. Increased dose of basal insulin degludec Evaristo Bury) to 24 units once daily. 2. Extensively discussed pathophysiology of diabetes, dietary effects on blood sugar control, and recommended lifestyle interventions,  3. Patient will adhere to dietary modifications of reducing sugary drinks and switching to lower/no sugar substitures 4. Counseled on s/sx of and management of hypoglycemia 5. Next A1C anticipated 03/11/2021.  ASCVD risk - primary prevention in patient with diabetes. Last LDL is not controlled. ASCVD risk score is not >20%  - moderate intensity statin indicated. Will discuss at future follow-up visit.  Elavted PHQ-9 Score - Patient seen by PCP on 12/10/20 and initiated fluoxetine (Prozac) 40mg . Medication adherence appears optimal. Will have PCP continue to follow and manage.   Follow-up appointment via telephone in one week and face-to-face in four weeks to review sugar readings. Written patient instructions provided.  This appointment required 90 minutes of patient care (this includes precharting, chart review, review of results, and face-to-face care).  Thank you for involving pharmacy to assist in providing this patient's care.

## 2020-12-17 NOTE — Assessment & Plan Note (Signed)
T2DM is not controlled likely due to challenges with adherence. Medication adherence appears suboptimal. Additional pharmacotherapy is warranted, however, will discuss this further in future. Began process for patient assistance application of empagliflozin (Jardiance) and goal in meantime is to bring blood glucose down before initiation as application can take several weeks to process. Will increase insulin degludec Evaristo Bury) from 20 to 24 units (20% increase). Also encouraged patient to take evening dose of metformin and instructed that if he misses it with dinner that he can take it later in the evening as he tends to snack after dinner. In regards to diet main focus is on reducing sugar intake via drinks. Will have further discussion regarding dietary food choices at follow-up visit. Following instruction patient verbalized understanding of treatment plan.    1. Increased dose of basal insulin degludec Evaristo Bury) to 24 units once daily. 2. Extensively discussed pathophysiology of diabetes, dietary effects on blood sugar control, and recommended lifestyle interventions,  3. Patient will adhere to dietary modifications of reducing sugary drinks and switching to lower/no sugar substitures 4. Counseled on s/sx of and management of hypoglycemia 5. Next A1C anticipated 03/11/2021.

## 2020-12-17 NOTE — Assessment & Plan Note (Signed)
ASCVD risk - primary prevention in patient with diabetes. Last LDL is not controlled. ASCVD risk score is not >20%  - moderate intensity statin indicated. Will discuss at future follow-up visit.

## 2020-12-17 NOTE — Assessment & Plan Note (Signed)
Elavted PHQ-9 Score - Patient seen by PCP on 12/10/20 and initiated fluoxetine (Prozac) 40mg . Medication adherence appears optimal. Will have PCP continue to follow and manage.

## 2020-12-18 MED ORDER — EMPAGLIFLOZIN 10 MG PO TABS
10.0000 mg | ORAL_TABLET | Freq: Every day | ORAL | Status: DC
Start: 2020-12-18 — End: 2022-09-07

## 2020-12-18 MED ORDER — BASAGLAR KWIKPEN 100 UNIT/ML ~~LOC~~ SOPN: 24.0000 [IU] | PEN_INJECTOR | Freq: Every day | SUBCUTANEOUS | Status: DC

## 2020-12-18 NOTE — Addendum Note (Signed)
Addended by: Cheral Almas on: 12/18/2020 11:45 AM   Modules accepted: Orders

## 2020-12-18 NOTE — Progress Notes (Signed)
Submitted application for JARDIANCE to [employer] ([employer]) for patient assistance.   Phone: [phone]  

## 2020-12-24 ENCOUNTER — Telehealth: Payer: Self-pay | Admitting: Pharmacist

## 2020-12-24 NOTE — Telephone Encounter (Signed)
Called patient to discuss blood glucose readings. Patient did not answer and left voicemail requesting call back.

## 2020-12-25 NOTE — Progress Notes (Signed)
Submitted application for BASAGLAR KWIKPEN to [employer] for patient assistance.   Phone: [phone]  

## 2020-12-31 NOTE — Progress Notes (Signed)
Received notification from [employer]) regarding approval for [employer]. Patient assistance approved from 12/19/20 to 12/19/21.  Medication ships to patients home. He rec'd a 90 day supply on 12/25/20. Pt or advocate can call refills in. No auto refills.  Phone: [phone]

## 2021-01-02 IMAGING — DX DG CHEST 1V PORT
1 series · 1 of 1 positions shown · non-contrast
Comparison: None.

CLINICAL DATA: Chest and abdominal pain. Nausea and diarrhea.
Fatigue.

EXAM:
PORTABLE CHEST 1 VIEW

[chest ap]
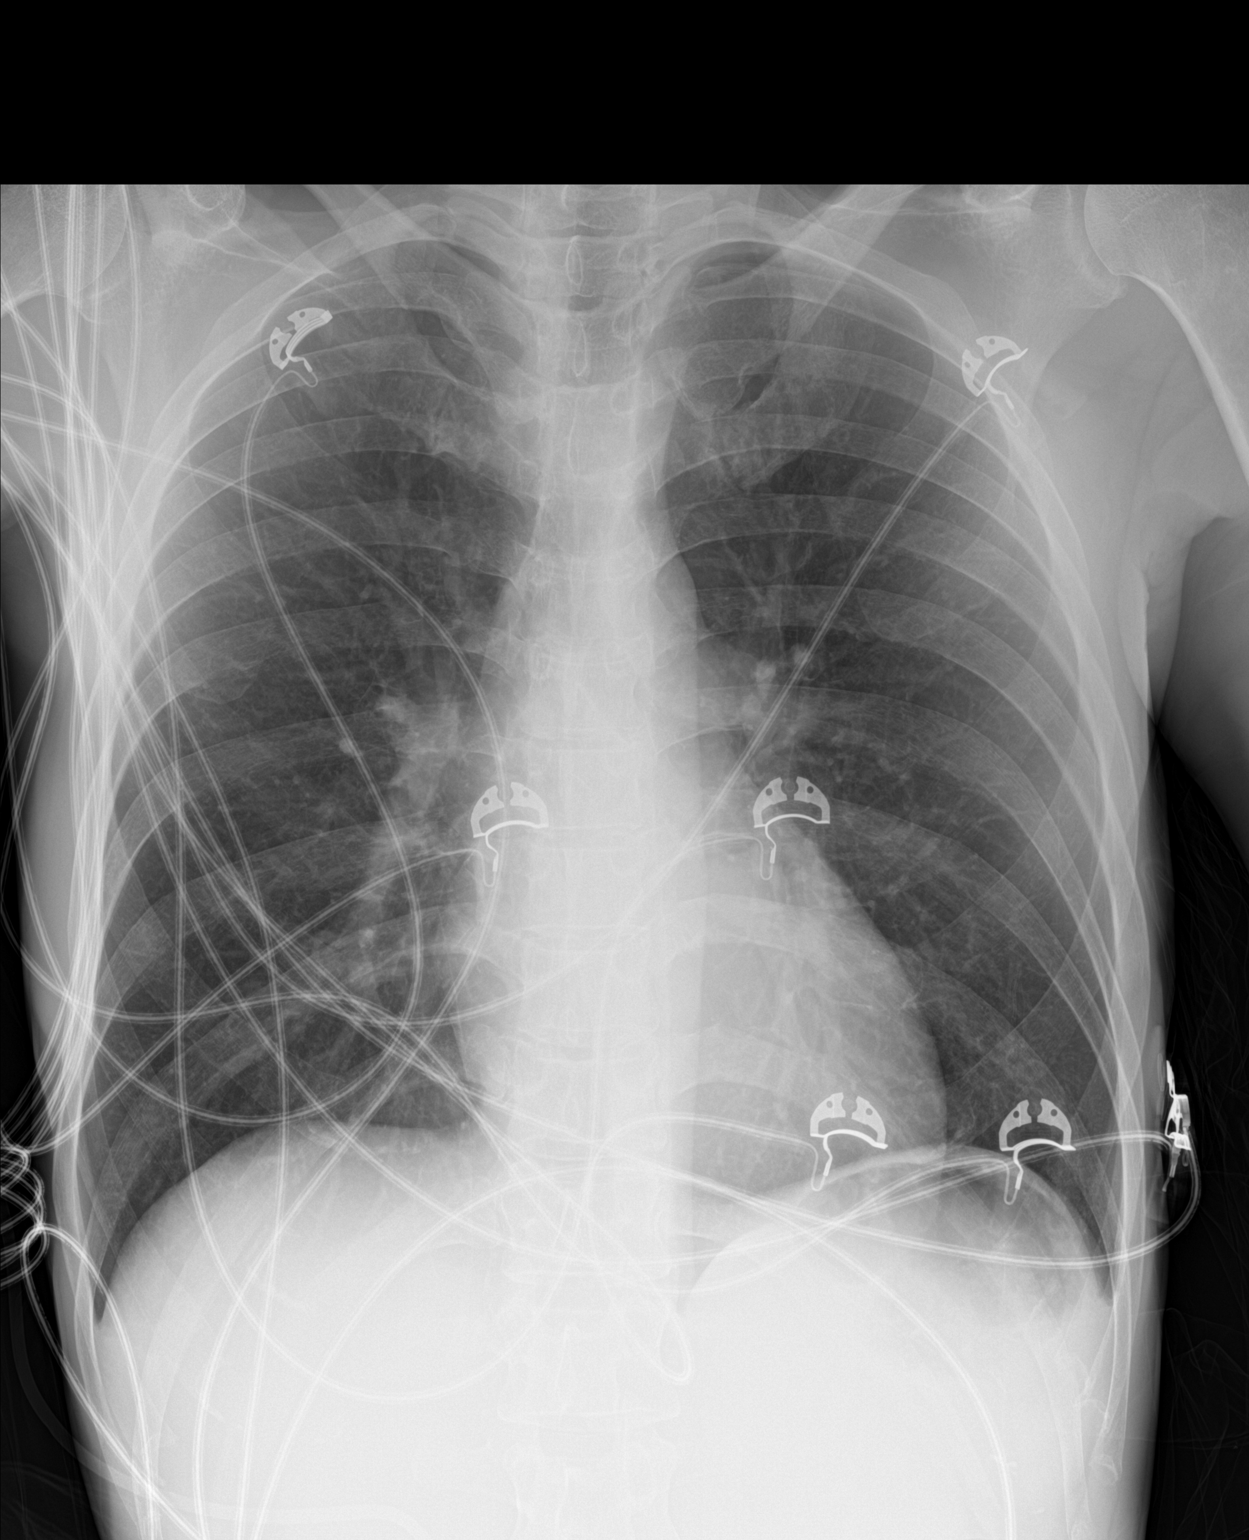

[1 of 1 positions shown; findings below may reference images not displayed]

FINDINGS: Multiple overlying monitoring devices. The cardiomediastinal
contours are normal. The lungs are clear. Pulmonary vasculature is
normal. No consolidation, pleural effusion, or pneumothorax. No
acute osseous abnormalities are seen.
IMPRESSION: Unremarkable portable AP view of the chest.

## 2021-01-08 ENCOUNTER — Telehealth: Payer: Self-pay | Admitting: Pharmacist

## 2021-01-08 NOTE — Telephone Encounter (Signed)
Called patient to verify that he received shipment of Basaglar from [place].   Patient was very appreciative on the phone and states he did receive the shipment and plans to begin taking today. He ran out of [place] 5 days ago. Instructed patient that if he is ever getting low on medication again in the future to contact the pharmacy or our office so we can prevent this from happening.  Reminded patient that we have a follow-up appt on 5/18. Instructed patient to bring in his glucometer to review his blood glucose readings. Patient stated he has not been checking his blood glucose since running out of insulin, but will be sure to start checking again starting today.  Patient had no further questions.

## 2021-01-08 NOTE — Progress Notes (Signed)
Received notification from [facility] [employer] regarding approval for [facility]. Patient assistance approved from 12/25/20 to 12/25/21.  Medication ships to pt home. Auto refills. Should have rec'd first shipment 01/07/21 for a 39-month supply.  Phone: [phone]

## 2021-01-08 NOTE — Progress Notes (Signed)
Thanks Durward Mallard - I spoke with patient and he received it!

## 2021-01-14 ENCOUNTER — Ambulatory Visit: Payer: Self-pay | Admitting: Pharmacist

## 2021-01-14 ENCOUNTER — Other Ambulatory Visit: Payer: Self-pay

## 2021-01-14 ENCOUNTER — Ambulatory Visit (INDEPENDENT_AMBULATORY_CARE_PROVIDER_SITE_OTHER): Payer: Self-pay | Admitting: Pharmacist

## 2021-01-14 VITALS — BP 110/70

## 2021-01-14 DIAGNOSIS — Z794 Long term (current) use of insulin: Secondary | ICD-10-CM

## 2021-01-14 DIAGNOSIS — E1165 Type 2 diabetes mellitus with hyperglycemia: Secondary | ICD-10-CM

## 2021-01-14 DIAGNOSIS — E785 Hyperlipidemia, unspecified: Secondary | ICD-10-CM

## 2021-01-14 LAB — POCT URINALYSIS DIP (MANUAL ENTRY)
Bilirubin, UA: NEGATIVE
Blood, UA: NEGATIVE
Glucose, UA: >=1000 mg/dL — AB
Ketones, POC UA: NEGATIVE mg/dL
Leukocytes, UA: NEGATIVE
Nitrite, UA: NEGATIVE
Protein Ur, POC: NEGATIVE mg/dL
Spec Grav, UA: 1.015 (ref 1.010–1.025)
Urobilinogen, UA: 0.2 E.U./dL
pH, UA: 7 (ref 5.0–8.0)

## 2021-01-14 LAB — GLUCOSE, POCT (MANUAL RESULT ENTRY): POC Glucose: 449 mg/dl — AB (ref 70–99)

## 2021-01-14 MED ORDER — TRUE METRIX BLOOD GLUCOSE TEST VI STRP
ORAL_STRIP | 0 refills | Status: DC
  Filled 2021-01-14: qty 100, 25d supply, fill #0

## 2021-01-14 MED ORDER — TRUE METRIX METER W/DEVICE KIT
PACK | 0 refills | Status: DC
Start: 2021-01-14 — End: 2023-07-06
  Filled 2021-01-14: qty 1, 1d supply, fill #0

## 2021-01-14 MED ORDER — LANCETS MISC
0 refills | Status: DC
Start: 2021-01-14 — End: 2022-09-21
  Filled 2021-01-14: qty 100, 25d supply, fill #0

## 2021-01-14 MED ORDER — BD PEN NEEDLE NANO U/F 32G X 4 MM MISC
1 refills | Status: DC
  Filled 2021-01-14: qty 100, 100d supply, fill #0

## 2021-01-14 MED FILL — Metformin HCl Tab ER 24HR 500 MG: ORAL | 30 days supply | Qty: 120 | Fill #0 | Status: AC

## 2021-01-14 MED FILL — Metformin HCl Tab ER 24HR 500 MG: ORAL | 30 days supply | Qty: 120 | Fill #0 | Status: CN
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
## 2021-01-14 NOTE — Patient Instructions (Addendum)
Mr. Specht it was a pleasure seeing you today.   Please do the following:  1. Continue taking your medications as directed today during your appointment. If you have any questions or if you believe something has occurred because of this change, call me or your doctor to let one of [place] know.  2. TAKE TWO TABLETS OF METFORMIN TWICE A DAY 3. Continue checking blood sugars at home. It's really important that you record these and bring these in to your next doctor's appointment.  4. Continue making the lifestyle changes we've discussed together during our visit. Diet and exercise play a significant role in improving your blood sugars.  5. Follow-up with me in four weeks.  6. If you need to reach me my phone number is [phone]. I will call you in one week to review your blood sugar readings.    Hypoglycemia or low blood sugar:   Low blood sugar can happen quickly and may become an emergency if not treated right away.   While this shouldn't happen often, it can be brought upon if you skip a meal or do not eat enough. Also, if your insulin or other diabetes medications are dosed too high, this can cause your blood sugar to go to low.   Warning signs of low blood sugar include: 1. Feeling shaky or dizzy 2. Feeling weak or tired  3. Excessive hunger 4. Feeling anxious or upset  5. Sweating even when you aren't exercising  What to do if I experience low blood sugar? Follow the Rule of 15 1. Check your blood sugar with your meter. If lower than 70, proceed to step 2.  2. Treat with 15 grams of fast acting carbs which is found in 3-4 glucose tablets. If none are available you can try hard candy, 1 tablespoon of sugar or honey,4 ounces of fruit juice, or 6 ounces of REGULAR soda.  3. Re-check your sugar in 15 minutes. If it is still below 70, do what you did in step 2 again. If your blood sugar has come back up, go ahead and eat a snack or small meal made up of complex carbs (ex. Whole grains) and  protein at this time to avoid recurrence of low blood sugar.

## 2021-02-04 ENCOUNTER — Ambulatory Visit: Payer: Self-pay | Admitting: Family Medicine

## 2021-02-05 ENCOUNTER — Telehealth: Payer: Self-pay | Admitting: Pharmacist
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
## 2021-02-09 ENCOUNTER — Ambulatory Visit: Payer: Self-pay | Admitting: Pharmacist

## 2021-02-10 ENCOUNTER — Ambulatory Visit: Payer: Self-pay | Admitting: Pharmacist

## 2021-02-11 ENCOUNTER — Ambulatory Visit: Payer: Self-pay | Admitting: Pharmacist

## 2021-02-12 ENCOUNTER — Ambulatory Visit: Payer: Self-pay | Admitting: Pharmacist

## 2021-02-18 ENCOUNTER — Ambulatory Visit: Payer: Self-pay | Admitting: Pharmacist

## 2021-03-09 ENCOUNTER — Ambulatory Visit: Payer: Self-pay | Admitting: Pharmacist

## 2021-03-23 ENCOUNTER — Telehealth: Payer: Self-pay | Admitting: Pharmacist

## 2021-06-15 ENCOUNTER — Ambulatory Visit: Payer: Self-pay | Admitting: Pharmacist

## 2021-06-17 ENCOUNTER — Encounter: Payer: Self-pay | Admitting: Pharmacist

## 2021-06-17 ENCOUNTER — Ambulatory Visit (INDEPENDENT_AMBULATORY_CARE_PROVIDER_SITE_OTHER): Payer: Self-pay | Admitting: Pharmacist

## 2021-06-17 ENCOUNTER — Other Ambulatory Visit: Payer: Self-pay

## 2021-06-17 DIAGNOSIS — E1165 Type 2 diabetes mellitus with hyperglycemia: Secondary | ICD-10-CM

## 2021-06-17 DIAGNOSIS — Z794 Long term (current) use of insulin: Secondary | ICD-10-CM

## 2021-06-17 LAB — POCT GLYCOSYLATED HEMOGLOBIN (HGB A1C): HbA1c, POC (controlled diabetic range): 12.6 % — AB (ref 0.0–7.0)

## 2021-06-17 MED ORDER — METFORMIN HCL 1000 MG PO TABS: 1000.0000 mg | ORAL_TABLET | Freq: Two times a day (BID) | ORAL | 0 refills | Status: DC

## 2021-06-17 MED ORDER — TRUE METRIX BLOOD GLUCOSE TEST VI STRP: ORAL_STRIP | 0 refills | Status: DC

## 2021-06-17 MED ORDER — BD PEN NEEDLE NANO U/F 32G X 4 MM MISC: 1 refills | Status: DC

## 2021-06-17 NOTE — Progress Notes (Signed)
Subjective:    Patient ID: Matthew Lynch, male    DOB: [birth year], 50 y.o.   MRN: [id number]  HPI Patient is a 50 y.o. male who presents for diabetes management. He is in okay spirits and presents without assistance. Patient was referred and last seen by Primary Care Provider on 12/10/20. Last seen in pharmacy clinic on 01/14/21.  Patient has history of non-compliance and non-adherence both to provider visits and medication regimen. He reports he has been sporadically taking all of his medication stating the reason is just motivation stay adherent. He reports he has not been feeling well and that his blood glucose readings have continued to be elevated.  Insurance coverage/medication affordability: self-pay  Current diabetes medications include: empagliflozin (Jardiance) 10mg , insulin glargine (Basaglar) 24 units daily, metformin 500mg  2 tabs BID Patient states that He is not taking his diabetes medications as prescribed. Patient denies adherence with medications. Patient states that He misses his medications most days per week.  Does you feel that your medications are working for you?  Unsure as he is not adherent  Have you been experiencing any side effects to the medications prescribed? no  Do you have any problems obtaining medications due to transportation or finances?  no    Patient denies hypoglycemic events. Patient reports polyuria (increased urination).  Patient reports polyphagia (increased appetite).  Patient reports polydipsia (increased thirst).  Patient reports neuropathy (nerve pain). Patient reports visual changes. Patient denies self foot exams.   Patient has been sporadically checking his fasting blood glucose and over the past 30 days readings have been: 274, 415, 370, 359  Objective:   Labs:   Lab Results  Component Value Date   HGBA1C 12.6 (A) 06/17/2021   HGBA1C 13.8 (A) 12/10/2020   HGBA1C 13.0 (A) 08/07/2020   Lab Results  Component Value Date    POCGLU 449 (A) 01/14/2021    Lab Results  Component Value Date   MICRALBCREAT <30 12/10/2020    Lipid Panel     Component Value Date/Time   CHOL 216 (H) 12/10/2020 0928   TRIG 67 12/10/2020 0928   HDL 52 12/10/2020 0928   CHOLHDL 4.2 12/10/2020 0928   CHOLHDL 5.2 01/25/2020 1308   VLDL 54 (H) 01/25/2020 1308   LDLCALC 152 (H) 12/10/2020 0928    Clinical Atherosclerotic Cardiovascular Disease (ASCVD): No  The 10-year ASCVD risk score (Arnett DK, et al., 2019) is: 14.2%   Values used to calculate the score:     Age: 56 years     Sex: Male     Is Non-Hispanic African American: Yes     Diabetic: Yes     Tobacco smoker: Yes     Systolic Blood Pressure: 123 mmHg     Is BP treated: No     HDL Cholesterol: 52 mg/dL     Total Cholesterol: 216 mg/dL   Assessment/Plan:   2020 is not controlled likely due to suboptimal medication adherence and follow-up. Additional pharmacotherapy is not warranted at this time. Re-visited discussion with patient regarding risks of chronically elevated blood glucose. Patient agreeable to check blood glucose daily and to resume medication regimen as reported below. Following instruction patient verbalized understanding of treatment plan.    Re-started basal insulin glargine (Basaglar) 10 units once daily and increase by 1 unit a day for every time FBG >150 Continued SGLT2-I empagliflozin (Jardiance) 10mg   Re-started metformin 500mg  BID with plan to increase to 1000mg  BID at appointment next week Extensively discussed pathophysiology of diabetes,  dietary effects on blood sugar control, and recommended lifestyle interventions  Patient will adhere to dietary modifications Counseled on s/sx of and management of hypoglycemia Next A1C anticipated January 2023  ASCVD risk - primary prevention in patient with diabetes. Last LDL is not controlled. ASCVD risk score is not >20%  - moderate intensity statin indicated. Aspirin is indicated.   In the future will  discuss addition of statin therapy. Patient is currently nonadherent to current regimen therefore unlikely to be adherent to statin therapy at this time.  Follow-up appointment one week to review blood glucose readings. Written patient instructions provided.  This appointment required 40 minutes of direct patient care.  Thank you for involving pharmacy to assist in providing this patient's care.

## 2021-06-17 NOTE — Patient Instructions (Signed)
Matthew Lynch it was a pleasure seeing you today.   Please do the following:  Continue Jardiance 10mg , Basaglar insulin 10 units, metformin 1000mg  one tablet twice a day as directed today during your appointment. If you have any questions or if you believe something has occurred because of this change, call me or your doctor to let one of know.  Continue checking blood sugars at home. It's really important that you record these and bring these in to your next doctor's appointment.  Continue making the lifestyle changes we've discussed together during our visit. Diet and exercise play a significant role in improving your blood sugars.  Follow-up with me next week and Dr. in two weeks.    Hypoglycemia or low blood sugar:   Low blood sugar can happen quickly and may become an emergency if not treated right away.   While this shouldn't happen often, it can be brought upon if you skip a meal or do not eat enough. Also, if your insulin or other diabetes medications are dosed too high, this can cause your blood sugar to go to low.   Warning signs of low blood sugar include: Feeling shaky or dizzy Feeling weak or tired  Excessive hunger Feeling anxious or upset  Sweating even when you aren't exercising  What to do if I experience low blood sugar? Follow the Rule of 15 Check your blood sugar with your meter. If lower than 70, proceed to step 2.  Treat with 15 grams of fast acting carbs which is found in 3-4 glucose tablets. If none are available you can try hard candy, 1 tablespoon of sugar or honey,4 ounces of fruit juice, or 6 ounces of REGULAR soda.  Re-check your sugar in 15 minutes. If it is still below 70, do what you did in step 2 again. If your blood sugar has come back up, go ahead and eat a snack or small meal made up of complex carbs (ex. Whole grains) and protein at this time to avoid recurrence of low blood sugar.

## 2021-06-18 NOTE — Assessment & Plan Note (Signed)
T2DM is not controlled likely due to suboptimal medication adherence and follow-up. Additional pharmacotherapy is not warranted at this time. Re-visited discussion with patient regarding risks of chronically elevated blood glucose. Patient agreeable to check blood glucose daily and to resume medication regimen as reported below. Following instruction patient verbalized understanding of treatment plan.    1. Re-started basal insulin glargine (Basaglar) 10 units once daily and increase by 1 unit a day for every time FBG >150 2. Continued SGLT2-I empagliflozin (Jardiance) 10mg   3. Re-started metformin 500mg  BID with plan to increase to 1000mg  BID at appointment next week 4. Extensively discussed pathophysiology of diabetes, dietary effects on blood sugar control, and recommended lifestyle interventions  5. Patient will adhere to dietary modifications 6. Counseled on s/sx of and management of hypoglycemia 7. Next A1C anticipated January 2023

## 2021-06-18 NOTE — Assessment & Plan Note (Signed)
>>  ASSESSMENT AND PLAN FOR TYPE 2 DIABETES MELLITUS WITH HYPERGLYCEMIA (HCC) WRITTEN ON 06/18/2021  9:25 AM BY KELLEY, RACHELLE, RPH-CPP  T2DM is not controlled likely due to suboptimal medication adherence and follow-up. Additional pharmacotherapy is not warranted at this time. Re-visited discussion with patient regarding risks of chronically elevated blood glucose. Patient agreeable to check blood glucose daily and to resume medication regimen as reported below. Following instruction patient verbalized understanding of treatment plan.    Re-started basal insulin glargine (Basaglar) 10 units once daily and increase by 1 unit a day for every time FBG >150 Continued SGLT2-I empagliflozin (Jardiance) 10mg   Re-started metformin 500mg  BID with plan to increase to 1000mg  BID at appointment next week Extensively discussed pathophysiology of diabetes, dietary effects on blood sugar control, and recommended lifestyle interventions  Patient will adhere to dietary modifications Counseled on s/sx of and management of hypoglycemia Next A1C anticipated January 2023

## 2021-06-24 ENCOUNTER — Other Ambulatory Visit: Payer: Self-pay

## 2021-06-24 ENCOUNTER — Ambulatory Visit: Payer: Self-pay | Admitting: Pharmacist

## 2021-06-24 MED ORDER — METFORMIN HCL 1000 MG PO TABS
1000.0000 mg | ORAL_TABLET | Freq: Two times a day (BID) | ORAL | 0 refills | Status: DC
  Filled 2021-06-24: qty 60, 30d supply, fill #0

## 2021-06-24 MED ORDER — TRUE METRIX BLOOD GLUCOSE TEST VI STRP
ORAL_STRIP | 0 refills | Status: DC
  Filled 2021-06-24: qty 100, 25d supply, fill #0

## 2021-06-24 MED ORDER — BD PEN NEEDLE NANO U/F 32G X 4 MM MISC
1 refills | Status: DC
  Filled 2021-06-24: qty 100, 90d supply, fill #0

## 2021-06-24 NOTE — Addendum Note (Signed)
Addended by: Cheral Almas on: 06/24/2021 03:15 PM   Modules accepted: Orders

## 2021-06-26 ENCOUNTER — Other Ambulatory Visit: Payer: Self-pay

## 2021-07-02 ENCOUNTER — Ambulatory Visit: Payer: Self-pay | Admitting: Family Medicine

## 2021-07-02 NOTE — Progress Notes (Deleted)
    SUBJECTIVE:   CHIEF COMPLAINT / HPI:   Back pain: 50 year old male with back pain which is been going on since***.  He states***.  No loss of bowel/bladder function, no saddle paresthesias***.  PERTINENT  PMH / PSH: ***  OBJECTIVE:   There were no vitals taken for this visit. ***  General: NAD, pleasant, able to participate in exam Cardiac: RRR, no murmurs. Respiratory: CTAB, normal effort, No wheezes, rales or rhonchi MSK: No midline tenderness to palpation of the spine, some discomfort when palpating lateral to the spine on the***side in the area of the muscles.  Negative straight leg raise testing*** Neuro: alert, no obvious focal deficits Psych: Normal affect and mood  ASSESSMENT/PLAN:   No problem-specific Assessment & Plan notes found for this encounter.   Chronic back pain: 50 year old male with chronic back pain for***  Jackelyn Poling, DO [facility]    {    This will disappear when note is signed, click to select method of visit    :1}

## 2021-07-06 ENCOUNTER — Ambulatory Visit: Payer: Self-pay | Admitting: Pharmacist

## 2021-07-06 ENCOUNTER — Other Ambulatory Visit: Payer: Self-pay

## 2021-07-06 NOTE — Progress Notes (Signed)
Error

## 2021-07-07 ENCOUNTER — Other Ambulatory Visit: Payer: Self-pay

## 2021-07-07 ENCOUNTER — Ambulatory Visit (INDEPENDENT_AMBULATORY_CARE_PROVIDER_SITE_OTHER): Payer: Self-pay | Admitting: Family Medicine

## 2021-07-07 DIAGNOSIS — F339 Major depressive disorder, recurrent, unspecified: Secondary | ICD-10-CM

## 2021-07-07 DIAGNOSIS — E1165 Type 2 diabetes mellitus with hyperglycemia: Secondary | ICD-10-CM

## 2021-07-07 DIAGNOSIS — Z794 Long term (current) use of insulin: Secondary | ICD-10-CM

## 2021-07-07 NOTE — Patient Instructions (Addendum)
It was nice to meet you!  Things we discussed at today's visit: - I worry about your poorly controlled diabetes - Your high sugars are wreaking havoc on your organs. - It is important to take all your medications as prescribed. The longer you take your medications, the better you will feel (even if it doesn't seem that way at first). - I will discuss alternative options that may involve fewer needles with Dr. Nicholaus Bloom.  Take care and seek immediate care sooner if you develop any concerns.  Dr. [facility]   Outpatient Mental Health Providers (No Insurance required or Self Pay)  [facility]  [address] [place], [place] [phone] Crisis [phone]  [facility] walk in M-F 8am-12pm and  1pm-3pm [place]- [address]     [phone]  [facility] -[address]  Phone: [phone]  [facility]  [place] [address] [place], [place]     Phone:  [phone] [place]-  [address]  [phone]   [facility]  [address] [place]  [phone] [email or web address]    24- Hour Availability:  [facility] [phone] or [phone] [facility] [place] Crisis [phone] [place] [employer] [phone] or [phone] * [facility] [employer] [phone] (after hours) *[facility] Unit [phone] *[place] National Suicide Hotline (914)409-6096 Len Childs)   Support in a Crisis What if I or someone I know is in crisis?  If you are thinking about harming yourself or having thoughts of suicide, or if you know someone who is, seek help right away. Call your doctor or mental health care provider. Call 911 or go to a hospital emergency room to get immediate help, or ask a friend or family member to help you do these  things. Call the [place] National Suicide Prevention Lifeline's toll-free, 24-hour hotline at 1-800-273-TALK 908-129-8827) or TTY: 1-800-799-4 TTY (901)098-7387) to talk to a trained counselor. If you are in crisis, make sure you are not left alone.  If someone else is in crisis, make sure he or she is not left alone

## 2021-07-07 NOTE — Progress Notes (Signed)
    SUBJECTIVE:   CHIEF COMPLAINT / HPI:   Diabetes Patient would like to discuss his elevated sugars. Specifically, he is wondering if he's damaging any organs. Reports his sugars are typically in the 400s and admits he does not take his medications as prescribed. Previously had financial barriers to medication compliance. States this has been resolved with the help of Dr. Nicholaus Bloom.  Now his biggest barrier is that he's afraid of needles. Also feels unwell after taking his medication for a few days. Endorses polydipsia and symptoms of peripheral neuropathy in his feet. He is asking about CGM/insulin pumps.  Prescribed meds: Metformin 1000mg  BID Jardiance 10mg  daily Glargine (10u per patient report)  Depression Patient scored highly on PHQ-9 today. He does endorse depressed mood and feeling like a failure. Attributes most of this to his diabetes diagnosis. Also relays several life stressors (housing insecurity, multiple child support cases, lost his job, ). He denies SI. States his children keep him going.  Prescribed Fluoxetine 40mg  daily although again, has difficulty with compliance.   PERTINENT  PMH / PSH: T2DM, depression, tobacco abuse, medication nonadherence  OBJECTIVE:   BP 108/62   Pulse 76   Ht 5\' 5"  (1.651 m)   Wt 106 lb 6.4 oz (48.3 kg)   SpO2 96%   BMI 17.71 kg/m   General: NAD, pleasant, able to participate in exam Respiratory: No respiratory distress Skin: warm and dry, no rashes noted Psych: Normal affect and mood Neuro: grossly intact  Diabetic Foot Exam - Simple   Simple Foot Form Diabetic Foot exam was performed with the following findings: Yes 07/07/2021  2:09 PM  Visual Inspection No deformities, no ulcerations, no other skin breakdown bilaterally: Yes Sensation Testing Pulse Check Posterior Tibialis and Dorsalis pulse intact bilaterally: Yes Comments      ASSESSMENT/PLAN:   Type 2 diabetes mellitus with hyperglycemia (HCC) A1c 12.6% on  06/17/2021. Poorly controlled due to medication nonadherence. -Counseled extensively on risks of uncontrolled DM -No medication changes today -Emphasized importance of medication adherence -Discussed that he will likely see improvement in his symptoms with improved glycemic control -Will discuss CGM options with pharmacy team -Follow up with Dr. in 1 week  Depression, major, single episode, moderate (HCC) PHQ-9 score of 24 today, negative to question #9. Despite significant depressive symptoms, he denies passive or active SI. He is not particularly interested in therapy at this time.  -Continue Fluoxetine 40mg  daily -Once again emphasized importance of medication adherence -Given counseling and crisis resources in AVS    , MD [facility] [place]

## 2021-07-08 NOTE — Assessment & Plan Note (Signed)
>>  ASSESSMENT AND PLAN FOR TYPE 2 DIABETES MELLITUS WITH HYPERGLYCEMIA (HCC) WRITTEN ON 07/08/2021 11:13 AM BY Maury Dus, MD  A1c 12.6% on 06/17/2021. Poorly controlled due to medication nonadherence. -Counseled extensively on risks of uncontrolled DM -No medication changes today -Emphasized importance of medication adherence -Discussed that he will likely see improvement in his symptoms with improved glycemic control -Will discuss CGM options with pharmacy team -Follow up with Dr. Nicholaus Bloom in 1 week

## 2021-07-08 NOTE — Assessment & Plan Note (Signed)
PHQ-9 score of 24 today, negative to question #9. Despite significant depressive symptoms, he denies passive or active SI. He is not particularly interested in therapy at this time.  -Continue Fluoxetine 40mg  daily -Once again emphasized importance of medication adherence -Given counseling and crisis resources in AVS

## 2021-07-08 NOTE — Assessment & Plan Note (Signed)
A1c 12.6% on 06/17/2021. Poorly controlled due to medication nonadherence. -Counseled extensively on risks of uncontrolled DM -No medication changes today -Emphasized importance of medication adherence -Discussed that he will likely see improvement in his symptoms with improved glycemic control -Will discuss CGM options with pharmacy team -Follow up with Dr. Nicholaus Bloom in 1 week

## 2021-07-13 ENCOUNTER — Ambulatory Visit: Payer: Self-pay | Admitting: Pharmacist

## 2021-08-12 ENCOUNTER — Ambulatory Visit: Payer: Self-pay | Admitting: Pharmacist

## 2021-08-13 ENCOUNTER — Ambulatory Visit: Payer: Self-pay | Admitting: Pharmacist

## 2021-08-13 NOTE — Progress Notes (Incomplete)
Subjective:    Patient ID: Matthew Lynch, male    DOB: 1971/08/06, 50 y.o.   MRN: [id number]  HPI Patient is a 50 y.o. male who presents for diabetes management. He is in okay spirits and presents without assistance. Patient was referred and last seen by Primary Care Provider on 12/10/20. Last seen in pharmacy clinic on 01/14/21.  Patient has history of non-compliance and non-adherence both to provider visits and medication regimen. He reports he has been sporadically taking all of his medication stating the reason is just motivation stay adherent. He reports he has not been feeling well and that his blood glucose readings have continued to be elevated.  Insurance coverage/medication affordability: self-pay  Current diabetes medications include: empagliflozin (Jardiance) 10mg , insulin glargine (Basaglar) 24 units daily, metformin 500mg  2 tabs BID Patient states that He is not taking his diabetes medications as prescribed. Patient denies adherence with medications. Patient states that He misses his medications most days per week.  Does you feel that your medications are working for you?  Unsure as he is not adherent  Have you been experiencing any side effects to the medications prescribed? no  Do you have any problems obtaining medications due to transportation or finances?  no    Patient denies hypoglycemic events. Patient reports polyuria (increased urination).  Patient reports polyphagia (increased appetite).  Patient reports polydipsia (increased thirst).  Patient reports neuropathy (nerve pain). Patient reports visual changes. Patient denies self foot exams.   Patient has been sporadically checking his fasting blood glucose and over the past 30 days readings have been: 274, 415, 370, 359  Objective:   Labs:   Lab Results  Component Value Date   HGBA1C 12.6 (A) 06/17/2021   HGBA1C 13.8 (A) 12/10/2020   HGBA1C 13.0 (A) 08/07/2020   Lab Results  Component Value Date    POCGLU 449 (A) 01/14/2021    Lab Results  Component Value Date   MICRALBCREAT <30 12/10/2020    Lipid Panel     Component Value Date/Time   CHOL 216 (H) 12/10/2020 0928   TRIG 67 12/10/2020 0928   HDL 52 12/10/2020 0928   CHOLHDL 4.2 12/10/2020 0928   CHOLHDL 5.2 01/25/2020 1308   VLDL 54 (H) 01/25/2020 1308   LDLCALC 152 (H) 12/10/2020 0928    Clinical Atherosclerotic Cardiovascular Disease (ASCVD): No  The 10-year ASCVD risk score (Arnett DK, et al., 2019) is: 11.4%   Values used to calculate the score:     Age: 54 years     Sex: Male     Is Non-Hispanic African American: Yes     Diabetic: Yes     Tobacco smoker: Yes     Systolic Blood Pressure: 108 mmHg     Is BP treated: No     HDL Cholesterol: 52 mg/dL     Total Cholesterol: 216 mg/dL   Assessment/Plan:   2020 is not controlled likely due to suboptimal medication adherence and follow-up. Additional pharmacotherapy is not warranted at this time. Re-visited discussion with patient regarding risks of chronically elevated blood glucose. Patient agreeable to check blood glucose daily and to resume medication regimen as reported below. Following instruction patient verbalized understanding of treatment plan.    Re-started basal insulin glargine (Basaglar) 10 units once daily and increase by 1 unit a day for every time FBG >150 Continued SGLT2-I empagliflozin (Jardiance) 10mg   Re-started metformin 500mg  BID with plan to increase to 1000mg  BID at appointment next week Extensively discussed pathophysiology of diabetes,  dietary effects on blood sugar control, and recommended lifestyle interventions  Patient will adhere to dietary modifications Counseled on s/sx of and management of hypoglycemia Next A1C anticipated January 2023  ASCVD risk - primary prevention in patient with diabetes. Last LDL is not controlled. ASCVD risk score is not >20%  - moderate intensity statin indicated. Aspirin is indicated.   In the future will  discuss addition of statin therapy. Patient is currently nonadherent to current regimen therefore unlikely to be adherent to statin therapy at this time.  Follow-up appointment one week to review blood glucose readings. Written patient instructions provided.  This appointment required 40 minutes of direct patient care.  Thank you for involving pharmacy to assist in providing this patient's care.

## 2021-10-21 ENCOUNTER — Other Ambulatory Visit (HOSPITAL_COMMUNITY): Payer: Self-pay

## 2022-02-24 DIAGNOSIS — R102 Pelvic and perineal pain: Secondary | ICD-10-CM | POA: Diagnosis not present

## 2022-02-24 DIAGNOSIS — I700 Atherosclerosis of aorta: Secondary | ICD-10-CM | POA: Diagnosis not present

## 2022-02-24 DIAGNOSIS — M545 Low back pain, unspecified: Secondary | ICD-10-CM | POA: Diagnosis not present

## 2022-02-24 DIAGNOSIS — R32 Unspecified urinary incontinence: Secondary | ICD-10-CM | POA: Diagnosis not present

## 2022-02-24 DIAGNOSIS — R109 Unspecified abdominal pain: Secondary | ICD-10-CM | POA: Diagnosis not present

## 2022-02-24 DIAGNOSIS — R159 Full incontinence of feces: Secondary | ICD-10-CM | POA: Diagnosis not present

## 2022-02-24 DIAGNOSIS — E1165 Type 2 diabetes mellitus with hyperglycemia: Secondary | ICD-10-CM | POA: Diagnosis not present

## 2022-02-24 DIAGNOSIS — M40204 Unspecified kyphosis, thoracic region: Secondary | ICD-10-CM | POA: Diagnosis not present

## 2022-04-23 ENCOUNTER — Encounter: Payer: Self-pay | Admitting: Family Medicine

## 2022-04-23 ENCOUNTER — Ambulatory Visit (INDEPENDENT_AMBULATORY_CARE_PROVIDER_SITE_OTHER): Payer: 59 | Admitting: Family Medicine

## 2022-04-23 ENCOUNTER — Other Ambulatory Visit: Payer: Self-pay

## 2022-04-23 VITALS — BP 112/80 | HR 104 | Wt 103.6 lb

## 2022-04-23 DIAGNOSIS — R32 Unspecified urinary incontinence: Secondary | ICD-10-CM | POA: Diagnosis not present

## 2022-04-23 DIAGNOSIS — E1165 Type 2 diabetes mellitus with hyperglycemia: Secondary | ICD-10-CM

## 2022-04-23 DIAGNOSIS — Z794 Long term (current) use of insulin: Secondary | ICD-10-CM

## 2022-04-23 DIAGNOSIS — R1084 Generalized abdominal pain: Secondary | ICD-10-CM | POA: Diagnosis not present

## 2022-04-23 DIAGNOSIS — R109 Unspecified abdominal pain: Secondary | ICD-10-CM | POA: Insufficient documentation

## 2022-04-23 DIAGNOSIS — R159 Full incontinence of feces: Secondary | ICD-10-CM

## 2022-04-23 LAB — POCT GLYCOSYLATED HEMOGLOBIN (HGB A1C): HbA1c, POC (controlled diabetic range): 13.7 % — AB (ref 0.0–7.0)

## 2022-04-23 MED ORDER — BASAGLAR KWIKPEN 100 UNIT/ML ~~LOC~~ SOPN: 20.0000 [IU] | PEN_INJECTOR | Freq: Every day | SUBCUTANEOUS | 11 refills | Status: DC

## 2022-04-23 NOTE — Assessment & Plan Note (Signed)
>>  ASSESSMENT AND PLAN FOR TYPE 2 DIABETES MELLITUS WITH HYPERGLYCEMIA (HCC) WRITTEN ON 04/23/2022  5:19 PM BY Marsh Dolly, AKHILA, MD  A1c today 13.7, patient has been on 15 units of glargine daily.  Mentions neuropathy through legs and blurry vision. - Increase insulin to 20 units of glargine daily with plans to increase further - Follow-up in 1 to 2 weeks

## 2022-04-23 NOTE — Patient Instructions (Signed)
It was great to see you today! Thank you for choosing [facility] for your primary care. Matthew Lynch was seen for abdominal pain.  Today we addressed: Your abdominal pain - most likely caused by having a lot of stool. This could also be causing your stool incontinence. You can try some miralax daily until you are able to see the Gastroenterologist.  Diabetes - a lot of your symptoms are most likely due to your diabetes. We are increasing your insulin to 20 units daily from 15 units.   If you haven't already, sign up for My Chart to have easy access to your labs results, and communication with your primary care physician.  We are checking some labs today. If they are abnormal, I will call you. If they are normal, I will send you a MyChart message (if it is active) or a letter in the mail. If you do not hear about your labs in the next 2 weeks, please call the office.   You should return to our clinic Return in about 1 week (around 04/30/2022) for follow up diabetes insulin change, abd pain, incontinence.  I recommend that you always bring your medications to each appointment as this makes it easy to ensure you are on the correct medications and helps [place] not miss refills when you need them.  Please arrive 15 minutes before your appointment to ensure smooth check in process.  We appreciate your efforts in making this happen.  Please call the clinic at [phone] if your symptoms worsen or you have any concerns.  Thank you for allowing me to participate in your care, [name], MD 04/23/2022, 3:35 PM PGY-1, [facility]

## 2022-04-23 NOTE — Assessment & Plan Note (Signed)
Generalized abdominal pain with tenderness to palpation in multiple regions.  Positive fecal incontinence.  CT abdomen pelvis negative except for moderate stool burden. - Refer to GI

## 2022-04-23 NOTE — Progress Notes (Cosign Needed)
SUBJECTIVE:   CHIEF COMPLAINT / HPI:   Abdominal pain that is diffuse. He says there are no exacerbating factors.   Stool and bladder incontinence starting 2.5 months ago. Occasionally, he will have dribbling of his urine.  He feels like he has to wake up 8 times at night to pee.  His stool is very liquidy.  When he strains to urinate he has liquid stool come out of his rear end.  He says he has been losing weight (40 pounds) over the last couple years. Patient says that he has not been able to have an erection.  He says that his penis hangs under and he has penile and testicular pain all the time so much so that it makes it difficult to walk this has been going on for the past 8 to 10 months.  Nothing makes it better or worse.  He was diagnosed with diabetes 3 years ago.  He says that he did not consistently take his diabetes medication until the start of this year.  He takes 15 units of glargine daily.  He had significant social stressors including losing his job and did not have a place to stay which made it difficult for him to take care of his diabetes.  He says that he does get lightheaded after this dose occasionally and has to go to sleep after he takes this.  He has never had mealtime insulin.  Has pain down his legs that started with one and now both. Started with the left and now bilateral. Numbness Cant stand for longer than 4 hours without feeling dizzy.   Smokes tobacco, 2 cigs a day, up to half a pack   Had some trauma to his back that he is not able to describe.  He never seen a urologist   PERTINENT  PMH / PSH: Diabetes, depression, hyperlipidemia, tobacco use  OBJECTIVE:   BP 112/80   Pulse (!) 104   Wt 103 lb 9.6 oz (47 kg)   SpO2 100%   BMI 17.24 kg/m   General: Thin, chronically ill man CV: Regular rate and rhythm, well-perfused Respiratory: CTA B, normal work of breathing Abdomen: Diffuse pain to touch, soft movable mass in upper right quadrant similar to  stool, reports pain to palpation, no hepatosplenomegaly - GU: Pain to palpation of penis and testicles, normal sized testicles, metrical Rectal: Normal sphincter tone Psych: Affect abnormal, anxious, unorganized thoughts  ASSESSMENT/PLAN:   Type 2 diabetes mellitus with hyperglycemia (HCC) A1c today 13.7, patient has been on 15 units of glargine daily.  Mentions neuropathy through legs and blurry vision. - Increase insulin to 20 units of glargine daily with plans to increase further - Follow-up in 1 to 2 weeks  Abdominal pain Generalized abdominal pain with tenderness to palpation in multiple regions.  Positive fecal incontinence.  CT abdomen pelvis negative except for moderate stool burden. - Refer to GI  Bowel and bladder incontinence Bowel and bladder incontinence for past 2.5 months. Low concern for neurogenic cause from spinal cord injury or etiology. Neuro exam was unremarkable and rectal sphincter tone was intact. MRIs done in ED were negative. Bladder incontinence most likely secondary to diabetic sequela and neuropathy. Bowel incontinence most likely secondary to constipation (CT with high stool burden) and encopresis.   - Refer to GI for abdominal pain and stool incontinence.  - Bladder diary, will reevaluate at follow up in 1 week to refer to urology for urodynamic testing.  [name], MD [facility]

## 2022-04-23 NOTE — Assessment & Plan Note (Signed)
A1c today 13.7, patient has been on 15 units of glargine daily.  Mentions neuropathy through legs and blurry vision. - Increase insulin to 20 units of glargine daily with plans to increase further - Follow-up in 1 to 2 weeks

## 2022-04-24 LAB — COMPREHENSIVE METABOLIC PANEL
ALT: 96 IU/L — ABNORMAL HIGH (ref 0–44)
AST: 29 IU/L (ref 0–40)
Albumin/Globulin Ratio: 1.8 (ref 1.2–2.2)
Albumin: 4.7 g/dL (ref 4.1–5.1)
Alkaline Phosphatase: 129 IU/L — ABNORMAL HIGH (ref 44–121)
BUN/Creatinine Ratio: 13 (ref 9–20)
BUN: 10 mg/dL (ref 6–24)
Bilirubin Total: 0.5 mg/dL (ref 0.0–1.2)
CO2: 24 mmol/L (ref 20–29)
Calcium: 10.6 mg/dL — ABNORMAL HIGH (ref 8.7–10.2)
Chloride: 93 mmol/L — ABNORMAL LOW (ref 96–106)
Creatinine, Ser: 0.76 mg/dL (ref 0.76–1.27)
Globulin, Total: 2.6 g/dL (ref 1.5–4.5)
Glucose: 384 mg/dL — ABNORMAL HIGH (ref 70–99)
Potassium: 4.4 mmol/L (ref 3.5–5.2)
Sodium: 133 mmol/L — ABNORMAL LOW (ref 134–144)
Total Protein: 7.3 g/dL (ref 6.0–8.5)
eGFR: 110 mL/min/{1.73_m2} (ref >59)

## 2022-04-24 LAB — CBC
Hematocrit: 46.8 % (ref 37.5–51.0)
Hemoglobin: 15.5 g/dL (ref 13.0–17.7)
MCH: 29.8 pg (ref 26.6–33.0)
MCHC: 33.1 g/dL (ref 31.5–35.7)
MCV: 90 fL (ref 79–97)
Platelets: 285 10*3/uL (ref 150–450)
RBC: 5.21 x10E6/uL (ref 4.14–5.80)
RDW: 12.1 % (ref 11.6–15.4)
WBC: 10.9 10*3/uL — ABNORMAL HIGH (ref 3.4–10.8)

## 2022-04-26 DIAGNOSIS — R159 Full incontinence of feces: Secondary | ICD-10-CM | POA: Insufficient documentation

## 2022-04-26 NOTE — Assessment & Plan Note (Signed)
Bowel and bladder incontinence for past 2.5 months. Low concern for neurogenic cause from spinal cord injury or etiology. Neuro exam was unremarkable and rectal sphincter tone was intact. MRIs done in ED were negative. Bladder incontinence most likely secondary to diabetic sequela and neuropathy. Bowel incontinence most likely secondary to constipation (CT with high stool burden) and encopresis.   - Refer to GI for abdominal pain and stool incontinence.  - Bladder diary, will reevaluate at follow up in 1 week to refer to urology for urodynamic testing.

## 2022-04-27 ENCOUNTER — Telehealth: Payer: Self-pay

## 2022-04-27 NOTE — Telephone Encounter (Signed)
Called patient to find out if he is willing to get MRI considering that his insurance is no longer active.  Patient states that he had a MRI at [facility]) less that 1 month ago and he feels that he does not need this especially since he would be self pay.  Please advise.  Glennie Hawk, CMA

## 2022-04-29 ENCOUNTER — Telehealth: Payer: Self-pay | Admitting: Family Medicine

## 2022-04-29 NOTE — Telephone Encounter (Signed)
Called patient regarding MRI. Left VM to call back.  I will be cancelling MRI as patient called clinic requesting to not do it as his insurance has changed.  There is low suspicion for cervical spinal cord etiology. I believe he will benefit more from his GI appointment. He does not have upper extremity symptoms or radiculopathy. The rest of his spinal cord has been imaged and did not show significant abnormality.

## 2022-05-17 ENCOUNTER — Encounter: Payer: Self-pay | Admitting: Family Medicine

## 2022-05-17 ENCOUNTER — Ambulatory Visit (INDEPENDENT_AMBULATORY_CARE_PROVIDER_SITE_OTHER): Payer: 59 | Admitting: Family Medicine

## 2022-05-17 VITALS — BP 102/70 | HR 108 | Ht 65.0 in | Wt 102.2 lb

## 2022-05-17 DIAGNOSIS — E785 Hyperlipidemia, unspecified: Secondary | ICD-10-CM

## 2022-05-17 DIAGNOSIS — E1165 Type 2 diabetes mellitus with hyperglycemia: Secondary | ICD-10-CM | POA: Diagnosis not present

## 2022-05-17 DIAGNOSIS — R1084 Generalized abdominal pain: Secondary | ICD-10-CM

## 2022-05-17 DIAGNOSIS — R636 Underweight: Secondary | ICD-10-CM | POA: Diagnosis not present

## 2022-05-17 DIAGNOSIS — E1140 Type 2 diabetes mellitus with diabetic neuropathy, unspecified: Secondary | ICD-10-CM | POA: Insufficient documentation

## 2022-05-17 DIAGNOSIS — Z794 Long term (current) use of insulin: Secondary | ICD-10-CM | POA: Diagnosis not present

## 2022-05-17 DIAGNOSIS — R69 Illness, unspecified: Secondary | ICD-10-CM | POA: Diagnosis not present

## 2022-05-17 DIAGNOSIS — F339 Major depressive disorder, recurrent, unspecified: Secondary | ICD-10-CM

## 2022-05-17 MED ORDER — INSULIN ASPART (W/NIACINAMIDE) 100 UNIT/ML ~~LOC~~ SOPN: 5.0000 [IU] | PEN_INJECTOR | Freq: Three times a day (TID) | SUBCUTANEOUS | 1 refills | Status: DC

## 2022-05-17 MED ORDER — INSULIN ASPART (W/NIACINAMIDE) 100 UNIT/ML ~~LOC~~ SOPN
PEN_INJECTOR | SUBCUTANEOUS | 0 refills | Status: DC
Start: 2022-05-17 — End: 2022-09-07

## 2022-05-17 MED ORDER — GLUCERNA SHAKE PO LIQD: 237.0000 mL | Freq: Three times a day (TID) | ORAL | 0 refills | Status: DC

## 2022-05-17 MED ORDER — ROSUVASTATIN CALCIUM 10 MG PO TABS: 10.0000 mg | ORAL_TABLET | Freq: Every day | ORAL | 3 refills | Status: DC

## 2022-05-17 MED ORDER — GABAPENTIN 300 MG PO CAPS: 300.0000 mg | ORAL_CAPSULE | Freq: Every day | ORAL | 1 refills | Status: DC

## 2022-05-17 NOTE — Progress Notes (Signed)
SUBJECTIVE:   CHIEF COMPLAINT / HPI:   Matthew Lynch is a 51 y.o. male who presents to the [facility] clinic today to discuss the following concerns:   Generalized Pain Reports pain all over. Feels like needles poking him, feels like electricity. Goes down to his feet, some numbness. Affecting his balance, feels like his is at risk for falling.   Depression Was recently incarcerated for about 1.5 weeks. Went to jail for possession of a stolen gun. He reports that he had it for protection since he feels weak. He denies suicidal ideations. Does not have thoughts of self harm. Denies thoughts of wanting to harm others. Feels that he is strong mentally and has to tell himself that he is okay every day in order to get through the day. He does not feel like his depression is a problem and is moreso concerned about his pain that is hard to deal with.  Uncontrolled Diabetes, Type 2 - Last A1c 13.7 in August  - Medications: Taking 20U of Basaglar. In jail he reports having excellent numbers.  - Compliance: Reports daily compliance  - Checking BG at home: Takes once a day in the morning - Diet: "I eat pretty good".  - Exercise: None - Eye exam: Due - Foot exam: UTD but will do today - Statin: Not on. Will start and check lipids. - Denies symptoms of hypoglycemia, polyuria, polydipsia, numbness extremities, foot ulcers/trauma  Per chart review: was last seen by Dr. Marsh Dolly on 8/25 for diffuse abdominal pain.  Reported intermittent stool and bladder incontinence, occasionally dribbling of his urine.  Was referred to GI for the symptoms and recommended to use a bladder diary, consideration of referral to urology for urodynamic testing.  There is low concern for neurologic cause given negative MRIs in the ED previously. Reviewed previous MRI of his thoracic and lumbar spine on 6/28 which showed no acute abnormality.  There was degenerative disc disease with endplate spurring at L5-S1, mild disc bulging at  L2-3.  He previous had a CT of his abdomen and pelvis which was also negative except for moderate colonic stool burden.  PERTINENT  PMH / PSH: Type 2 diabetes, hyperlipidemia, anxiety depression, chronic bilateral low back pain  OBJECTIVE:   BP 102/70   Pulse (!) 108   Ht 5\' 5"  (1.651 m)   Wt 102 lb 3.2 oz (46.4 kg)   SpO2 98%   BMI 17.01 kg/m    General: NAD, pleasant, able to participate in exam Respiratory: Normal respiratory effort  Foot exam: Dry and calloused feet, no ulcerations, or other skin breakdown on feet bilaterally.  Sensation is reduced to monofilament and light touch in all parts.  PT and DP pulses intact BL.   Psych: Flat affect and depressed mood, tearful     05/17/2022    1:38 PM 07/07/2021    2:22 PM 12/10/2020    8:54 AM  Depression screen PHQ 2/9  Decreased Interest 3 3 2   Down, Depressed, Hopeless 3 3 3   PHQ - 2 Score 6 6 5   Altered sleeping 3 3 3   Tired, decreased energy 3 3 3   Change in appetite 3 3 3   Feeling bad or failure about yourself  3 3 3   Trouble concentrating 3 3 3   Moving slowly or fidgety/restless 3 3 3   Suicidal thoughts 1 0 0  PHQ-9 Score 25 24 23   Difficult doing work/chores   Extremely dIfficult    ASSESSMENT/PLAN:   Type 2 diabetes  mellitus with hyperglycemia ([place]) Poorly controlled diabetes for the last couple years. Discussed extensively that his pain is most likely related to progressive neuropathy from uncontrolled diabetes. Currently only on long-acting insulin and shared decision making to start mealtime insulin (he had the greatest control of sugars while incarcerated as he was on a sliding scale). Will start with 5 units of mealtime insulin once daily. Also given instructions to up-titrate his long-acting insulin for fasting sugars >130. -Amb ref ophthalmology -Lipid panel -Start mod-intensity statin (crestor 10 mg), may increase to high intensity pending ASCVD and lipid panel -Continue Basaglar 20 U, increase by 1 unit  daily for fasting sugars > 130 -Start gabapentin 300 qHS, can increase to BID if tolerating well after the first week    Neuropathy due to type 2 diabetes mellitus ([place]) His description of pain sounds neuropathic. He has had uncontrolled diabetes for the last 2 years.  We extensively discussed the complications that can, uncontrolled diabetes including worsening of his neuropathy.  At this point he seems more motivated to control his sugars. Diabetic foot examination today with decreased sensation to monofilament -Start low-dose Gabapentin 300 mg qHS; if tolerating well x1 week can increase to BID -Educated patient to do foot examinations at home  Abdominal pain Has had CT imaging without acute findings. Given his poorly controlled diabetes and neuropathy, suspect he may have gastroparesis. He was referred to GI previously, I provided him with contact information to schedule an appointment.  Would likely benefit from further work-up such as gastric emptying studies.  Underweight BMI 17. Also likely related to uncontrolled diabetes and his abdominal pain/GI losses.  -Monitor; hopefully will increase as his diabetes is better controlled -Start Glucerna shakes TID for nutritional supplementation   Depression, recurrent (HCC) PHQ-9 score of 25 with positive question #9.  When asked further, patient expressly denies any thoughts of wanting to harm himself.  He no longer has access to weapons.  He is living with his daughter, feel that he is safe at this time.  His depression is likely worsened due to his underlying conditions (neuropathy, abdominal pain, etc).  -Therapy resources provided in AVS -F/u with me next week      Sharion Settler, [place]

## 2022-05-17 NOTE — Assessment & Plan Note (Signed)
PHQ-9 score of 25 with positive question #9.  When asked further, patient expressly denies any thoughts of wanting to harm himself.  He no longer has access to weapons.  He is living with his daughter, feel that he is safe at this time.  His depression is likely worsened due to his underlying conditions (neuropathy, abdominal pain, etc).  -Therapy resources provided in AVS -F/u with me next week

## 2022-05-17 NOTE — Assessment & Plan Note (Signed)
Poorly controlled diabetes for the last couple years. Discussed extensively that his pain is most likely related to progressive neuropathy from uncontrolled diabetes. Currently only on long-acting insulin and shared decision making to start mealtime insulin (he had the greatest control of sugars while incarcerated as he was on a sliding scale). Will start with 5 units of mealtime insulin once daily. Also given instructions to up-titrate his long-acting insulin for fasting sugars >130. -Amb ref ophthalmology -Lipid panel -Start mod-intensity statin (crestor 10 mg), may increase to high intensity pending ASCVD and lipid panel -Continue Basaglar 20 U, increase by 1 unit daily for fasting sugars > 130 -Start gabapentin 300 qHS, can increase to BID if tolerating well after the first week

## 2022-05-17 NOTE — Assessment & Plan Note (Signed)
>>  ASSESSMENT AND PLAN FOR TYPE 2 DIABETES MELLITUS WITH HYPERGLYCEMIA (HCC) WRITTEN ON 05/17/2022  2:18 PM BY ESPINOZA, ALEJANDRA, DO  Poorly controlled diabetes for the last couple years. Discussed extensively that his pain is most likely related to progressive neuropathy from uncontrolled diabetes. Currently only on long-acting insulin and shared decision making to start mealtime insulin (he had the greatest control of sugars while incarcerated as he was on a sliding scale). Will start with 5 units of mealtime insulin once daily. Also given instructions to up-titrate his long-acting insulin for fasting sugars >130. -Amb ref ophthalmology -Lipid panel -Start mod-intensity statin (crestor 10 mg), may increase to high intensity pending ASCVD and lipid panel -Continue Basaglar 20 U, increase by 1 unit daily for fasting sugars > 130 -Start gabapentin 300 qHS, can increase to BID if tolerating well after the first week

## 2022-05-17 NOTE — Assessment & Plan Note (Signed)
BMI 17. Also likely related to uncontrolled diabetes and his abdominal pain/GI losses.  -Monitor; hopefully will increase as his diabetes is better controlled -Start Glucerna shakes TID for nutritional supplementation

## 2022-05-17 NOTE — Assessment & Plan Note (Signed)
Has had CT imaging without acute findings. Given his poorly controlled diabetes and neuropathy, suspect he may have gastroparesis. He was referred to GI previously, I provided him with contact information to schedule an appointment.  Would likely benefit from further work-up such as gastric emptying studies.

## 2022-05-17 NOTE — Assessment & Plan Note (Addendum)
His description of pain sounds neuropathic. He has had uncontrolled diabetes for the last 2 years.  We extensively discussed the complications that can, uncontrolled diabetes including worsening of his neuropathy.  At this point he seems more motivated to control his sugars. Diabetic foot examination today with decreased sensation to monofilament -Start low-dose Gabapentin 300 mg qHS; if tolerating well x1 week can increase to BID -Educated patient to do foot examinations at home

## 2022-05-17 NOTE — Patient Instructions (Addendum)
It was wonderful to see you today.  Today we talked about:  -I am starting you on rapid acting insulin. Take 5 units once a day with your biggest meal. -Check your fasting sugars. If your fasting sugar is >130, increase your Basaglar by 1 unit (example, instead of 20 units take 21 units) -I am starting you on a medication to help your neve pain. It is called gabapentin. Take it at night. If you are doing well with it, after a week you can increase to twice a day.  -I have sent a referral to the ophthalmologist for a diabetic eye examination. -Contact the GI office below to schedule an appointment Address: [address], [place], [place] [address] Phone: [phone]  Therapy and Counseling Resources Most providers on this list will take Medicaid. Patients with commercial insurance or Medicare should contact their insurance company to get a list of in network providers.  [employer] [employer] (takes children) Location 1: [address] [place], [place] [address] Location 2: [place], [place] [address] [place] ([place] speaking therapist available)(habla espanol)(take medicare and medicaid)  [place], [place], [place] [address], [place] [email or web address] [phone]  [facility]:Psychiatry and Counseling [address] [place], [place] [address] [place], [place], [place] [address]      [phone]  [place] (spanish available) [place], [place] [address] [place] (take [facility] and medicare) [address] [place], [place] [address]       [phone]     [place] (virtual only) [phone]  Jinny Blossom [facility] [address] [place], [place]  Family Solutions:  [place]. [place] [phone]  Journeys  Counseling:  [place] [address] Fate [phone]  [facility] (under & uninsured) [address] [place] [phone]    [email or web address]    [place] [address]  [place]    [phone]  [facility] [place]     Phone:  [phone]     [place] Marissa  [place] [address]      [place], [place] ext [place]: [place], [place], [place]   [place] ([place] therapist) [email or web address]  [place] [address]   [place] [address]    [phone]    [facility]   [address],  [place], [place]   [place]  [place]  [facility]  [address] [place], [place]        [phone]  Open Access/Walk In Clinic under & uninsured  [facility]  [address] [place], [place] [facility] [phone]  [facility] the [place],  ([place])   [place], [place]: [phone]) 8:30 - 12; 1 - 2:30  [facility] the [place],  [place], [place]    [phone]):8:30 - 12; 2 - 3PM  [facility] [employer],  [address]  [place]; [phone]):   Mon - Fri 8 AM -  5 PM  [facility] [place]  MWF 12:30 to 3:00 or call to schedule an appointment  [phone]  Specific Provider options Psychology Today  [email or web address] click on find a therapist  enter your zip code left side and select or tailor a therapist for your specific need.   [facility] Provider Directory [email or web address]  (Medicaid)   Follow all drop down to find a  provider  [place] or [email or web address] [address], [place], [place] Recovery support and educational   24- Hour Availability:   [facility]  [address] [place], [place] Crisis [phone]  [facility] the [employer] [phone]  [place]  [phone]   [place]  [phone] (after hours)  Therapeutic Alternative/Mobile Crisis   [phone]  [place] National Suicide Hotline  [phone] Diamantina Monks)  Call 911 or go to emergency room  [facility]  [phone]);  [place] and [employer]  [phone]); [place], [place], [place], [place], [place], [place], [place]   Thank you for choosing [facility].   Please call [phone] with any questions about today's appointment.  Please be sure to schedule follow up at the front  desk before you leave today.   Sharion Settler, DO PGY-3 Family Medicine

## 2022-05-17 NOTE — Telephone Encounter (Signed)
Patient now has [employer] and would like to go forward with getting the MRI done.  He is still having issues.  Will update provider to let her know and will update the team to schedule the imaging once I have been able to get the authorization from his insurance.  Ilyana Manuele,CMA

## 2022-05-18 LAB — LIPID PANEL
Chol/HDL Ratio: 3.2 ratio (ref 0.0–5.0)
Cholesterol, Total: 171 mg/dL (ref 100–199)
HDL: 53 mg/dL (ref >39)
LDL Chol Calc (NIH): 64 mg/dL (ref 0–99)
Triglycerides: 351 mg/dL — ABNORMAL HIGH (ref 0–149)
VLDL Cholesterol Cal: 54 mg/dL — ABNORMAL HIGH (ref 5–40)

## 2022-05-23 NOTE — Progress Notes (Deleted)
    SUBJECTIVE:   CHIEF COMPLAINT / HPI:   Matthew Lynch is a 52 y.o. 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  At that time he was started on 5 units of mealtime insulin and advised to continue Basaglar 20 units with instructions to increase by 1 unit daily for AM fasting sugar greater than 130. Now currently taking *** U.  Started on moderate intensity statin (lipid panel returned with LDL within goal, elevated triglycerides).  He was started on Neurontin 300 mg nightly with instructions to increase to twice daily if tolerating well.  He is currently taking***.  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 ***  General: NAD, pleasant, able to participate in exam Respiratory: Normal effort Abdomen: Thin Neuro: alert, no obvious focal deficits Psych: Normal affect and mood  ASSESSMENT/PLAN:   No problem-specific Assessment & Plan notes found for this encounter.     Sharion Settler, [place]

## 2022-05-24 ENCOUNTER — Ambulatory Visit: Payer: 59 | Admitting: Family Medicine

## 2022-05-24 ENCOUNTER — Telehealth: Payer: Self-pay

## 2022-05-24 NOTE — Telephone Encounter (Signed)
Called Megan at [facility]) and gave her the pre Auth#  [id number] .  Jinny Blossom states that she will call patient to screen and schedule appointment.  Ozella Almond, [place]

## 2022-05-26 ENCOUNTER — Encounter: Payer: Self-pay | Admitting: Internal Medicine

## 2022-05-27 ENCOUNTER — Ambulatory Visit: Payer: 59

## 2022-05-27 NOTE — Progress Notes (Deleted)
    SUBJECTIVE:   CHIEF COMPLAINT / HPI:   *** Abdominal Pain Seen by PCP for same on 05/17/22. Thought to be possible gastroparesis given poorly controlled diabetes and neuropathy. Recommended f/u with GI  Neuropathy -Gabapentin 300mg  qhs started at last visit 10 days ago  PERTINENT  PMH / PSH: ***  OBJECTIVE:   There were no vitals taken for this visit.  ***  ASSESSMENT/PLAN:   No problem-specific Assessment & Plan notes found for this encounter.     Alcus Dad, MD [address]

## 2022-06-05 ENCOUNTER — Inpatient Hospital Stay: Admission: RE | Admit: 2022-06-05 | Payer: 59 | Source: Ambulatory Visit

## 2022-06-22 ENCOUNTER — Ambulatory Visit
Admission: RE | Admit: 2022-06-22 | Discharge: 2022-06-22 | Disposition: A | Discharge status: Home / Self Care | Payer: 59 | Source: Ambulatory Visit | Attending: Family Medicine | Admitting: Family Medicine

## 2022-06-22 DIAGNOSIS — R32 Unspecified urinary incontinence: Secondary | ICD-10-CM

## 2022-06-22 DIAGNOSIS — M47812 Spondylosis without myelopathy or radiculopathy, cervical region: Secondary | ICD-10-CM | POA: Diagnosis not present

## 2022-06-22 DIAGNOSIS — R159 Full incontinence of feces: Secondary | ICD-10-CM

## 2022-06-22 DIAGNOSIS — M542 Cervicalgia: Secondary | ICD-10-CM | POA: Diagnosis not present

## 2022-06-22 DIAGNOSIS — R200 Anesthesia of skin: Secondary | ICD-10-CM | POA: Diagnosis not present

## 2022-06-22 DIAGNOSIS — M4802 Spinal stenosis, cervical region: Secondary | ICD-10-CM | POA: Diagnosis not present

## 2022-06-28 ENCOUNTER — Telehealth: Payer: Self-pay | Admitting: Family Medicine

## 2022-06-28 ENCOUNTER — Encounter: Payer: Self-pay | Admitting: Family Medicine

## 2022-06-28 NOTE — Telephone Encounter (Signed)
Called patient regarding MRI Cervical spine results. Call did not go through. Could not leave voice message. MRI results sent to PCP as well. Will send certified letter discussing findings.

## 2022-06-30 ENCOUNTER — Encounter: Payer: Self-pay | Admitting: Family Medicine

## 2022-07-01 ENCOUNTER — Ambulatory Visit: Payer: 59 | Admitting: Internal Medicine

## 2022-07-14 ENCOUNTER — Encounter: Payer: Self-pay | Admitting: Internal Medicine

## 2022-07-14 ENCOUNTER — Other Ambulatory Visit: Payer: 59

## 2022-07-14 ENCOUNTER — Ambulatory Visit (INDEPENDENT_AMBULATORY_CARE_PROVIDER_SITE_OTHER): Payer: 59 | Admitting: Internal Medicine

## 2022-07-14 VITALS — BP 110/80 | HR 96 | Ht 65.0 in | Wt 101.0 lb

## 2022-07-14 DIAGNOSIS — R109 Unspecified abdominal pain: Secondary | ICD-10-CM | POA: Diagnosis not present

## 2022-07-14 DIAGNOSIS — R634 Abnormal weight loss: Secondary | ICD-10-CM

## 2022-07-14 DIAGNOSIS — R197 Diarrhea, unspecified: Secondary | ICD-10-CM

## 2022-07-14 MED ORDER — METRONIDAZOLE 250 MG PO TABS: 250.0000 mg | ORAL_TABLET | Freq: Four times a day (QID) | ORAL | 0 refills | Status: DC

## 2022-07-14 MED ORDER — PLENVU 140 G PO SOLR: 1.0000 | Freq: Once | ORAL | 0 refills | Status: AC

## 2022-07-14 NOTE — Patient Instructions (Addendum)
_______________________________________________________  If you are age 49 or older, your body mass index should be between 23-30. Your Body mass index is 16.81 kg/m. If this is out of the aforementioned range listed, please consider follow up with your Primary Care Provider.  If you are age 32 or younger, your body mass index should be between 19-25. Your Body mass index is 16.81 kg/m. If this is out of the aformentioned range listed, please consider follow up with your Primary Care Provider.   ________________________________________________________  The [facility] providers would like to encourage you to use [facility] to communicate with providers for non-urgent requests or questions.  Due to long hold times on the telephone, sending your provider a message by [facility] may be a faster and more efficient way to get a response.  Please allow 48 business hours for a response.  Please remember that this is for non-urgent requests.  _______________________________________________________   Your provider has requested that you go to the basement level for lab work before leaving today. Press "B" on the elevator. The lab is located at the first door on the left as you exit the elevator.  We have sent the following medications to your pharmacy for you to pick up at your convenience:  Flagyl  You may take Imodium up to four times a day  You have been scheduled for an endoscopy and colonoscopy. Please follow the written instructions given to you at your visit today. Please pick up your prep supplies at the pharmacy within the next 1-3 days. If you use inhalers (even only as needed), please bring them with you on the day of your procedure.

## 2022-07-14 NOTE — Progress Notes (Signed)
HISTORY OF PRESENT ILLNESS:  Matthew Lynch is a 51 y.o. male, single father of 49, and current smoker, with a history of anxiety/depression and poorly controlled type 2 diabetes mellitus who was sent today by the family practice center regarding complaints of abdominal pain and chronic diarrhea.  The patient reports being diagnosed with diabetes approximately 2 and half years ago.  His medication list states that he is on metformin.  He states that he is not on metformin.  Review of blood work from April 23, 2022 shows a hemoglobin A1c of 13.7.  Comprehensive metabolic panel on that day revealed a blood glucose level of 384.  ALT was 96.  Other liver test normal.  CBC revealed a hemoglobin of 15.5.  Patient tells me that he has had diarrhea over the past 4 to 5 months.  Very loose.  Often has incontinent episodes.  Can be incontinent at night.  Tells me that he has not had work-up.  He describes his stools as looking oatmeal consistency.  He does not describe bleeding or stearrhea.  He tells me his baseline weight is about 126 pounds.  Currently 101 pounds.  He had been evaluated in the past for abdominal pain with nausea and diarrhea for which she underwent CT scan Jan 16, 2020.  The exam was negative.  More recently, June 2023, he underwent CT scan elsewhere.  Again unremarkable.  Actually stated that he had increased colonic stool.  He complains of increased intestinal gas.  Currently not working.  Denies alcohol use.  REVIEW OF SYSTEMS:  All non-GI ROS negative unless otherwise stated in the HPI except for anxiety, depression, back pain, visual change, confusion, fatigue, headaches, night sweats, shortness of breath, sleeping problems, ankle edema, excessive thirst, excessive urination, urinary frequency, urinary pain, urinary leakage  Past Medical History:  Diagnosis Date   Anxiety    Depression    Diabetes mellitus without complication ([place])     History reviewed. No pertinent surgical  history.  Social History Matthew Lynch  reports that he has been smoking cigarettes. He has been smoking an average of 1 pack per day. He has never used smokeless tobacco. He reports that he does not drink alcohol and does not use drugs.  family history includes Colon cancer in his maternal grandfather; Diabetes in his maternal great-grandmother and sister.  No Known Allergies     PHYSICAL EXAMINATION: Vital signs: BP 110/80   Pulse 96   Ht _0  (1.651 m)   Wt 101 lb (45.8 kg)   BMI 16.81 kg/m   Constitutional: Very thin but otherwise generally well-appearing, no acute distress Psychiatric: alert and oriented x3, cooperative Eyes: extraocular movements intact, anicteric, conjunctiva pink Mouth: oral pharynx moist, no lesions Neck: supple no lymphadenopathy Cardiovascular: heart regular rate and rhythm, no murmur Lungs: clear to auscultation bilaterally Abdomen: soft, complains of tenderness with minimal palpation, nondistended, no obvious ascites, no peritoneal signs, normal bowel sounds, no organomegaly Rectal: Deferred until colonoscopy Extremities: no clubbing, cyanosis, or lower extremity edema bilaterally Skin: no lesions on visible extremities Neuro: No focal deficits.  Cranial nerves intact  ASSESSMENT:  1.  Chronic abdominal pain.  Vague.  Negative previous evaluations. 2.  Chronic diarrhea with incontinence.  Rule out infection.  Rule out diabetic related.  Rule out bacterial overgrowth.  Rule out colitis, microscopic or otherwise. 3.  Poorly controlled diabetes mellitus 4.  Weight loss likely due to #3 above and possibly #2 above   PLAN:  1.  Stool  studies for enteric pathogens and C. difficile. 2.  Imodium as needed for diarrhea. 3.  Prescribe metronidazole 250 mg 4 times daily for 10 days 4.  Schedule colonoscopy with biopsies to provide colon cancer screening as well as to evaluate diarrhea and abdominal pain. 5.  Schedule upper endoscopy with biopsies to  evaluate abdominal pain and diarrhea. 6.  Additional recommendations after the above completed Total time of 60 minutes was spent preparing to see the patient, reviewing myriad of outside records and data, obtaining comprehensive history, performing medically appropriate physical examination, counseling and educating the patient regarding the above listed issues, ordering laboratories, ordering medication, ordering endoscopic procedures, and documenting clinical information in the health record

## 2022-07-16 LAB — CLOSTRIDIUM DIFFICILE BY PCR: Toxigenic C. Difficile by PCR: NEGATIVE

## 2022-07-19 LAB — GI PROFILE, STOOL, PCR

## 2022-07-19 NOTE — Progress Notes (Signed)
Spoke with pts mother and she is aware of results per [name].

## 2022-07-26 ENCOUNTER — Ambulatory Visit (INDEPENDENT_AMBULATORY_CARE_PROVIDER_SITE_OTHER): Payer: 59 | Admitting: Family Medicine

## 2022-07-26 ENCOUNTER — Encounter: Payer: Self-pay | Admitting: Family Medicine

## 2022-07-26 VITALS — BP 112/81 | HR 99 | Wt 109.0 lb

## 2022-07-26 DIAGNOSIS — R4589 Other symptoms and signs involving emotional state: Secondary | ICD-10-CM | POA: Diagnosis not present

## 2022-07-26 DIAGNOSIS — R159 Full incontinence of feces: Secondary | ICD-10-CM

## 2022-07-26 DIAGNOSIS — E1165 Type 2 diabetes mellitus with hyperglycemia: Secondary | ICD-10-CM

## 2022-07-26 DIAGNOSIS — R32 Unspecified urinary incontinence: Secondary | ICD-10-CM

## 2022-07-26 DIAGNOSIS — R69 Illness, unspecified: Secondary | ICD-10-CM | POA: Diagnosis not present

## 2022-07-26 DIAGNOSIS — Z794 Long term (current) use of insulin: Secondary | ICD-10-CM

## 2022-07-26 LAB — POCT GLYCOSYLATED HEMOGLOBIN (HGB A1C): HbA1c, POC (controlled diabetic range): 11.3 % — AB (ref 0.0–7.0)

## 2022-07-26 MED ORDER — TRUE METRIX BLOOD GLUCOSE TEST VI STRP: ORAL_STRIP | 0 refills | Status: DC

## 2022-07-26 MED ORDER — BASAGLAR KWIKPEN 100 UNIT/ML ~~LOC~~ SOPN: 30.0000 [IU] | PEN_INJECTOR | SUBCUTANEOUS | 1 refills | Status: DC

## 2022-07-26 MED ORDER — DULOXETINE HCL 30 MG PO CPEP: 30.0000 mg | ORAL_CAPSULE | Freq: Every day | ORAL | 1 refills | Status: DC

## 2022-07-26 NOTE — Assessment & Plan Note (Signed)
Chronic and ongoing.  He is scheduled to have an upper endoscopy and colonoscopy by GI in a couple months.  He previously had an MRI of his lumbar spine which did not show evidence of cord compression or severe spinal stenosis.  He has been taking Jardiance and with his A1c being uncontrolled, this is likely not helping his urinary symptoms.  I have asked him to hold his Jardiance for the time being.

## 2022-07-26 NOTE — Progress Notes (Signed)
SUBJECTIVE:   CHIEF COMPLAINT / HPI:   Matthew Lynch is a 51 y.o. male who presents to the [facility] clinic today to discuss the following concerns:   T2DM  Diabetes, Type 2 - Last A1c 13.7 in August - Medications: Taking 22 units of Basaglar. Takes 5U of insulin aspart if he takes a big meal- maybe once every other day. - Compliance: Takes most days - Checking BG at home: Checks about once a day. Fasting sugars are around 300s - Statin: Crestor 10 mg - Was prescribed Gabapentin 300 mg qHS for neuropathy but stopped taking it because he felt no relief and he states that it made him sleepy.   Per chart review, he was last seen by GI on 11/15 for his chronic abdominal pain and diarrhea with incontinence.  He was recommended for stool studies and given a prescription for metronidazole 250 mg 4 times daily for 10 days.  He is scheduled to have a colonoscopy and upper endoscopy with biopsies for further evaluation.   MR LUMBAR SPINE IMPRESSION 01/2022 1. No acute abnormality within the lumbar spine. No evidence for  cord compression or severe spinal stenosis.  2. Degenerative disc disease with reactive endplate spurring at  075-GRM with resultant mild bilateral L5 foraminal stenosis.  3. Mild disc bulging with small left subarticular disc protrusion at  L2-3 with resultant mild left lateral recess and left L2 foraminal  stenosis.   PERTINENT  PMH / PSH: Bowel and bladder incontinence   OBJECTIVE:   BP 112/81   Pulse 99   Wt 109 lb (49.4 kg)   SpO2 100%   BMI 18.14 kg/m    General: NAD, pleasant, able to participate in exam Respiratory: normal effort Abdomen: Thin Neuro: alert, no obvious focal deficits. Antalgic gait. Able to sit and stand independently  Psych: Depressed affect and mood, intermittently tearful     07/26/2022    1:46 PM 05/17/2022    1:38 PM 07/07/2021    2:22 PM  Depression screen PHQ 2/9  Decreased Interest 3 3 3   Down, Depressed, Hopeless 3 3 3   PHQ - 2  Score 6 6 6   Altered sleeping 3 3 3   Tired, decreased energy 3 3 3   Change in appetite 3 3 3   Feeling bad or failure about yourself  3 3 3   Trouble concentrating 3 3 3   Moving slowly or fidgety/restless 3 3 3   Suicidal thoughts 3 1 0  PHQ-9 Score 27 25 24    ASSESSMENT/PLAN:   Bowel and bladder incontinence Chronic and ongoing.  He is scheduled to have an upper endoscopy and colonoscopy by GI in a couple months.  He previously had an MRI of his lumbar spine which did not show evidence of cord compression or severe spinal stenosis.  He has been taking Jardiance and with his A1c being uncontrolled, this is likely not helping his urinary symptoms.  I have asked him to hold his Jardiance for the time being. 1. Type 2 diabetes mellitus with hyperglycemia, with long-term current use of insulin (HCC) Repeat hemoglobin A1c slightly improved today at 11.3.  He continues to have uncontrolled fasting sugars and would benefit from insulin up titration.  Continues to have neuropathy but did not tolerate gabapentin as it made him drowsy and he felt no relief with 300 mg. Would not increase dose given drowsiness. Amenable to close follow up with Pharmacy team for insulin uptitration.  - Repeat HgB A1c in 3 months - glucose blood (  TRUE METRIX BLOOD GLUCOSE TEST) test strip; Use as instructed  Dispense: 100 each; Refill: 0 - Insulin Glargine (BASAGLAR KWIKPEN) 100 UNIT/ML; Inject 30 Units into the skin every morning.  Dispense: 15 mL; Refill: 1 - Continue Insulin Aspart 5U with dinner daily - DULoxetine (CYMBALTA) 30 MG capsule; Take 1 capsule (30 mg total) by mouth daily.  Dispense: 30 capsule; Refill: 1 -STOP Jardiance, until A1c is better controlled. This is likely not helping with his urinary symptoms. -Follow-up scheduled with pharmacy team in 2 weeks  2. Depressed mood Elevated PHQ-9 with a positive question for #9.  Though he has suicidal ideations, he has no plan. His suicidal ideations are driven  from his pain from his neuropathy and his urinary and fecal incontinence issues. He is NOT interested in any therapy/counseling.  - DULoxetine (CYMBALTA) 30 MG capsule; Take 1 capsule (30 mg total) by mouth daily.  Dispense: 30 capsule; Refill: 1  3.  Bowel and bladder incontinence Chronic and ongoing.  He is scheduled to have an upper endoscopy and colonoscopy by GI in a couple months.  He previously had an MRI of his lumbar spine which did not show evidence of cord compression or severe spinal stenosis.  He has been taking Jardiance and with his A1c being uncontrolled, this is likely not helping his urinary symptoms.  I have asked him to hold his Jardiance for the time being.    Sabino Dick, DO  [facility]

## 2022-07-26 NOTE — Patient Instructions (Addendum)
It was wonderful to see you today.  Please bring ALL of your medications with you to every visit.   Today we talked about:  We are increasing your Basaglar to 30 units daily.  Continue to increase your units by 1 unit daily if your fasting sugar is >130. Take your fasting insulin 5U with dinner daily. I have refilled your test strips.  I am starting you on a different medication to hopefully help with your nerve pain.   Thank you for coming to your visit as scheduled. We have had a large "no-show" problem lately, and this significantly limits our ability to see and care for patients. As a friendly reminder- if you cannot make your appointment please call to cancel. We do have a no show policy for those who do not cancel within 24 hours. Our policy is that if you miss or fail to cancel an appointment within 24 hours, 3 times in a 64-month period, you may be dismissed from our clinic.   Thank you for choosing [facility].   Please call [phone] with any questions about today's appointment.  Please be sure to schedule follow up at the front  desk before you leave today.   Sabino Dick, DO PGY-3 Family Medicine

## 2022-08-03 ENCOUNTER — Telehealth: Payer: Self-pay | Admitting: Family Medicine

## 2022-08-03 ENCOUNTER — Other Ambulatory Visit: Payer: Self-pay | Admitting: Family Medicine

## 2022-08-03 DIAGNOSIS — R159 Full incontinence of feces: Secondary | ICD-10-CM

## 2022-08-03 NOTE — Telephone Encounter (Signed)
Called Matthew Lynch at the number listed in chart: [phone]. His mother Matthew Lynch who is listed as his contact answered the phone. She shared that Matthew Lynch is currently incarcerated, she is not sure how much time he will be serving. She was able to confirm his DOB and was aware that had spinal imaging by MRI. I discussed that I was wanting to place a referral to follow up on the findings from the imaging. She advised me to place the referral and that she would discuss it with him when he calls her later this evening. She recommended that we keep his primary contact number [phone] the same.

## 2022-08-10 ENCOUNTER — Ambulatory Visit: Payer: 59 | Admitting: Pharmacist

## 2022-08-19 ENCOUNTER — Ambulatory Visit: Payer: 59 | Admitting: Family Medicine

## 2022-08-19 ENCOUNTER — Other Ambulatory Visit: Payer: Self-pay | Admitting: Family Medicine

## 2022-08-19 DIAGNOSIS — R159 Full incontinence of feces: Secondary | ICD-10-CM

## 2022-08-19 DIAGNOSIS — M4802 Spinal stenosis, cervical region: Secondary | ICD-10-CM

## 2022-08-19 NOTE — Patient Instructions (Incomplete)
It was wonderful to see you today.  Please bring ALL of your medications with you to every visit.   Today we talked about:  We are doing lab work today to check your kidney function and check your triglycerides. I will send you a MyChart message if you have MyChart. Otherwise, I will give you a call for abnormal results or send a letter if everything returned back normal. If you don't hear from me in 2 weeks, please call the office.    I have referred you to Neurosurgery for further evaluation and possible intervention for your spine.  Please call [facility] at [phone] to schedule an appointment.  Thank you for coming to your visit as scheduled. We have had a large "no-show" problem lately, and this significantly limits our ability to see and care for patients. As a friendly reminder- if you cannot make your appointment please call to cancel. We do have a no show policy for those who do not cancel within 24 hours. Our policy is that if you miss or fail to cancel an appointment within 24 hours, 3 times in a 60-month period, you may be dismissed from our clinic.   Thank you for choosing [facility].   Please call [phone] with any questions about today's appointment.  Please be sure to schedule follow up at the front  desk before you leave today.   Sabino Dick, DO PGY-3 Family Medicine

## 2022-08-19 NOTE — Progress Notes (Deleted)
    SUBJECTIVE:   CHIEF COMPLAINT / HPI:   Matthew Lynch is a 51 y.o. male who [facility] today to discuss the following concerns:   Diabetes, Type 2 - Last A1c 11.3 on 11/27 - Medications: Taking Insulin glargine 30 units daily, insulin aspart 5 units with dinner  *** - Compliance: *** - Checking BG at home: *** - Diet: *** - Exercise: *** - Eye exam: *** - Foot exam: *** - Microalbumin: *** - Statin: *** - Neuropathy: Switched to cymbalta 30 mg daily at last visit (11/27) since he did not tolerate low dose of Gabapentin 2/2 adverse effect of drowsiness.    Cervical MRI from 10/23 with notable multilevel cervical spondylosis, greatest at C4-C5 with moderate sized right paracentral disc protrusion. Referral to Neurosurgery placed for further evaluation and possible intervention given his ongoing bowel/bladder incontinence as well as chronic pain.   PERTINENT  PMH / PSH: Bowel and bladder incontinence   OBJECTIVE:   There were no vitals taken for this visit. ***  General: NAD, pleasant, able to participate in exam Cardiac: RRR, no murmurs. Respiratory: CTAB, normal effort, No wheezes, rales or rhonchi Abdomen: Bowel sounds present, nontender, nondistended, no hepatosplenomegaly. Extremities: no edema or cyanosis. Skin: warm and dry, no rashes noted Neuro: alert, no obvious focal deficits Psych: Normal affect and mood  ASSESSMENT/PLAN:   No problem-specific Assessment & Plan notes found for this encounter.  Not a good candidate for SGLT2 given urinary incontinence and uncontrolled diabetes.  Not a great candidate for GLP-1 given he is underweight and already has poor appetite.  Next A1c due in February.    Matthew Manheim, DO [facility]  

## 2022-08-24 ENCOUNTER — Telehealth: Payer: Self-pay

## 2022-08-24 NOTE — Telephone Encounter (Signed)
New instructions for new date and time for procedure mailed to patient.

## 2022-08-31 NOTE — Progress Notes (Deleted)
    SUBJECTIVE:   CHIEF COMPLAINT / HPI:   Matthew Lynch is a 52 y.o. male who presents to the [facility] clinic today to discuss the following concerns:   Diabetes, Type 2 - Last A1c 11.3 on 11/27 - Medications: Taking Insulin glargine 30 units daily, insulin aspart 5 units with dinner  *** - Compliance: *** - Checking BG at home: *** - Diet: *** - Exercise: *** - Eye exam: *** - Foot exam: *** - Microalbumin: *** - Statin: *** - Neuropathy: Switched to cymbalta 30 mg daily at last visit (11/27) since he did not tolerate low dose of Gabapentin 2/2 adverse effect of drowsiness.    Cervical MRI from 10/23 with notable multilevel cervical spondylosis, greatest at C4-C5 with moderate sized right paracentral disc protrusion. Referral to Neurosurgery placed for further evaluation and possible intervention given his ongoing bowel/bladder incontinence as well as chronic pain.   PERTINENT  PMH / PSH: Bowel and bladder incontinence   OBJECTIVE:   There were no vitals taken for this visit. ***  General: NAD, pleasant, able to participate in exam Cardiac: RRR, no murmurs. Respiratory: CTAB, normal effort, No wheezes, rales or rhonchi Abdomen: Bowel sounds present, nontender, nondistended, no hepatosplenomegaly. Extremities: no edema or cyanosis. Skin: warm and dry, no rashes noted Neuro: alert, no obvious focal deficits Psych: Normal affect and mood  ASSESSMENT/PLAN:   No problem-specific Assessment & Plan notes found for this encounter.  Not a good candidate for SGLT2 given urinary incontinence and uncontrolled diabetes.  Not a great candidate for GLP-1 given he is underweight and already has poor appetite.  Next A1c due in February.    Sharion Settler, [place]

## 2022-09-01 ENCOUNTER — Ambulatory Visit: Payer: 59 | Admitting: Family Medicine

## 2022-09-01 DIAGNOSIS — M4802 Spinal stenosis, cervical region: Secondary | ICD-10-CM | POA: Diagnosis not present

## 2022-09-07 ENCOUNTER — Encounter: Payer: Self-pay | Admitting: Family Medicine

## 2022-09-07 ENCOUNTER — Other Ambulatory Visit: Payer: Self-pay | Admitting: Family Medicine

## 2022-09-07 ENCOUNTER — Ambulatory Visit (INDEPENDENT_AMBULATORY_CARE_PROVIDER_SITE_OTHER): Payer: 59 | Admitting: Family Medicine

## 2022-09-07 VITALS — BP 102/62 | HR 98 | Wt 107.0 lb

## 2022-09-07 DIAGNOSIS — R69 Illness, unspecified: Secondary | ICD-10-CM | POA: Diagnosis not present

## 2022-09-07 DIAGNOSIS — Z794 Long term (current) use of insulin: Secondary | ICD-10-CM | POA: Diagnosis not present

## 2022-09-07 DIAGNOSIS — Z659 Problem related to unspecified psychosocial circumstances: Secondary | ICD-10-CM

## 2022-09-07 DIAGNOSIS — E1165 Type 2 diabetes mellitus with hyperglycemia: Secondary | ICD-10-CM | POA: Diagnosis not present

## 2022-09-07 DIAGNOSIS — F339 Major depressive disorder, recurrent, unspecified: Secondary | ICD-10-CM

## 2022-09-07 DIAGNOSIS — Z609 Problem related to social environment, unspecified: Secondary | ICD-10-CM

## 2022-09-07 DIAGNOSIS — M545 Low back pain, unspecified: Secondary | ICD-10-CM | POA: Diagnosis not present

## 2022-09-07 DIAGNOSIS — G8929 Other chronic pain: Secondary | ICD-10-CM | POA: Diagnosis not present

## 2022-09-07 MED ORDER — FIASP FLEXTOUCH 100 UNIT/ML ~~LOC~~ SOPN: PEN_INJECTOR | SUBCUTANEOUS | 1 refills | Status: DC

## 2022-09-07 MED ORDER — ONETOUCH DELICA LANCETS 33G MISC: 0 refills | Status: DC

## 2022-09-07 MED ORDER — INSULIN DEGLUDEC 100 UNIT/ML ~~LOC~~ SOLN: 100.0000 [IU] | Freq: Every day | SUBCUTANEOUS | 1 refills | Status: DC

## 2022-09-07 MED ORDER — INSULIN ASPART (W/NIACINAMIDE) 100 UNIT/ML ~~LOC~~ SOPN: PEN_INJECTOR | SUBCUTANEOUS | 0 refills | Status: DC

## 2022-09-07 MED ORDER — ROSUVASTATIN CALCIUM 10 MG PO TABS: 10.0000 mg | ORAL_TABLET | Freq: Every day | ORAL | 3 refills | Status: DC

## 2022-09-07 MED ORDER — BASAGLAR KWIKPEN 100 UNIT/ML ~~LOC~~ SOPN: 30.0000 [IU] | PEN_INJECTOR | SUBCUTANEOUS | 1 refills | Status: DC

## 2022-09-07 NOTE — Patient Instructions (Addendum)
It was wonderful to see you today.  Please bring ALL of your medications with you to every visit.   Today we talked about:  I have refilled your medications.  I have sent in lancets for you to check your sugars. It is important that you check your sugars so that we can know how to adjust your insulin. If your fasting sugar (sugar prior to eating anything) is >130, increase your insulin by 2 units. Continue this until you have a fasting sugar <130 or until you reach 50U (I do not want you going above this at this time).   Continue the rapid acting insulin (5 units) at dinner. You are due for a repeat A1c next month. I will see you then.   Thank you for coming to your visit as scheduled. We have had a large "no-show" problem lately, and this significantly limits our ability to see and care for patients. As a friendly reminder- if you cannot make your appointment please call to cancel. We do have a no show policy for those who do not cancel within 24 hours. Our policy is that if you miss or fail to cancel an appointment within 24 hours, 3 times in a 64-month period, you may be dismissed from our clinic.   Thank you for choosing [facility].   Please call [phone] with any questions about today's appointment.  Please be sure to schedule follow up at the front  desk before you leave today.   Sharion Settler, DO PGY-3 Family Medicine

## 2022-09-07 NOTE — Progress Notes (Signed)
SUBJECTIVE:   CHIEF COMPLAINT / HPI:   Matthew Lynch is a 52 y.o. male who presents to the [facility] clinic today to discuss the following concerns:   Uncontrolled Type 2 diabetes with neuropathy Since our last visit, unfortunately Matthew Lynch has been in and out of jail. He reports that the "diabetes tray" at the jail consisted mostly of white rice and "7-slices of white bread". He reports that the insulin he was taking in the jail was not helping his sugars. He was released from jail on the 22nd of December but did have to go back a few times which is why he missed his last two appointments with me. He has not been checking his sugars since he has run out of lancets.  - Last A1c 11.3 on 11/27 - Medications: Taking Insulin glargine "24-26, but mostly 24 units" daily, along with insulin aspart 5 units with dinner if he eats a big meal - Compliance: Good since being released from jail - Checking BG at home: Has not been  - Eye exam: Scheduled in February  - Foot exam: Due - Microalbumin: Due - Statin: Rosuvastatin 10 mg  - Neuropathy: Switched to cymbalta 30 mg daily at last visit (11/27) since he did not tolerate low dose of Gabapentin 2/2 adverse effect of drowsiness. He reports some improvement but admits to not having taken it many times since he was incarcerated.   Cervical MRI from 10/23 with notable multilevel cervical spondylosis, greatest at C4-C5 with moderate sized right paracentral disc protrusion. Referral to Neurosurgery placed for further evaluation and possible intervention given his ongoing bowel/bladder incontinence as well as chronic pain.   Since being released, he reports he has seen Neurosurgery but was advised he is not a good surgical candidate due to his poorly controlled diabetes.  PERTINENT  PMH / PSH: Bowel and bladder incontinence   OBJECTIVE:   BP 102/62   Pulse 98   Wt 107 lb (48.5 kg)   SpO2 98%   BMI 17.81 kg/m    General: NAD, pleasant, able to  participate in exam Respiratory: normal effort Abdomen: Thin abdomen  Foot exam: No deformities, ulcerations, or other skin breakdown on feet bilaterally.  Sensation is mostly intact to monofilament and light touch.  PT and DP pulses intact BL.   Skin: warm and dry, no rashes noted Psych: Normal affect and mood     09/07/2022    2:19 PM 07/26/2022    1:46 PM 05/17/2022    1:38 PM  Depression screen PHQ 2/9  Decreased Interest 3 3 3   Down, Depressed, Hopeless 3 3 3   PHQ - 2 Score 6 6 6   Altered sleeping 3 3 3   Tired, decreased energy 3 3 3   Change in appetite 3 3 3   Feeling bad or failure about yourself  3 3 3   Trouble concentrating 3 3 3   Moving slowly or fidgety/restless 3 3 3   Suicidal thoughts 0 3 1  PHQ-9 Score 24 27 25      ASSESSMENT/PLAN:   1. Type 2 diabetes mellitus with hyperglycemia, with long-term current use of insulin (HCC) Due for A1c next month.  Provided him with samples of Fiasp in [place] today (did not have [employer]). He was provided with instructions on how to uptitrate his insulin.  Will need close follow-up with pharmacy myself to continue insulin titration. Question if he has T1DM instead of T2DM given his body habitus, BMI 17 (vs really chronically uncontrolled diabetes). Not a good  candidate for SGLT2 given urinary incontinence and uncontrolled diabetes.  Not a great candidate for GLP-1 given he is underweight and already has poor appetite. His neuropathy seems slightly improved with the Duloxetine. Encouraged him to take daily for best benefit.  Next A1c due in February.  - Microalbumin/Creatinine Ratio, Urine - Insulin Glargine (BASAGLAR KWIKPEN) 100 UNIT/ML; Inject 30 Units into the skin every morning.  Dispense: 15 mL; Refill: 1 - AMB Referral to Community Care Coordinaton (ACO Patients) - Continue Duloxetine - Refilled Lancets   2. Poor social situation In and out of jail recently. Does not have a consistent place to stay- has been staying with  different family members. Have placed referral to [facility] for assistance given his significant comorbidities and social barriers. - AMB Referral to [facility] Coordinaton (ACO Patients)  3. Chronic bilateral low back pain without sciatica Has already been evaluated by neurosurgery.  He is not a surgical candidate given his uncontrolled diabetes.  We discussed this and he seems motivated to get his diabetes under better control. - AMB Referral to [facility] Coordinaton (ACO Patients)  4. Depression, recurrent (HCC) On Duloxetine. Therapy has been brought up multiple times with him and he is not interested. Not suicidal.  - Continue Duloxetine - AMB Referral to Community Care Coordinaton (ACO Patients)       Sabino Dick, DO [facility]

## 2022-09-08 LAB — MICROALBUMIN / CREATININE URINE RATIO
Creatinine, Urine: 291.4 mg/dL
Microalb/Creat Ratio: 11 mg/g creat (ref 0–29)
Microalbumin, Urine: 31.3 ug/mL

## 2022-09-14 ENCOUNTER — Ambulatory Visit: Payer: Self-pay

## 2022-09-14 NOTE — Patient Outreach (Signed)
  Care Coordination   09/14/2022 Name: Matthew Lynch MRN: [id number] DOB: 1970/12/21   Care Coordination Outreach Attempts:  An unsuccessful telephone outreach was attempted today to offer the patient information about available care coordination services as a benefit of their health plan.   Follow Up Plan:  Additional outreach attempts will be made to offer the patient care coordination information and services.   Encounter Outcome:  No Answer   Care Coordination Interventions:  No, not indicated    Daneen Schick, BSW, CDP Social Worker, Certified Dementia Practitioner [facility]  Care Coordination [phone]

## 2022-09-16 ENCOUNTER — Ambulatory Visit: Payer: Self-pay

## 2022-09-16 NOTE — Patient Outreach (Signed)
  Care Coordination   09/16/2022 Name: Matthew Lynch MRN: [id number] DOB: Aug 21, 1971   Care Coordination Outreach Attempts:  A second unsuccessful outreach was attempted today to offer the patient with information about available care coordination services as a benefit of their health plan.     Follow Up Plan:  No further outreach attempts will be made at this time. We have been unable to contact the patient to offer or enroll patient in care coordination services. Patients number is invalid.  Encounter Outcome:  No Answer   Care Coordination Interventions:  No, not indicated    Daneen Schick, BSW, CDP Social Worker, Certified Dementia Practitioner [facility]  Care Coordination [phone]    ,

## 2022-09-21 ENCOUNTER — Encounter: Payer: Self-pay | Admitting: Pharmacist

## 2022-09-21 ENCOUNTER — Other Ambulatory Visit: Payer: Self-pay | Admitting: Family Medicine

## 2022-09-21 ENCOUNTER — Ambulatory Visit (INDEPENDENT_AMBULATORY_CARE_PROVIDER_SITE_OTHER): Payer: 59 | Admitting: Pharmacist

## 2022-09-21 VITALS — BP 111/84 | HR 119 | Wt 106.0 lb

## 2022-09-21 DIAGNOSIS — L84 Corns and callosities: Secondary | ICD-10-CM | POA: Diagnosis not present

## 2022-09-21 DIAGNOSIS — E1140 Type 2 diabetes mellitus with diabetic neuropathy, unspecified: Secondary | ICD-10-CM

## 2022-09-21 DIAGNOSIS — E1165 Type 2 diabetes mellitus with hyperglycemia: Secondary | ICD-10-CM

## 2022-09-21 DIAGNOSIS — Z794 Long term (current) use of insulin: Secondary | ICD-10-CM | POA: Diagnosis not present

## 2022-09-21 DIAGNOSIS — E785 Hyperlipidemia, unspecified: Secondary | ICD-10-CM

## 2022-09-21 MED ORDER — ACCU-CHEK SOFTCLIX LANCETS MISC: 12 refills | Status: DC

## 2022-09-21 MED ORDER — INSULIN DEGLUDEC 100 UNIT/ML ~~LOC~~ SOLN: 30.0000 [IU] | Freq: Every day | SUBCUTANEOUS | 1 refills | Status: DC

## 2022-09-21 MED ORDER — ACCU-CHEK GUIDE VI STRP: ORAL_STRIP | 12 refills | Status: DC

## 2022-09-21 MED ORDER — LANTUS SOLOSTAR 100 UNIT/ML ~~LOC~~ SOPN: 30.0000 [IU] | PEN_INJECTOR | Freq: Every day | SUBCUTANEOUS | 3 refills | Status: DC

## 2022-09-21 MED ORDER — INSULIN LISPRO (1 UNIT DIAL) 100 UNIT/ML (KWIKPEN): 6.0000 [IU] | PEN_INJECTOR | Freq: Two times a day (BID) | SUBCUTANEOUS | 3 refills | Status: DC

## 2022-09-21 MED ORDER — ACCU-CHEK GUIDE W/DEVICE KIT: PACK | 0 refills | Status: DC

## 2022-09-21 NOTE — Progress Notes (Signed)
S:     Chief Complaint  Patient presents with   Medication Management    Diabetes    52 y.o. male who presents for diabetes evaluation, education, and management.  PMH is significant for type 2 diabetes, depression/anxiety, hyperlipidemia, bowel and urinary incontinence.  Patient was referred and last seen by Primary Care Provider, Dr. Nita Sells, on 09/07/2022.  At last visit, Dr. Nita Sells initiated Tyler Aas (insulin degludec) 30 units daily because we were out of basaglar (insulin glargine) samples. Patient was also started on Fiasp (insulin aspart) 5 units daily with dinner. Patient reports only using Fiasp (insulin aspart) every other day.   Today, patient arrives in okay spirits and presents without any assistance. He gives his stress and anxiety at "10/10".   Patient reports diabetes was diagnosed in 2021.   Current diabetes medications include:duloxetine 30 mg daily, Fiasp (insulin aspart) 5 units with dinner, Tresiba (insulin degludec) 30 units daily. Current hyperlipidemia medications include: Rosuvastatin 10 mg daily.   Patient denies adherence with medications, reports missing rosuvastatin and Glucerna (feeding supplement) multiple times per week due to inconsistent insurance and expensive co-pays keeping him from picking up his medications. Patient also reports only taking his meal time insulin every other day.   Insurance coverage: working on [employer]   Reported home fasting blood sugars: 130 mg/dL  Patient reports nocturia (nighttime urination). Once every 30 minutes Patient reports neuropathy (nerve pain). Patient reports visual changes. Patient reports self foot exams.   Patient reported dietary habits: Eats 2 meals/day Breakfast: oatmeal, raisin bran, grits (eats breakfast almost everyday; patient's main meal)  Dinner: tacos, nachos  Drinks: water  O:   Review of Systems  Eyes:  Positive for blurred vision.  Genitourinary:  Positive for frequency.   Musculoskeletal:  Positive for back pain, falls (imbalance issues) and joint pain (foot pain).  Psychiatric/Behavioral:  Positive for depression. The patient is nervous/anxious.   All other systems reviewed and are negative.   Physical Exam Constitutional:      Appearance: Normal appearance.  Cardiovascular:     Rate and Rhythm: Tachycardia present.  Pulmonary:     Effort: Pulmonary effort is normal.     Breath sounds: Normal breath sounds.  Musculoskeletal:        General: Signs of injury (spinal injury (disk) that could be contributing to incontinence) present.  Neurological:     Mental Status: He is alert.  Psychiatric:        Mood and Affect: Mood normal.        Behavior: Behavior normal.        Thought Content: Thought content normal.        Judgment: Judgment normal.   Lateral Mid-Foot round lesion - assessed with Dr. Thompson Grayer.  Thought to be 10-12 mm callus.  Podiatry referral discussed with patient.  Dr. Thompson Grayer to order referral.   Lab Results  Component Value Date   HGBA1C 11.3 (A) 07/26/2022   Vitals:   09/21/22 1122  BP: 111/84  Pulse: (!) 119  SpO2: 100%    Lipid Panel     Component Value Date/Time   CHOL 171 05/17/2022 1526   TRIG 351 (H) 05/17/2022 1526   HDL 53 05/17/2022 1526   CHOLHDL 3.2 05/17/2022 1526   CHOLHDL 5.2 01/25/2020 1308   VLDL 54 (H) 01/25/2020 1308   LDLCALC 64 05/17/2022 1526    Clinical Atherosclerotic Cardiovascular Disease (ASCVD): No  The 10-year ASCVD risk score (Arnett DK, et al., 2019) is: 11.7%  Values used to calculate the score:     Age: 21 years     Sex: Male     Is Non-Hispanic African American: Yes     Diabetic: Yes     Tobacco smoker: Yes     Systolic Blood Pressure: 469 mmHg     Is BP treated: No     HDL Cholesterol: 53 mg/dL     Total Cholesterol: 171 mg/dL   A/P: Diabetes longstanding for 3 years and currently uncontrolled likely insulin requiring. Patient is able to verbalize appropriate hypoglycemia  management plan. Medication adherence appears poor. Control is suboptimal due to inability to afford medications and unstable home-life. He was recently released from jail from a child support dispute. In jail, patient reports only receiving meal time insulin and his only options for food were of high glucose content. The stress and poor diet autonomy have likely contributed to his blood sugar. At his daughter's home since being released, he has been consistent with his long acting insulin but reports only taking his short-acting insulin every other day. Patient did express some needle-phobia and that he is homeless and unemployed. Support seems to come from his mother and 28 year old daughter.  -Continued basal insulin Tresiba (insulin degludec) 30 units daily. -Increased dose of rapid insulin Fiasp (insulin aspart) to 6 units with breakfast and 6 units with dinner (or 8 units with dinner if blood sugar before meal is >150 mg/dL). -Continued Duloxetine (Cymbalta) 30 mg daily for diabetic neuropathy. -Continue to HOLD metformin due to inability to pay.  Readdress at next PCP appointment.   -Patient educated on purpose, proper use, and potential adverse effects. -Counseled on s/sx of and management of hypoglycemia.  -Next A1c anticipated at gastro appointment on 10/06/2022.   Unemployment and unstable home-life have contributed to the patient's inability to afford medications. Patient reports approval for Medicaid (received letter) but is awaiting Medicaid card. To prepare for his insurance coverage change, we sent the following medications to the patient's preferred pharmacy to fill once Medicaid card is attained. These will replace the current insulin and glucose monitoring regimen.  -Lantus (insulin glargine) 30 units daily -Humalog (insulin lispro) 6 units with breakfast and 6 units dinner (increase to 8 units with dinner if your blood sugar before meal is >150 mg/dL). -Accu-chek Guide glucose meter   -Accu-chek Guide test strips -Accu-chek Softclix Lancets   ASCVD risk - primary prevention in patient with diabetes. Last LDL is 64 and at goal of <70 mg/dL. ASCVD risk factors include diabetes, tobacco abuse, and hyperlipidemia. 10-year ASCVD risk score of 10.1%. moderate intensity statin indicated.  -Continued rosuvastatin 10 mg - patient to restart when he can afford medication.  -Medication is currently ready for pick-up at the pharmacy but co-pay is too expensive. Once patient has medicaid card he will be able to pick-up.   Callus of Foot -Podiatry referral for bilateral foot evaluation. Right mid-foot callus. -Order by Dr. Thompson Grayer.    Written patient instructions provided. Patient verbalized understanding of treatment plan.  Total time in face to face counseling 45 minutes.    Follow-up:  Pharmacist 2 weeks. Patient seen with Dixon Boos, PharmD Candidate. [name]

## 2022-09-21 NOTE — Progress Notes (Signed)
Reviewed: I agree with Dr. Koval's documentation and management. 

## 2022-09-21 NOTE — Assessment & Plan Note (Signed)
ASCVD risk - primary prevention in patient with diabetes. Last LDL is 64 and at goal of <70 mg/dL. ASCVD risk factors include diabetes, tobacco abuse, and hyperlipidemia. 10-year ASCVD risk score of 10.1%. moderate intensity statin indicated.  -Continued rosuvastatin 10 mg - patient to restart when he can afford medication.  -Medication is currently ready for pick-up at the pharmacy but co-pay is too expensive. Once patient has medicaid card he will be able to pick-up.

## 2022-09-21 NOTE — Assessment & Plan Note (Signed)
>>  ASSESSMENT AND PLAN FOR TYPE 2 DIABETES MELLITUS WITH HYPERGLYCEMIA (HCC) WRITTEN ON 09/21/2022  1:43 PM BY KOVAL, PETER G, RPH-CPP  Diabetes longstanding for 3 years and currently uncontrolled likely insulin requiring. Patient is able to verbalize appropriate hypoglycemia management plan. Medication adherence appears poor. Control is suboptimal due to inability to afford medications and unstable home-life. He was recently released from jail from a child support dispute. In jail, patient reports only receiving meal time insulin and his only options for food were of high glucose content. The stress and poor diet autonomy have likely contributed to his blood sugar. At his daughter's home since being released, he has been consistent with his long acting insulin but reports only taking his short-acting insulin every other day. Patient did express some needle-phobia and that he is homeless and unemployed. Support seems to come from his mother and 51 year old daughter.  -Continued basal insulin Tresiba (insulin degludec) 30 units daily. -Increased dose of rapid insulin Fiasp (insulin aspart) to 6 units with breakfast and 6 units with dinner (or 8 units with dinner if blood sugar before meal is >150 mg/dL). -Continued Duloxetine (Cymbalta) 30 mg daily for diabetic neuropathy. -Continue to HOLD metformin due to inability to pay.  Readdress at next PCP appointment.   -Patient educated on purpose, proper use, and potential adverse effects. -Counseled on s/sx of and management of hypoglycemia.  -Next A1c anticipated at gastro appointment on 10/06/2022.   Unemployment and unstable home-life have contributed to the patient's inability to afford medications. Patient reports approval for Medicaid (received letter) but is awaiting Medicaid card. To prepare for his insurance coverage change, we sent the following medications to the patient's preferred pharmacy to fill once Medicaid card is attained. These will replace the  current insulin and glucose monitoring regimen.  -Lantus (insulin glargine) 30 units daily -Humalog (insulin lispro) 6 units with breakfast and 6 units dinner (increase to 8 units with dinner if your blood sugar before meal is >150 mg/dL). -Accu-chek Guide glucose meter  -Accu-chek Guide test strips -Accu-chek Softclix Lancets

## 2022-09-21 NOTE — Progress Notes (Signed)
Seen with Dr Valentina Lucks. Patient has bilateral callus on lateral foot pad, right > left, tender to touch. No corn or skin breakdown, does appear a little darker on right foot like a bruise forming underneath, no signs of abscess, ulcer, or infection. Normal cap refill, normal 2+ dorsalis pedis pulses bilaterally. Referred to podiatry today for callus trimming as well possible inserts/orthotics for his diabetes.  Yehuda Savannah MD

## 2022-09-21 NOTE — Patient Instructions (Addendum)
It was nice to see you today.   Today we are giving you 1 pen of Tresiba (insulin degludec).  -Continue Tresiba (insulin degludec) 30 units once daily. -Increase Fiasp (insulin aspart) 6 units with breakfast and 6 units dinner (increase to 8 units with dinner if your blood sugar before meal is >150 mg/dL).  Once you get your Medicaid card in the mail, take it to your pharmacy and they will fill the following:  -Lantus (insulin glargine) 30 units daily -Humalog (insulin lispro) 6 units with breakfast and 6 units dinner (increase to 8 units with dinner if your blood sugar before meal is >150 mg/dL). -Accu-chek Guide glucose meter  -Accu-chek Guide test strips -Accu-chek Softclix Lancets

## 2022-09-21 NOTE — Assessment & Plan Note (Signed)
Diabetes longstanding for 3 years and currently uncontrolled likely insulin requiring. Patient is able to verbalize appropriate hypoglycemia management plan. Medication adherence appears poor. Control is suboptimal due to inability to afford medications and unstable home-life. He was recently released from jail from a child support dispute. In jail, patient reports only receiving meal time insulin and his only options for food were of high glucose content. The stress and poor diet autonomy have likely contributed to his blood sugar. At his daughter's home since being released, he has been consistent with his long acting insulin but reports only taking his short-acting insulin every other day. Patient did express some needle-phobia and that he is homeless and unemployed. Support seems to come from his mother and 49 year old daughter.  -Continued basal insulin Tresiba (insulin degludec) 30 units daily. -Increased dose of rapid insulin Fiasp (insulin aspart) to 6 units with breakfast and 6 units with dinner (or 8 units with dinner if blood sugar before meal is >150 mg/dL). -Continued Duloxetine (Cymbalta) 30 mg daily for diabetic neuropathy. -Continue to HOLD metformin due to inability to pay.  Readdress at next PCP appointment.   -Patient educated on purpose, proper use, and potential adverse effects. -Counseled on s/sx of and management of hypoglycemia.  -Next A1c anticipated at gastro appointment on 10/06/2022.   Unemployment and unstable home-life have contributed to the patient's inability to afford medications. Patient reports approval for Medicaid (received letter) but is awaiting Medicaid card. To prepare for his insurance coverage change, we sent the following medications to the patient's preferred pharmacy to fill once Medicaid card is attained. These will replace the current insulin and glucose monitoring regimen.  -Lantus (insulin glargine) 30 units daily -Humalog (insulin lispro) 6 units with  breakfast and 6 units dinner (increase to 8 units with dinner if your blood sugar before meal is >150 mg/dL). -Accu-chek Guide glucose meter  -Accu-chek Guide test strips -Accu-chek Softclix Lancets

## 2022-09-29 ENCOUNTER — Encounter: Payer: Self-pay | Admitting: Internal Medicine

## 2022-09-30 ENCOUNTER — Other Ambulatory Visit: Payer: Self-pay

## 2022-09-30 ENCOUNTER — Telehealth: Payer: Self-pay | Admitting: Internal Medicine

## 2022-09-30 DIAGNOSIS — M4802 Spinal stenosis, cervical region: Secondary | ICD-10-CM | POA: Diagnosis not present

## 2022-09-30 DIAGNOSIS — R6889 Other general symptoms and signs: Secondary | ICD-10-CM | POA: Diagnosis not present

## 2022-09-30 DIAGNOSIS — Z7409 Other reduced mobility: Secondary | ICD-10-CM | POA: Diagnosis not present

## 2022-09-30 MED ORDER — PLENVU 140 G PO SOLR: ORAL | 0 refills | Status: DC

## 2022-09-30 NOTE — Telephone Encounter (Signed)
Plenvu sent to pharmacy 

## 2022-09-30 NOTE — Telephone Encounter (Signed)
Patient needs prep medication sent into pharmacy

## 2022-10-06 ENCOUNTER — Ambulatory Visit (AMBULATORY_SURGERY_CENTER): Payer: 59 | Admitting: Internal Medicine

## 2022-10-06 ENCOUNTER — Encounter: Payer: Self-pay | Admitting: Internal Medicine

## 2022-10-06 VITALS — BP 101/75 | HR 98 | Temp 98.4°F | Resp 22 | Ht 65.0 in | Wt 101.0 lb

## 2022-10-06 DIAGNOSIS — Z1211 Encounter for screening for malignant neoplasm of colon: Secondary | ICD-10-CM

## 2022-10-06 DIAGNOSIS — E119 Type 2 diabetes mellitus without complications: Secondary | ICD-10-CM | POA: Diagnosis not present

## 2022-10-06 DIAGNOSIS — R634 Abnormal weight loss: Secondary | ICD-10-CM | POA: Diagnosis not present

## 2022-10-06 DIAGNOSIS — R197 Diarrhea, unspecified: Secondary | ICD-10-CM

## 2022-10-06 DIAGNOSIS — F32A Depression, unspecified: Secondary | ICD-10-CM | POA: Diagnosis not present

## 2022-10-06 DIAGNOSIS — R109 Unspecified abdominal pain: Secondary | ICD-10-CM | POA: Diagnosis not present

## 2022-10-06 DIAGNOSIS — F419 Anxiety disorder, unspecified: Secondary | ICD-10-CM | POA: Diagnosis not present

## 2022-10-06 MED ORDER — SODIUM CHLORIDE 0.9 % IV SOLN: 500.0000 mL | Freq: Once | INTRAVENOUS | Status: DC

## 2022-10-06 NOTE — Progress Notes (Signed)
Called to room to assist during endoscopic procedure.  Patient ID and intended procedure confirmed with present staff. Received instructions for my participation in the procedure from the performing physician.  

## 2022-10-06 NOTE — Op Note (Signed)
[place] Patient Name: Matthew Lynch Procedure Date: 10/06/2022 11:29 AM MRN: [id number] Endoscopist: Docia Chuck. Henrene Pastor , MD, [id number] Age: 52 Referring MD:  Date of Birth: 1971-07-19 Gender: Male Account #: [id number] Procedure:                Colonoscopy with biopsies Indications:              Colon cancer screening?"screening colonoscopy.                            Clinically significant diarrhea of unexplained                            origin, weight loss Medicines:                Monitored Anesthesia Care Procedure:                Pre-Anesthesia Assessment:                           - Prior to the procedure, a History and Physical                            was performed, and patient medications and                            allergies were reviewed. The patient's tolerance of                            previous anesthesia was also reviewed. The risks                            and benefits of the procedure and the sedation                            options and risks were discussed with the patient.                            All questions were answered, and informed consent                            was obtained. Prior Anticoagulants: The patient has                            taken no anticoagulant or antiplatelet agents. ASA                            Grade Assessment: II - A patient with mild systemic                            disease. After reviewing the risks and benefits,                            the patient was deemed in satisfactory condition to  undergo the procedure.                           After obtaining informed consent, the colonoscope                            was passed under direct vision. Throughout the                            procedure, the patient's blood pressure, pulse, and                            oxygen saturations were monitored continuously. The                            Olympus CF-[id number] SN [id number]  was introduced through                            the anus and advanced to the the cecum, identified                            by appendiceal orifice and ileocecal valve. The                            terminal ileum, ileocecal valve, appendiceal                            orifice, and rectum were photographed. The quality                            of the bowel preparation was excellent. The                            colonoscopy was performed without difficulty. The                            patient tolerated the procedure well. The bowel                            preparation used was SUPREP via split dose                            instruction. Scope In: 11:45:15 AM Scope Out: 11:57:35 AM Scope Withdrawal Time: 0 hours 7 minutes 17 seconds  Total Procedure Duration: 0 hours 12 minutes 20 seconds  Findings:                 The terminal ileum appeared normal.                           Multiple small-mouthed diverticula were found in                            the sigmoid colon.  The exam was otherwise without abnormality. The                            rectal wall was too narrow to allow retroflexion.                            Excellent view from anal os obtained.                           Biopsies for histology were taken with a cold                            forceps from the entire colon for evaluation of                            microscopic colitis. Complications:            No immediate complications. Estimated blood loss:                            None. Estimated Blood Loss:     Estimated blood loss: none. Impression:               - The examined portion of the ileum was normal.                           - Diverticulosis in the sigmoid colon.                           - The examination was otherwise normal.                           - Biopsies were taken with a cold forceps from the                            entire colon for evaluation of  microscopic colitis. Recommendation:           - Repeat colonoscopy in 10 years for screening                            purposes.                           - Patient has a contact number available for                            emergencies. The signs and symptoms of potential                            delayed complications were discussed with the                            patient. Return to normal activities tomorrow.                            Written discharge instructions  were provided to the                            patient.                           - Resume previous diet.                           - Continue present medications.                           - Await pathology results.                           -EGD today. Please see report Wilhemina Bonito. Marina Goodell, MD 10/06/2022 12:03:59 PM This report has been signed electronically.

## 2022-10-06 NOTE — Progress Notes (Signed)
Vss nad trans to pacu °

## 2022-10-06 NOTE — Patient Instructions (Signed)
YOU HAD AN ENDOSCOPIC PROCEDURE TODAY AT THE [facility]:   Refer to the procedure report that was given to you for any specific questions about what was found during the examination.  If the procedure report does not answer your questions, please call your gastroenterologist to clarify.  If you requested that your care partner not be given the details of your procedure findings, then the procedure report has been included in a sealed envelope for you to review at your convenience later.  YOU SHOULD EXPECT: Some feelings of bloating in the abdomen. Passage of more gas than usual.  Walking can help get rid of the air that was put into your GI tract during the procedure and reduce the bloating. If you had a lower endoscopy (such as a colonoscopy or flexible sigmoidoscopy) you may notice spotting of blood in your stool or on the toilet paper. If you underwent a bowel prep for your procedure, you may not have a normal bowel movement for a few days.  Please Note:  You might notice some irritation and congestion in your nose or some drainage.  This is from the oxygen used during your procedure.  There is no need for concern and it should clear up in a day or so.  SYMPTOMS TO REPORT IMMEDIATELY:  Following lower endoscopy (colonoscopy or flexible sigmoidoscopy):  Excessive amounts of blood in the stool  Significant tenderness or worsening of abdominal pains  Swelling of the abdomen that is new, acute  Fever of 100F or higher  Following upper endoscopy (EGD)  Vomiting of blood or coffee ground material  New chest pain or pain under the shoulder blades  Painful or persistently difficult swallowing  New shortness of breath  Fever of 100F or higher  Black, tarry-looking stools  For urgent or emergent issues, a gastroenterologist can be reached at any hour by calling [phone]. Do not use MyChart messaging for urgent concerns.    DIET:  We do recommend a small meal at first, but  then you may proceed to your regular diet.  Drink plenty of fluids but you should avoid alcoholic beverages for 24 hours.  ACTIVITY:  You should plan to take it easy for the rest of today and you should NOT DRIVE or use heavy machinery until tomorrow (because of the sedation medicines used during the test).    FOLLOW UP: Our staff will call the number listed on your records the next business day following your procedure.  We will call around 7:15- 8:00 am to check on you and address any questions or concerns that you may have regarding the information given to you following your procedure. If we do not reach you, we will leave a message.     If any biopsies were taken you will be contacted by phone or by letter within the next 1-3 weeks.  Please call us at [phone] if you have not heard about the biopsies in 3 weeks.    SIGNATURES/CONFIDENTIALITY: You and/or your care partner have signed paperwork which will be entered into your electronic medical record.  These signatures attest to the fact that that the information above on your After Visit Summary has been reviewed and is understood.  Full responsibility of the confidentiality of this discharge information lies with you and/or your care-partner.  

## 2022-10-06 NOTE — Progress Notes (Signed)
HISTORY OF PRESENT ILLNESS:   Matthew Lynch is a 52 y.o. male, single father of 65, and current smoker, with a history of anxiety/depression and poorly controlled type 2 diabetes mellitus who was sent today by the family practice center regarding complaints of abdominal pain and chronic diarrhea.   The patient reports being diagnosed with diabetes approximately 2 and half years ago.  His medication list states that he is on metformin.  He states that he is not on metformin.  Review of blood work from April 23, 2022 shows a hemoglobin A1c of 13.7.  Comprehensive metabolic panel on that day revealed a blood glucose level of 384.  ALT was 96.  Other liver test normal.  CBC revealed a hemoglobin of 15.5.  Patient tells me that he has had diarrhea over the past 4 to 5 months.  Very loose.  Often has incontinent episodes.  Can be incontinent at night.  Tells me that he has not had work-up.  He describes his stools as looking oatmeal consistency.  He does not describe bleeding or stearrhea.  He tells me his baseline weight is about 126 pounds.  Currently 101 pounds.  He had been evaluated in the past for abdominal pain with nausea and diarrhea for which she underwent CT scan Jan 16, 2020.  The exam was negative.  More recently, June 2023, he underwent CT scan elsewhere.  Again unremarkable.  Actually stated that he had increased colonic stool.  He complains of increased intestinal gas.  Currently not working.  Denies alcohol use.   REVIEW OF SYSTEMS:   All non-GI ROS negative unless otherwise stated in the HPI except for anxiety, depression, back pain, visual change, confusion, fatigue, headaches, night sweats, shortness of breath, sleeping problems, ankle edema, excessive thirst, excessive urination, urinary frequency, urinary pain, urinary leakage       Past Medical History:  Diagnosis Date   Anxiety     Depression     Diabetes mellitus without complication ([place])        History reviewed. No pertinent  surgical history.   Social History Matthew Lynch  reports that he has been smoking cigarettes. He has been smoking an average of 1 pack per day. He has never used smokeless tobacco. He reports that he does not drink alcohol and does not use drugs.   family history includes Colon cancer in his maternal grandfather; Diabetes in his maternal great-grandmother and sister.   No Known Allergies       PHYSICAL EXAMINATION: Vital signs: BP 110/80   Pulse 96   Ht 5\' 5"  (1.651 m)   Wt 101 lb (45.8 kg)   BMI 16.81 kg/m   Constitutional: Very thin but otherwise generally well-appearing, no acute distress Psychiatric: alert and oriented x3, cooperative Eyes: extraocular movements intact, anicteric, conjunctiva pink Mouth: oral pharynx moist, no lesions Neck: supple no lymphadenopathy Cardiovascular: heart regular rate and rhythm, no murmur Lungs: clear to auscultation bilaterally Abdomen: soft, complains of tenderness with minimal palpation, nondistended, no obvious ascites, no peritoneal signs, normal bowel sounds, no organomegaly Rectal: Deferred until colonoscopy Extremities: no clubbing, cyanosis, or lower extremity edema bilaterally Skin: no lesions on visible extremities Neuro: No focal deficits.  Cranial nerves intact   ASSESSMENT:   1.  Chronic abdominal pain.  Vague.  Negative previous evaluations. 2.  Chronic diarrhea with incontinence.  Rule out infection.  Rule out diabetic related.  Rule out bacterial overgrowth.  Rule out colitis, microscopic or otherwise. 3.  Poorly controlled diabetes  mellitus 4.  Weight loss likely due to #3 above and possibly #2 above     PLAN:   1.  Stool studies for enteric pathogens and C. difficile. 2.  Imodium as needed for diarrhea. 3.  Prescribe metronidazole 250 mg 4 times daily for 10 days 4.  Schedule colonoscopy with biopsies to provide colon cancer screening as well as to evaluate diarrhea and abdominal pain. 5.  Schedule upper endoscopy  with biopsies to evaluate abdominal pain and diarrhea. 6.  Additional recommendations after the above completed

## 2022-10-06 NOTE — Progress Notes (Signed)
Pt's states no medical or surgical changes since previsit or office visit. 

## 2022-10-06 NOTE — Op Note (Signed)
[place] Patient Name: Matthew Lynch Procedure Date: 10/06/2022 11:19 AM MRN: [id number] Endoscopist: Docia Chuck. Henrene Pastor , MD, [id number] Age: 52 Referring MD:  Date of Birth: Apr 29, 1971 Gender: Male Account #: [id number] Procedure:                Upper GI endoscopy with biopsies Indications:              Diarrhea, Weight loss Medicines:                Monitored Anesthesia Care Procedure:                Pre-Anesthesia Assessment:                           - Prior to the procedure, a History and Physical                            was performed, and patient medications and                            allergies were reviewed. The patient's tolerance of                            previous anesthesia was also reviewed. The risks                            and benefits of the procedure and the sedation                            options and risks were discussed with the patient.                            All questions were answered, and informed consent                            was obtained. Prior Anticoagulants: The patient has                            taken no anticoagulant or antiplatelet agents. ASA                            Grade Assessment: II - A patient with mild systemic                            disease. After reviewing the risks and benefits,                            the patient was deemed in satisfactory condition to                            undergo the procedure.                           After obtaining informed consent, the endoscope was  passed under direct vision. Throughout the                            procedure, the patient's blood pressure, pulse, and                            oxygen saturations were monitored continuously. The                            GIF [id number] #[id number] was introduced through the                            mouth, and advanced to the second part of duodenum.                            The upper GI  endoscopy was accomplished without                            difficulty. The patient tolerated the procedure                            well. Scope In: Scope Out: Findings:                 The esophagus was normal.                           The stomach was normal.                           The examined duodenum was normal. Biopsies for                            histology were taken with a cold forceps for                            evaluation of celiac disease.                           The cardia and gastric fundus were normal on                            retroflexion. Complications:            No immediate complications. Estimated Blood Loss:     Estimated blood loss: none. Impression:               - Normal esophagus.                           - Normal stomach.                           - Normal examined duodenum. Biopsied. Recommendation:           - Patient has a contact number available for  emergencies. The signs and symptoms of potential                            delayed complications were discussed with the                            patient. Return to normal activities tomorrow.                            Written discharge instructions were provided to the                            patient.                           - Resume previous diet.                           - Continue present medications.                           - Await pathology results. We will contact you                            regarding results and follow-up plans. Docia Chuck. Henrene Pastor, MD 10/06/2022 12:13:23 PM This report has been signed electronically.

## 2022-10-07 ENCOUNTER — Telehealth: Payer: Self-pay | Admitting: *Deleted

## 2022-10-07 ENCOUNTER — Ambulatory Visit (INDEPENDENT_AMBULATORY_CARE_PROVIDER_SITE_OTHER): Payer: 59 | Admitting: Pharmacist

## 2022-10-07 ENCOUNTER — Encounter: Payer: Self-pay | Admitting: Pharmacist

## 2022-10-07 VITALS — Wt 113.4 lb

## 2022-10-07 DIAGNOSIS — E1140 Type 2 diabetes mellitus with diabetic neuropathy, unspecified: Secondary | ICD-10-CM

## 2022-10-07 DIAGNOSIS — Z794 Long term (current) use of insulin: Secondary | ICD-10-CM

## 2022-10-07 DIAGNOSIS — Z7409 Other reduced mobility: Secondary | ICD-10-CM | POA: Diagnosis not present

## 2022-10-07 DIAGNOSIS — E1165 Type 2 diabetes mellitus with hyperglycemia: Secondary | ICD-10-CM

## 2022-10-07 DIAGNOSIS — R6889 Other general symptoms and signs: Secondary | ICD-10-CM | POA: Diagnosis not present

## 2022-10-07 DIAGNOSIS — E785 Hyperlipidemia, unspecified: Secondary | ICD-10-CM | POA: Diagnosis not present

## 2022-10-07 DIAGNOSIS — M4802 Spinal stenosis, cervical region: Secondary | ICD-10-CM | POA: Diagnosis not present

## 2022-10-07 MED ORDER — LANTUS SOLOSTAR 100 UNIT/ML ~~LOC~~ SOPN: 30.0000 [IU] | PEN_INJECTOR | Freq: Every day | SUBCUTANEOUS | 3 refills | Status: DC

## 2022-10-07 MED ORDER — INSULIN LISPRO (1 UNIT DIAL) 100 UNIT/ML (KWIKPEN): 6.0000 [IU] | PEN_INJECTOR | Freq: Two times a day (BID) | SUBCUTANEOUS | 3 refills | Status: DC

## 2022-10-07 MED ORDER — GABAPENTIN 300 MG PO CAPS: 300.0000 mg | ORAL_CAPSULE | Freq: Three times a day (TID) | ORAL | 6 refills | Status: DC

## 2022-10-07 MED ORDER — METFORMIN HCL 1000 MG PO TABS: 1000.0000 mg | ORAL_TABLET | Freq: Two times a day (BID) | ORAL | 0 refills | Status: DC

## 2022-10-07 MED ORDER — ROSUVASTATIN CALCIUM 10 MG PO TABS: 10.0000 mg | ORAL_TABLET | Freq: Every day | ORAL | 3 refills | Status: DC

## 2022-10-07 MED ORDER — FREESTYLE LIBRE 3 SENSOR MISC: 1.0000 | 11 refills | Status: DC

## 2022-10-07 NOTE — Assessment & Plan Note (Signed)
>>  ASSESSMENT AND PLAN FOR TYPE 2 DIABETES MELLITUS WITH HYPERGLYCEMIA ([facility]) WRITTEN ON 10/07/2022  4:25 PM BY KOVAL, PETER G, RPH-CPP  Diabetes longstanding for 3 years currently uncontrolled but improved. Patient is able to verbalize appropriate hypoglycemia management plan. Medication adherence appears okay. Control is suboptimal due to no insurance; has gotten Medicaid since last Rx Clinic visit. -Started basal insulin Lantus (insulin glargine) 30 units daily.  -Discontinue basal insulin Tresiba (insulin degludec) 30 units daily.  -Started rapid insulin Humalog (insulin lispro) 6-8 units three times daily with meals.  -Discontinue rapid insulin Fiasp (insulin aspart) 6-8 units three times daily with meals.  -Restarted metformin 1000 mg BID with a meal.  -Increased gabapentin 300 mg from once daily to TID for greater neuropathy control after discussion with Dr. Melba Coon.  -Consider restarting duloxetine (Cymbalta) 30 mg daily at f/u with PCP for more neuropathic benefit. -Started freestyle Libre 3 CGM. Provided sensor sample during visit and counseled on how to use the system.  -Patient educated on purpose, proper use, and potential adverse effects.  -Extensively discussed pathophysiology of diabetes, recommended lifestyle interventions, dietary effects on blood sugar control.

## 2022-10-07 NOTE — Patient Instructions (Addendum)
We placed a Freestyle Libre Continuous Blood Glucose Monitor today.    New prescriptions have been sent to your pharmacy.   From the pharmacy pick up the following medications:  -RESTART metformin 1000 mg twice daily with meals -RESTART rosuvastatin 10 mg daily. -START Lantus (insulin glargine) 30 units daily -START Humalog (insulin lispro) 6-8 units three times daily before each meal. Finish off your supply of Fiasp (insulin aspart) first.  -INCREASE frequency of gabapentin 300 mg to THREE TIMES A DAY. This should help improve your neuropathy.  -USE the freestyle libre 3 sensors. Change every 14 days.   It was great to see you today. I hope you are as proud of your progress as we are.

## 2022-10-07 NOTE — Assessment & Plan Note (Signed)
Diabetes longstanding for 3 years currently uncontrolled but improved. Patient is able to verbalize appropriate hypoglycemia management plan. Medication adherence appears okay. Control is suboptimal due to no insurance; has gotten Medicaid since last Rx Clinic visit. -Started basal insulin Lantus (insulin glargine) 30 units daily.  -Discontinue basal insulin Tresiba (insulin degludec) 30 units daily.  -Started rapid insulin Humalog (insulin lispro) 6-8 units three times daily with meals.  -Discontinue rapid insulin Fiasp (insulin aspart) 6-8 units three times daily with meals.  -Restarted metformin 1000 mg BID with a meal.  -Increased gabapentin 300 mg from once daily to TID for greater neuropathy control after discussion with Dr. Nita Sells.  -Consider restarting duloxetine (Cymbalta) 30 mg daily at f/u with PCP for more neuropathic benefit. -Started freestyle Libre 3 CGM. Provided sensor sample during visit and counseled on how to use the system.  -Patient educated on purpose, proper use, and potential adverse effects.  -Extensively discussed pathophysiology of diabetes, recommended lifestyle interventions, dietary effects on blood sugar control.

## 2022-10-07 NOTE — Progress Notes (Signed)
S:     Chief Complaint  Patient presents with   Medication Management    Diabetes    52 y.o. male who presents for diabetes evaluation, education, and management.  PMH is significant for T2DM, diabetic neuropathy and HLD.  Patient was referred and last seen by Primary Care Provider, Dr. Nita Sells, on 09/07/2022.  At last visit with Rx Clinic (09/21/2022), patient was continued on Tresiba (insulin degludec) 30 units daily. We increased dose of Fiasp (insulin aspart) to 6 units with breakfast and 6 units with dinner (or 8 units with dinner if blood sugar before meal is >150 mg/dL).   Today, patient arrives in good spirits and presents without any assistance.  Current diabetes medications include: gabapentin 300 mg once daily, Fiasp (insulin aspart) 6-8 units three times a day with meals, Tresiba (insulin degludec) 30 units daily.   Current hyperlipidemia medications include: rosuvastatin 10 mg daily.  Patient reports adherence to taking insulins as prescribed. He does not take prescribed duloxetine 30 mg daily, rosuvastatin 10 mg daily, or metformin 2000 mg daily due to previous lack of insurance. He now has Medicaid and can afford medications.   Insurance coverage: [facility]  Patient reports hypoglycemic events. Patient reports neuropathy (nerve pain).  Patient reported dietary habits: Eats 3 meals/day  O:   Review of Systems  Neurological:  Positive for tingling (diabetic neuropathy in the legs).  Psychiatric/Behavioral:  The patient is nervous/anxious (improved from last visit; social/familial life stressors).     Physical Exam Constitutional:      Appearance: Normal appearance.  Pulmonary:     Effort: Pulmonary effort is normal.     Breath sounds: Normal breath sounds.  Neurological:     Mental Status: He is alert.  Psychiatric:        Mood and Affect: Mood normal.        Behavior: Behavior normal.        Thought Content: Thought content normal.         Judgment: Judgment normal.    7 day average blood glucose: 137 mg/dL 14-day Average Glucose: 148 mg/dL   Lab Results  Component Value Date   HGBA1C 11.3 (A) 07/26/2022   There were no vitals filed for this visit.  Lipid Panel     Component Value Date/Time   CHOL 171 05/17/2022 1526   TRIG 351 (H) 05/17/2022 1526   HDL 53 05/17/2022 1526   CHOLHDL 3.2 05/17/2022 1526   CHOLHDL 5.2 01/25/2020 1308   VLDL 54 (H) 01/25/2020 1308   LDLCALC 64 05/17/2022 1526    Clinical Atherosclerotic Cardiovascular Disease (ASCVD): No  The 10-year ASCVD risk score (Arnett DK, et al., 2019) is: 9.9%   Values used to calculate the score:     Age: 19 years     Sex: Male     Is Non-Hispanic African American: Yes     Diabetic: Yes     Tobacco smoker: Yes     Systolic Blood Pressure: 195 mmHg     Is BP treated: No     HDL Cholesterol: 53 mg/dL     Total Cholesterol: 171 mg/dL   A/P: Diabetes longstanding for 3 years currently uncontrolled but improved. Patient is able to verbalize appropriate hypoglycemia management plan. Medication adherence appears okay. Control is suboptimal due to no insurance; has gotten Medicaid since last Rx Clinic visit. -Started basal insulin Lantus (insulin glargine) 30 units daily.  -Discontinue basal insulin Tresiba (insulin degludec) 30 units daily.  -  Started rapid insulin Humalog (insulin lispro) 6-8 units three times daily with meals.  -Discontinue rapid insulin Fiasp (insulin aspart) 6-8 units three times daily with meals.  -Restarted metformin 1000 mg BID with a meal.  -Increased gabapentin 300 mg from once daily to TID for greater neuropathy control after discussion with Dr. Nita Sells.  -Consider restarting duloxetine (Cymbalta) 30 mg daily at f/u with PCP for more neuropathic benefit. -Started freestyle Libre 3 CGM. Provided sensor sample during visit and counseled on how to use the system.  -Patient educated on purpose, proper use, and potential adverse  effects.  -Extensively discussed pathophysiology of diabetes, recommended lifestyle interventions, dietary effects on blood sugar control.  -Counseled on s/sx of and management of hypoglycemia.   ASCVD risk - primary prevention in patient with diabetes. Last LDL is 64; at goal of <70 mg/dL. ASCVD risk factors include T2DM and HLD; 10-year ASCVD risk score of 9.9%. moderate intensity statin indicated.  -Restarted rosuvastatin 10 mg now that patient can afford with Medicaid.   Written patient instructions provided. Patient verbalized understanding of treatment plan.  Total time in face to face counseling 40 minutes.    Follow-up:  Pharmacist 2 weeks (10/21/2022). Patient seen with Dixon Boos, PharmD Candidate, Francena Hanly, PharmD PGY-1 Pharmacy Resident and Joseph Art, PharmD, PGY2 Pharmacy Resident.

## 2022-10-07 NOTE — Telephone Encounter (Signed)
  Follow up Call-     10/06/2022   11:07 AM  Call back number  Post procedure Call Back phone  # [phone]  Permission to leave phone message Yes     Patient questions:  Do you have a fever, pain , or abdominal swelling? No. Pain Score  0 *  Have you tolerated food without any problems? Yes.    Have you been able to return to your normal activities? Yes.    Do you have any questions about your discharge instructions: Diet   No. Medications  No. Follow up visit  No.  Do you have questions or concerns about your Care? No.  Actions: * If pain score is 4 or above: No action needed, pain <4.

## 2022-10-11 ENCOUNTER — Encounter: Payer: Self-pay | Admitting: Internal Medicine

## 2022-10-11 DIAGNOSIS — Z7409 Other reduced mobility: Secondary | ICD-10-CM | POA: Diagnosis not present

## 2022-10-11 DIAGNOSIS — R6889 Other general symptoms and signs: Secondary | ICD-10-CM | POA: Diagnosis not present

## 2022-10-11 DIAGNOSIS — M4802 Spinal stenosis, cervical region: Secondary | ICD-10-CM | POA: Diagnosis not present

## 2022-10-12 NOTE — Progress Notes (Signed)
Reviewed and agree with Dr Graylin Shiver plan.

## 2022-10-20 DIAGNOSIS — M4802 Spinal stenosis, cervical region: Secondary | ICD-10-CM | POA: Diagnosis not present

## 2022-10-20 DIAGNOSIS — Z7409 Other reduced mobility: Secondary | ICD-10-CM | POA: Diagnosis not present

## 2022-10-20 DIAGNOSIS — R6889 Other general symptoms and signs: Secondary | ICD-10-CM | POA: Diagnosis not present

## 2022-10-21 ENCOUNTER — Ambulatory Visit (INDEPENDENT_AMBULATORY_CARE_PROVIDER_SITE_OTHER): Payer: 59 | Admitting: Pharmacist

## 2022-10-21 ENCOUNTER — Encounter: Payer: Self-pay | Admitting: Pharmacist

## 2022-10-21 VITALS — BP 109/76 | HR 104 | Wt 113.2 lb

## 2022-10-21 DIAGNOSIS — E1165 Type 2 diabetes mellitus with hyperglycemia: Secondary | ICD-10-CM | POA: Diagnosis not present

## 2022-10-21 DIAGNOSIS — Z794 Long term (current) use of insulin: Secondary | ICD-10-CM

## 2022-10-21 DIAGNOSIS — R4589 Other symptoms and signs involving emotional state: Secondary | ICD-10-CM

## 2022-10-21 DIAGNOSIS — E1140 Type 2 diabetes mellitus with diabetic neuropathy, unspecified: Secondary | ICD-10-CM

## 2022-10-21 MED ORDER — LANTUS SOLOSTAR 100 UNIT/ML ~~LOC~~ SOPN: 28.0000 [IU] | PEN_INJECTOR | Freq: Every day | SUBCUTANEOUS | 3 refills | Status: DC

## 2022-10-21 MED ORDER — DULOXETINE HCL 30 MG PO CPEP: 30.0000 mg | ORAL_CAPSULE | Freq: Every day | ORAL | 3 refills | Status: DC

## 2022-10-21 MED ORDER — METFORMIN HCL ER 500 MG PO TB24: 1000.0000 mg | ORAL_TABLET | Freq: Two times a day (BID) | ORAL | 11 refills | Status: DC

## 2022-10-21 NOTE — Patient Instructions (Signed)
It was nice to see you today!  Your goal blood sugar is 80-130 before eating and less than 180 after eating.  Medication Changes: Continue Lantus (insulin glargine) 28 units daily. Make sure you count to ten after injecting this medication.  Continue Humalog (insulin lispro) 6-8 units daily.   Begin Cymbalta (duloxetine) 30 mg daily.  Discontinue gabapentin.   Monitor blood sugars at home and keep a log (glucometer or piece of paper) to bring with you to your next visit.  Keep up the good work with diet and exercise. Aim for a diet full of vegetables, fruit and lean meats (chicken, [place], fish). Try to limit salt intake by eating fresh or frozen vegetables (instead of canned), rinse canned vegetables prior to cooking and do not add any additional salt to meals.

## 2022-10-21 NOTE — Progress Notes (Signed)
S:     Chief Complaint  Patient presents with   Medication Management    Diabetes - Neuropathy   52 y.o. male who presents for diabetes evaluation, education, and management.  PMH is significant for T2DM, anxiety, and diabetic neuropathy.  Patient was referred and last seen by Primary Care Provider, Dr. Nita Sells, on 09/07/2022.  At last visit with Rx clinic on 10/07/2022, patient was started on freestyle libre 3 and patient was able to start Lantus (insulin glargine) 30 units daily. Patient also instructed to take gabapentin 300 mg TID.   Today, patient arrives in good spirits and presents without any assistance.  Current diabetes medications include: Lantus (insulin glargine) 28 units daily, Humalog (insulin lispro) 6-8 units twice daily with meals, metformin 1000 mg BID. Current neuropathy medications include: gabapentin 300 mg TID. Current hyperlipidemia medications include: rosuvastatin 10 mg daily.   Patient reports adherence to taking all medications as prescribed. However, patient reported d/c gabapentin due to worsening neuropathy in the legs that spread to his back. Patient also reports not taking rosuvastatin due to co-pay at the pharmacy. Patient self-tapered Lantus (insulin glargine) from 30 units to 28 units daily.   Do you feel that your medications are working for you? yes Have you been experiencing any side effects to the medications prescribed? Yes - reports worsened neuropathy since titrating gabapentin. We presume this is a result of treatment-induced diabetic neuropathy since regaining blood sugar control in the past few weeks. Patient is also experiencing diarrhea and gas with metformin IR.  Do you have any problems obtaining medications due to transportation or finances? yes Insurance coverage: Aetna  Patient reports hypoglycemic events.  Patient reports nocturia (nighttime urination). Sleeping is very disrupted with urination x5-6 per night.  Patient reports  neuropathy (nerve pain). Patient reports constant "fire" and "needles" constantly and got worse despite titrating gabapentin 300 mg to TID.    Patient reports going to PT a couple times per week.  O:   Review of Systems  Musculoskeletal:  Positive for back pain (neuropathy has progressed to back).  Neurological:  Positive for tingling (Severe neuropathy which originates in the groin and has spreads down the legs).    Physical Exam Constitutional:      Appearance: Normal appearance. He is normal weight.  Pulmonary:     Effort: Pulmonary effort is normal.     Breath sounds: Normal breath sounds.  Neurological:     Mental Status: He is alert.  Psychiatric:        Mood and Affect: Mood normal.        Behavior: Behavior normal.        Thought Content: Thought content normal.        Judgment: Judgment normal.    CGM Download:  % Time CGM is active: 98% Average Glucose: 158 mg/dL Glucose Management Indicator: 7.1%  Glucose Variability: 39.4 (goal <36%) Time in Goal:  - Time in range 70-180: 62% - Time above range: 35% - Time below range: 3% Observed patterns:   Lab Results  Component Value Date   HGBA1C 11.3 (A) 07/26/2022   Vitals:   10/21/22 1416  BP: 109/76  Pulse: (!) 104  SpO2: 100%    Lipid Panel     Component Value Date/Time   CHOL 171 05/17/2022 1526   TRIG 351 (H) 05/17/2022 1526   HDL 53 05/17/2022 1526   CHOLHDL 3.2 05/17/2022 1526   CHOLHDL 5.2 01/25/2020 1308   VLDL 54 (H) 01/25/2020  1308   [place] 05/17/2022 1526    Clinical Atherosclerotic Cardiovascular Disease (ASCVD): No  The 10-year ASCVD risk score (Arnett DK, et al., 2019) is: 11.3%   Values used to calculate the score:     Age: 89 years     Sex: Male     Is Non-Hispanic African American: Yes     Diabetic: Yes     Tobacco smoker: Yes     Systolic Blood Pressure: 0000000 mmHg     Is BP treated: No     HDL Cholesterol: 53 mg/dL     Total Cholesterol: 171 mg/dL    A/P: Diabetes  longstanding currently significantly improved since starting consistent insulin regimen. Patient is able to verbalize appropriate hypoglycemia management plan. Medication adherence appears good. -Continued basal insulin Lantus (insulin glargine) 28 units daily in the morning.  -Continued rapid insulin Humalog (insulin lispro)  6-8 units BID with meals.  -Continued metformin 1000 mg BID however we sent a new Rx for the XR formulation to help with GI side effects. Consider dose reduction at next visit if GI complaints persist.  -Patient educated on purpose, proper use, and potential adverse effects of Lantus (insulin glargine) .  -Extensively discussed pathophysiology of diabetes, recommended lifestyle interventions, dietary effects on blood sugar control.  -Counseled on s/sx of and management of hypoglycemia.  -Next A1c anticipated at next visit (March/April).   Neuropathy- longstanding and uncontrolled. Currently prescribed gabapentin and was titrated to 300 mg TID at last visit. Patient believed gabapentin has making neuropathy pain worsen (spread to legs and back) and is unwilling to continue taking gabapentin at this time.  CGM report showed GMI to be 7.1%. Significant and rapid decrease in A1c from 11.3 recently.  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  -Counseled patient on likelihood that worsened neuropathy is due to rapid sugar control -Discontinued gabapentin as patient unwilling to continue. -Initiated Cymbalta (duloxetine) 30 mg daily -Dr. Argentina Donovan performed physical exam, confirmed diagnosis of treatment-induced neuropathy, and agreed with Cymbalta (duloxetine) treatment.   ASCVD risk - primary prevention in patient with diabetes. Last LDL is 64; at at goal of <70 mg/dL. ASCVD risk factors include T2DM and 10-year ASCVD risk score of 9.9%. moderate intensity statin indicated.  -Continued rosuvastatin 10 mg.  Hopefully patient will be able to afford a refill from the pharmacy in near future.  Written patient instructions provided. Patient verbalized understanding of treatment plan.  Total time in face to face counseling 50 minutes.    Follow-up:  Pharmacist 11/30/2022. PCP clinic visit in 11/08/2022.  Patient seen with Dixon Boos,  PharmD Candidate, Francena Hanly, PharmD PGY-1 Pharmacy Resident and Joseph Art, PharmD, PGY2 Pharmacy Resident.

## 2022-10-22 ENCOUNTER — Encounter: Payer: Self-pay | Admitting: Pharmacist

## 2022-10-22 NOTE — Assessment & Plan Note (Signed)
Neuropathy- longstanding and uncontrolled. Currently prescribed gabapentin and was titrated to 300 mg TID at last visit. Patient believed gabapentin has making neuropathy pain worsen (spread to legs and back) and is unwilling to continue taking gabapentin at this time.  CGM report showed GMI to be 7.1%. Significant and rapid decrease in A1c from 11.3 recently.  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  -Counseled patient on likelihood that worsened neuropathy is due to rapid sugar control -Discontinued gabapentin as patient unwilling to continue. -Initiated Cymbalta (duloxetine) 30 mg daily -Dr. Argentina Donovan performed physical exam, confirmed diagnosis of treatment-induced neuropathy, and agreed with Cymbalta (duloxetine) treatment.

## 2022-10-22 NOTE — Assessment & Plan Note (Signed)
Diabetes longstanding currently significantly improved since starting consistent insulin regimen. Patient is able to verbalize appropriate hypoglycemia management plan. Medication adherence appears good. -Continued basal insulin Lantus (insulin glargine) 28 units daily in the morning.  -Continued rapid insulin Humalog (insulin lispro)  6-8 units BID with meals.  -Continued metformin 1000 mg BID however we sent a new Rx for the XR formulation to help with GI side effects. Consider dose reduction at next visit if GI complaints persist.  -Patient educated on purpose, proper use, and potential adverse effects of Lantus (insulin glargine) .  -Extensively discussed pathophysiology of diabetes, recommended lifestyle interventions, dietary effects on blood sugar control.  -Counseled on s/sx of and management of hypoglycemia.  -Next A1c anticipated at next visit (March/April).

## 2022-10-25 NOTE — Progress Notes (Signed)
Reviewed and agree with Dr Graylin Shiver plan.

## 2022-11-04 NOTE — Patient Instructions (Addendum)
It was wonderful to see you today.  Please bring ALL of your medications with you to every visit.   Today we talked about:  Congratulations! Your A1c is improved today to 7.9. Our ultimate goal is to have this under 7. You are almost there. I am not making any adjustments to your insulin at this time. I would recommend you check your sugars in the morning before eating- ideally our fasting goal would be <130.   If you feel that you are having recurrent low sugars, please come see me.  You will be due for another A1c in 3 months but I would like to see you in 1 month to see how the increase in Cymbalta is working.  Thank you for coming to your visit as scheduled. We have had a large "no-show" problem lately, and this significantly limits our ability to see and care for patients. As a friendly reminder- if you cannot make your appointment please call to cancel. We do have a no show policy for those who do not cancel within 24 hours. Our policy is that if you miss or fail to cancel an appointment within 24 hours, 3 times in a 79-month period, you may be dismissed from our clinic.   Thank you for choosing [facility].   Please call [phone] with any questions about today's appointment.  Please be sure to schedule follow up at the front  desk before you leave today.   Sharion Settler, DO PGY-3 Family Medicine

## 2022-11-04 NOTE — Progress Notes (Signed)
SUBJECTIVE:   CHIEF COMPLAINT / HPI:   Matthew Lynch is a 52 y.o. male who presents to the [facility] clinic today to discuss the following concerns:   Uncontrolled K2HC Complicated by Peripheral Neuropathy Ongoing neuropathic pain. Previously treated with gabapentin 300 mg 3 times daily, patient stopped as he felt this made his neuropathy worse.  His CGM showed GMI of 7.1%, his worsening neuropathy was thought to be due to treatment induced neuropathy of diabetes due to insulin use.  In any case he was switched to Cymbalta 30 mg daily as he was unwilling to continue with gabapentin.   Since starting the Cymbalta he feels the nerve pain has slightly improved- he continues to have neuropathic pain, however. He states that he no longer has the CGM as he cannot afford it. He has been checking his sugars once a day, in the morning after eating breakfast and it is 240s-250s. He states he has felt symptoms of low sugars about 2-3 times since last seeing Dr. Valentina Lucks on 2/22.   Last Hgb A1c 11.3 in November, he is due for another check. His prescription regimen is insulin glargine 28 units in the morning, insulin Humalog 6-8 units twice daily with meals, metformin at 1000 mg twice daily. He currently takes 30U of long acting and 6U of humalong twice a day, metformin 1000 mg twice daily. Reports good compliance. He is prescribed rosuvastatin 10 mg but has not picked this up yet. He is not on an ACE-I/ARB/SGLT-2 given no hx of HTN. He is due for ophthalmologic examination (referral sent to Dr. Gershon Crane in September). He is UTD on foot examination. Urine microalbumin 08/2022 WNL.   PERTINENT  PMH / PSH: Anxiety and depression, chronic bowel and bladder incontinence  OBJECTIVE:   BP 131/89   Pulse (!) 107   Ht 5\' 5"  (1.651 m)   Wt 113 lb 6.4 oz (51.4 kg)   SpO2 100%   BMI 18.87 kg/m   Vitals:   11/08/22 1130 11/08/22 1156  BP: 131/89 108/70  Pulse: (!) 107 92  SpO2: 100%    General: pleasant, able to  participate in exam Respiratory: normal effort Neuro: alert, antalgic gait, slow to get up and move around but able to sit up and walk independently, clear speech  Psych: Flat affect and depressed mood     11/08/2022   11:41 AM 09/07/2022    2:19 PM 07/26/2022    1:46 PM  Depression screen PHQ 2/9  Decreased Interest 3 3 3   Down, Depressed, Hopeless 3 3 3   PHQ - 2 Score 6 6 6   Altered sleeping 3 3 3   Tired, decreased energy 3 3 3   Change in appetite 3 3 3   Feeling bad or failure about yourself  3 3 3   Trouble concentrating 3 3 3   Moving slowly or fidgety/restless 3 3 3   Suicidal thoughts 0 0 3  PHQ-9 Score 24 24 27   Difficult doing work/chores Extremely dIfficult      ASSESSMENT/PLAN:   1. Type 2 diabetes mellitus with diabetic neuropathy, without long-term current use of insulin (HCC) Hgb 11.3 in November, now improved to 7.9.  Congratulated patient on improvement.  Does appear that CGM helped him greatly, will see if Pharmacy team can assist him with additional device. Still having intermittent lows, he does feel symptomatic with these.  Would not change insulin regimen at this time unless occurring with more frequency.  Has had slight improvement with starting Cymbalta, amenable to increasing  dose today. - HgB A1c, due again in 3 months -Continue 30 units glargine daily - insulin lispro (HUMALOG KWIKPEN) 100 UNIT/ML KwikPen; Inject 6-8 Units into the skin 2 (two) times daily before a meal.  Dispense: 15 mL; Refill: 3 - Start crestor 10 mg - Due for eye examination -Increase Cymbalta to 60 units daily  2. Hyperlipidemia, unspecified hyperlipidemia type Given HLD, T2DM, recommended statin.  - rosuvastatin (CRESTOR) 10 MG tablet; Take 1 tablet (10 mg total) by mouth daily.  Dispense: 90 tablet; Refill: 3  3. Anxiety and depression His mood symptoms stem largely from his chronic pain from his neuropathy. PHQ-9 score elevated today but without SI. Slight improvement with cymbalta,  hopefully dose increase will help both neuropathy and mood symptoms. Has adamantly declined therapy in the past.    Sharion Settler, [place]

## 2022-11-08 ENCOUNTER — Encounter: Payer: Self-pay | Admitting: Family Medicine

## 2022-11-08 ENCOUNTER — Ambulatory Visit (INDEPENDENT_AMBULATORY_CARE_PROVIDER_SITE_OTHER): Payer: 59 | Admitting: Family Medicine

## 2022-11-08 VITALS — BP 108/70 | HR 92 | Ht 65.0 in | Wt 113.4 lb

## 2022-11-08 DIAGNOSIS — F32A Depression, unspecified: Secondary | ICD-10-CM

## 2022-11-08 DIAGNOSIS — E1140 Type 2 diabetes mellitus with diabetic neuropathy, unspecified: Secondary | ICD-10-CM

## 2022-11-08 DIAGNOSIS — F419 Anxiety disorder, unspecified: Secondary | ICD-10-CM | POA: Diagnosis not present

## 2022-11-08 DIAGNOSIS — E785 Hyperlipidemia, unspecified: Secondary | ICD-10-CM | POA: Diagnosis not present

## 2022-11-08 LAB — POCT GLYCOSYLATED HEMOGLOBIN (HGB A1C): HbA1c, POC (controlled diabetic range): 7.9 % — AB (ref 0.0–7.0)

## 2022-11-08 MED ORDER — DULOXETINE HCL 60 MG PO CPEP: 60.0000 mg | ORAL_CAPSULE | Freq: Every day | ORAL | 1 refills | Status: DC

## 2022-11-08 MED ORDER — INSULIN LISPRO (1 UNIT DIAL) 100 UNIT/ML (KWIKPEN): 6.0000 [IU] | PEN_INJECTOR | Freq: Two times a day (BID) | SUBCUTANEOUS | 3 refills | Status: DC

## 2022-11-08 MED ORDER — ROSUVASTATIN CALCIUM 10 MG PO TABS: 10.0000 mg | ORAL_TABLET | Freq: Every day | ORAL | 3 refills | Status: DC

## 2022-11-11 ENCOUNTER — Telehealth: Payer: Self-pay | Admitting: Pharmacist

## 2022-11-11 NOTE — Telephone Encounter (Signed)
-----   Message from Sharion Settler, DO sent at 11/10/2022  3:04 PM EDT ----- Regarding: Assistance with CGM? Hi Dr. Valentina Lucks,  I wanted to reach out to you about this patient whom you have helped in the past (thank you!). He no longer has the [place] and is trying to fingerstick for glucose readings but I think he would benefit from the CGM if there was any way to get patient assistance for it? He says that he cannot afford it. Just wanted to reach out to see if you had any ideas. He said you have provided him with two samples in the past but I know that is not sustainable long-term.  Thank you! Ale

## 2022-11-11 NOTE — Telephone Encounter (Signed)
Contacted patient to discuss glucose control.   Reports he is checking blood sugars and seeing fasting readings of high 100s ~ 180 and later day readings in the low 200s.   He is willing and interested in using [place] 3 sensors until next Rx visit and PCP visits in April.   He will come today to pick up TWO -14 day sensors.

## 2022-11-12 NOTE — Telephone Encounter (Signed)
Reviewed and agree with Dr Koval's plan.   

## 2022-11-25 ENCOUNTER — Telehealth: Payer: Self-pay

## 2022-11-25 NOTE — Telephone Encounter (Signed)
Attempted to contact patient RE insulin issue.   No answer.  Left HIPAA compliant message requesting call back on Monday AM to discuss options.  Provided direct phone line - [phone]

## 2022-11-25 NOTE — Telephone Encounter (Signed)
Patient calls nurse line regarding insurance not covering Humalog.   Called pharmacy. They are receiving the following message when they run the Humalog. Non- formulary drug- contact prescriber.   Lantus- $70 with insurance. He reports that he cannot afford this. Was previously $4 with [facility]. I am not able to find where this is an active insurance plan in his chart.   Forwarding to PCP and pharmacy team for further advisement.   Talbot Grumbling, RN

## 2022-11-29 ENCOUNTER — Telehealth: Payer: Self-pay | Admitting: Pharmacist

## 2022-11-29 NOTE — Telephone Encounter (Signed)
Attempted call back to both numbers in chart.   No answer to either.   Left message requesting call back to me at earliest convenience.  [phone] direct phone # provided.

## 2022-11-29 NOTE — Telephone Encounter (Signed)
Reviewed and agree with Dr Koval's plan.   

## 2022-11-30 ENCOUNTER — Encounter: Payer: Self-pay | Admitting: Pharmacist

## 2022-11-30 ENCOUNTER — Ambulatory Visit (INDEPENDENT_AMBULATORY_CARE_PROVIDER_SITE_OTHER): Payer: 59 | Admitting: Pharmacist

## 2022-11-30 VITALS — BP 104/75 | HR 89 | Wt 114.2 lb

## 2022-11-30 DIAGNOSIS — E785 Hyperlipidemia, unspecified: Secondary | ICD-10-CM | POA: Diagnosis not present

## 2022-11-30 DIAGNOSIS — E1140 Type 2 diabetes mellitus with diabetic neuropathy, unspecified: Secondary | ICD-10-CM | POA: Diagnosis not present

## 2022-11-30 MED ORDER — BASAGLAR KWIKPEN 100 UNIT/ML ~~LOC~~ SOPN: 30.0000 [IU] | PEN_INJECTOR | SUBCUTANEOUS | 11 refills | Status: DC

## 2022-11-30 MED ORDER — ROSUVASTATIN CALCIUM 10 MG PO TABS: 10.0000 mg | ORAL_TABLET | Freq: Every day | ORAL | 11 refills | Status: DC

## 2022-11-30 MED ORDER — DULOXETINE HCL 60 MG PO CPEP: 60.0000 mg | ORAL_CAPSULE | Freq: Every day | ORAL | 11 refills | Status: DC

## 2022-11-30 MED ORDER — FREESTYLE LIBRE 3 SENSOR MISC: 1.0000 | 11 refills | Status: DC

## 2022-11-30 MED ORDER — FIASP FLEXTOUCH 100 UNIT/ML ~~LOC~~ SOPN: 6.0000 [IU] | PEN_INJECTOR | Freq: Three times a day (TID) | SUBCUTANEOUS | 11 refills | Status: DC

## 2022-11-30 NOTE — Assessment & Plan Note (Addendum)
Diabetes longstanding currently uncontrolled since patient is unable to obtain his diabetes medications. Patient is able to verbalize appropriate hypoglycemia management plan. [place] to resolve billing issues with medicaid/Aetna and to determine preferred medications (Spent 24 minutes in determining coverage).  -Started basal insulin with Basaglar (insulin glargine) 30 units daily (preferred). Discontinued Lantus (insulin glargine) 30 units daily since not preferred with insurance plan.   -Started rapid insulin fiasp (insulin aspart) 6-8 units three times daily with meals. Discontinue Humalog (insulin lispro) since not preferred with insurance plan.   -Continued metformin 1000 mg twice daily.  -Patient educated on purpose, proper use, and potential adverse effects of Basaglar (insulin glargine).  -Extensively discussed pathophysiology of diabetes, recommended lifestyle interventions, dietary effects on blood sugar control.  -Counseled on s/sx of and management of hypoglycemia.  -Next A1c anticipated April 2024.

## 2022-11-30 NOTE — Progress Notes (Signed)
S:     Chief Complaint  Patient presents with   Diabetes   52 y.o. male who presents for diabetes evaluation, education, and management.  PMH is significant for DM2 with peripheral neuropathy, tobacco use, anxiety, depression, and chronic bowel and bladder incontinence.  Patient was referred and last seen by Primary Care Provider, Dr. Nita Sells, on 11/08/22.   At last visit with Rx clinic on 10/21/22, he was started on duloxetine and discontinued gabapentin for his neuropathy. There were no changes made to his insulin regimen, although he now reports that he has not been able to pick up his medications due to cost.   Today, patient arrives with increased stress. He reports he has increased his tobacco use to smoking one pack daily. He wants to stop smoking but he states that the stress is what makes it difficult for him to stop. He has not been able to pick up his medications due to affordability. Reports CGM helpful but CBGs have gone up since he has not had mealtime insulin. He presents without any assistance.   Family/Social History: Patient is homeless. He stores his medications at his moms house.   Current diabetes medications include: lantus (insulin glargine) 30 units daily, humalog kwikpen (insulin lispro) 6-8 units three times daily with meals, metformin 1000 mg twice daily Current hyperlipidemia medications include: rosuvastatin 10 mg daily.   Patient reports adherence to taking all medications as prescribed. He has been unable to properly adhere to his medications due to cost.   Do you feel that your medications are working for you? Yes; reports medications worked well when he was able to obtain them.  Have you been experiencing any side effects to the medications prescribed? no Do you have any problems obtaining medications due to transportation or finances? yes Insurance coverage: aetna/medicaid   Patient denies hypoglycemic events.   Patient denies nocturia (nighttime  urination).  Patient denies neuropathy (nerve pain). Patient denies visual changes. Patient denies self foot exams.   O:   Review of Systems  Gastrointestinal:  Positive for diarrhea.  All other systems reviewed and are negative.   Physical Exam Vitals reviewed.     Lab Results  Component Value Date   HGBA1C 7.9 (A) 11/08/2022   Vitals:   11/30/22 1348  BP: 104/75  Pulse: 89  SpO2: 100%    Lipid Panel     Component Value Date/Time   CHOL 171 05/17/2022 1526   TRIG 351 (H) 05/17/2022 1526   HDL 53 05/17/2022 1526   CHOLHDL 3.2 05/17/2022 1526   CHOLHDL 5.2 01/25/2020 1308   VLDL 54 (H) 01/25/2020 1308   LDLCALC 64 05/17/2022 1526    Clinical Atherosclerotic Cardiovascular Disease (ASCVD): No  The 10-year ASCVD risk score (Arnett DK, et al., 2019) is: 10.4%   Values used to calculate the score:     Age: 60 years     Sex: Male     Is Non-Hispanic African American: Yes     Diabetic: Yes     Tobacco smoker: Yes     Systolic Blood Pressure: 123456 mmHg     Is BP treated: No     HDL Cholesterol: 53 mg/dL     Total Cholesterol: 171 mg/dL   A/P: Diabetes longstanding currently uncontrolled since patient is unable to obtain his diabetes medications. Patient is able to verbalize appropriate hypoglycemia management plan. [place] to resolve billing issues with medicaid/Aetna and to determine preferred medications (Spent 24 minutes in determining  coverage).  -Started basal insulin with Basaglar (insulin glargine) 30 units daily (preferred). Discontinued Lantus (insulin glargine) 30 units daily since not preferred with insurance plan.   -Started rapid insulin fiasp (insulin aspart) 6-8 units three times daily with meals. Discontinue Humalog (insulin lispro) since not preferred with insurance plan.   -Continued metformin 1000 mg twice daily.  -Patient educated on purpose, proper use, and potential adverse effects of Basaglar (insulin glargine).  -Extensively  discussed pathophysiology of diabetes, recommended lifestyle interventions, dietary effects on blood sugar control.  -Counseled on s/sx of and management of hypoglycemia.  -Next A1c anticipated April 2024.   Neuropathy- longstanding and uncontrolled. 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 Has been on duloxetine; however, has not been able to pick it up due to cost. Confirmed cost with pharmacy and confirmed that patient will be able to afford the copay. Reports duloxetine is helpful for the treatment of his pain.  -Counseled patient on likelihood that worsened neuropathy is due to rapid sugar control -Continue cymbalta (duloxetine) 60 mg daily  ASCVD risk - primary prevention in patient with diabetes. Last LDL is 64 not at goal <70 mg/dL. ASCVD risk factors include T2DM and 10-year ASCVD risk score of 9.9%. Patient also reports he wants to work on stopping smoking.  -Continue rosuvastatin 10 mg daily. Confirmed with [place] and patient that he will be able to afford new copay.   Chronic Tobacco use disorder - currently expressing interest in attempting quit attempt in near future.  -Encourage smoking cessation.  -Readdress at next visit.   Written patient instructions provided. Patient verbalized understanding of treatment plan.  Total time in face to face counseling 20 minutes.    Follow-up:  Pharmacist 12/21/22. PCP clinic visit in 12/13/22.  Patient seen with Gena Fray, PharmD PGY-1 Pharmacy Resident and Estelle June, PharmD Candidate.

## 2022-11-30 NOTE — Assessment & Plan Note (Signed)
Neuropathy- longstanding and uncontrolled. 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 Has been on duloxetine; however, has not been able to pick it up due to cost. Confirmed cost with pharmacy and confirmed that patient will be able to afford the copay. Reports duloxetine is helpful for the treatment of his pain.  -Counseled patient on likelihood that worsened neuropathy is due to rapid sugar control -Continue cymbalta (duloxetine) 60 mg daily

## 2022-11-30 NOTE — Assessment & Plan Note (Signed)
ASCVD risk - primary prevention in patient with diabetes. Last LDL is 64 not at goal <70 mg/dL. ASCVD risk factors include T2DM and 10-year ASCVD risk score of 9.9%. Patient also reports he wants to work on stopping smoking.  -Continue rosuvastatin 10 mg daily. Confirmed with [place] and patient that he will be able to afford new copay.

## 2022-11-30 NOTE — Patient Instructions (Addendum)
It was nice to see you at your visit today.   Start using basaglar (insulin glargine) 30 units daily and fiasp (insulin aspart) 6-8 units three times daily with meals. Continue to use your Libre 3 to monitor your blood sugars.   Continue to take your duloxetine and rosuvastatin.

## 2022-12-01 NOTE — Progress Notes (Signed)
Reviewed and agree with Dr Koval's plan.   

## 2022-12-06 ENCOUNTER — Telehealth: Payer: Self-pay

## 2022-12-06 NOTE — Telephone Encounter (Signed)
A Prior Authorization was initiated for this patients FREESTYLE LIBRE 3 SENSOR through CoverMyMeds.   Key: [id number]

## 2022-12-09 NOTE — Telephone Encounter (Signed)
Prior Auth for patients medication FREESTYLE LIBRE 3 SENSOR denied by [facility] [employer] via CoverMyMeds.   Reason: may be covered when the member has tried and failed the required number of formulary alternatives. Formulary alternatives: Dexcom CGM. Alternatives may still require a PA.   CoverMyMeds Key: [id number]

## 2022-12-09 NOTE — Telephone Encounter (Signed)
Contacted patient to inform him about his insurance preferring Dexcom over [employer]. Patient states his sensor has 4 hours left. Will leave him a sample at the front to bridge him until his Dexcom is ready for pick up. Patient confirmed he can pick up the sensor today.   Valeda Malm, Pharm.D. PGY-2 Ambulatory Care Pharmacy Resident

## 2022-12-12 NOTE — Progress Notes (Deleted)
    SUBJECTIVE:   CHIEF COMPLAINT / HPI:   Matthew Lynch is a 52 y.o. male who presents to the [facility] clinic today to discuss the following concerns:   T2DM with Neuropathy Most recently seen by Dr. Raymondo Band in pharmacy clinic on 4/2. Was having difficulties obtaining his insulin at that time but appears pharmacy team worked hard with his outpatient pharmacy to resolve barriers. He was started on 30U Basaglar and his Lantus was discontinued. He was also started on mealtime fiasp instead of his humalog. His last A1c in March was 7.9, this is a significant improvement compared to his A1c in November that was 11.3.  He has been compliant on his insulin and metformin (1000 mg BID). *** He does have a CBG.   His neuropathy is thought to also be due to treatment induced neuropathy of diabetes. Cymbalta seems to be helping with his neuropathy. Currently taking 60 mg daily.    PERTINENT  PMH / PSH: Housing insecurity, anxiety depression, hyperlipidemia, chronic bowel and bladder incontinence  OBJECTIVE:   There were no vitals taken for this visit.   General: NAD, pleasant, thin, able to participate in exam Cardiac: RRR, no murmurs. Respiratory: CTAB, normal effort, No wheezes, rales or rhonchi Extremities: no edema or cyanosis. Skin: warm and dry, no rashes noted Neuro: alert, no obvious focal deficits Psych: Normal affect and mood  ASSESSMENT/PLAN:   No problem-specific Assessment & Plan notes found for this encounter.   -Continue 30U basaglar, fiasp 6-8units TID with meals, Metformin 1000 mg BID  -Due for ophthalmological examination  -UTD on foot exam -Labs UTD -Next A1c in June   Sabino Dick, DO [facility]

## 2022-12-13 ENCOUNTER — Ambulatory Visit: Payer: Self-pay | Admitting: Family Medicine

## 2022-12-21 ENCOUNTER — Ambulatory Visit: Payer: 59 | Admitting: Pharmacist

## 2022-12-23 ENCOUNTER — Telehealth: Payer: Self-pay | Admitting: Pharmacist

## 2022-12-23 NOTE — Telephone Encounter (Signed)
Attempted to contact patient following missed appointment 4.24  He was unavailable.  I asked that he reschedule his missed appointment at his convenience.

## 2022-12-23 NOTE — Telephone Encounter (Signed)
Attempted to appointment. Asked to reschedule

## 2022-12-24 ENCOUNTER — Ambulatory Visit (INDEPENDENT_AMBULATORY_CARE_PROVIDER_SITE_OTHER): Payer: 59 | Admitting: Pharmacist

## 2022-12-24 ENCOUNTER — Encounter: Payer: Self-pay | Admitting: Pharmacist

## 2022-12-24 VITALS — BP 111/75 | HR 101 | Wt 112.6 lb

## 2022-12-24 DIAGNOSIS — E1165 Type 2 diabetes mellitus with hyperglycemia: Secondary | ICD-10-CM | POA: Diagnosis not present

## 2022-12-24 DIAGNOSIS — E1140 Type 2 diabetes mellitus with diabetic neuropathy, unspecified: Secondary | ICD-10-CM | POA: Diagnosis not present

## 2022-12-24 DIAGNOSIS — Z794 Long term (current) use of insulin: Secondary | ICD-10-CM | POA: Diagnosis not present

## 2022-12-24 MED ORDER — BASAGLAR KWIKPEN 100 UNIT/ML ~~LOC~~ SOPN
24.0000 [IU] | PEN_INJECTOR | SUBCUTANEOUS | 11 refills | Status: DC
Start: 2022-12-24 — End: 2023-01-17

## 2022-12-24 MED ORDER — METFORMIN HCL ER 500 MG PO TB24: 500.0000 mg | ORAL_TABLET | Freq: Two times a day (BID) | ORAL | 11 refills | Status: DC

## 2022-12-24 MED ORDER — FIASP FLEXTOUCH 100 UNIT/ML ~~LOC~~ SOPN: 4.0000 [IU] | PEN_INJECTOR | Freq: Three times a day (TID) | SUBCUTANEOUS | 11 refills | Status: DC

## 2022-12-24 NOTE — Assessment & Plan Note (Signed)
>>  ASSESSMENT AND PLAN FOR TYPE 2 DIABETES MELLITUS WITH HYPERGLYCEMIA (HCC) WRITTEN ON 12/24/2022 12:31 PM BY KOVAL, PETER G, RPH-CPP  Diabetes longstanding. Patient is able to verbalize appropriate hypoglycemia management plan. Medication adherence appears good. Control is suboptimal due to high variability on CGM report due to high carb food intake and overcorrecting with insulin. -Decreased dose of basal insulin Basaglar (insulin glargine) from 30 units to 24 units daily -Decreased dose of rapid insulin Fiasp FlexTouch (insulin aspart) from 6-8 units TID to 4-6 units TID before meals -Decreased dose of metformin-XR from 1000 mg BID to 500 mg BID for potential diarrhea reduction benefit. If Diarrhea continues consider trial OFF of metformin.  -Extensively discussed pathophysiology of diabetes, recommended lifestyle interventions, dietary effects on blood sugar control.

## 2022-12-24 NOTE — Progress Notes (Signed)
S:     Chief Complaint  Patient presents with   Medication Management    T2DM F/U   52 y.o. male who presents for diabetes evaluation, education, and management.  PMH is significant for T2DM, depression, peripheral neuropathy Patient was referred and last seen by Primary Care Provider, Dr. Melba Coon, on 11/08/22.  At last visit, Patient was switched to Basaglar (insulin glargine) and Fiasp (insulin aspart) since they were the preferred medications on his insurance plan.   Today, patient arrives in good spirits and presents without any assistance.   Current diabetes medications include: Basaglar Kwikpen (insulin glargine) 30 units daily, Fiasp (insulin aspart) 6-8 units TID before meals  Patient reports adherence to taking all medications as prescribed.   Do you feel that your medications are working for you? yes Have you been experiencing any side effects to the medications prescribed? Yes. Loose stools due to metformin Do you have any problems obtaining medications due to transportation or finances? no Insurance coverage: [place] medicaid  Patient reports hypoglycemic events. Has frequent low episodes due to overcorrecting high glucose values. Experiences sweating  Patient reports nocturia (nighttime urination).  Patient reports neuropathy (nerve pain). Reports lots of nerve pain in the groin and inner thigh areas.  Patient denies visual changes. Patient denies self foot exams.   Patient reported dietary habits:Has food security and accessibility issues, leading to poor diet  Within the past 12 months, did you worry whether your food would run out before you got money to buy more? no Within the past 12 months, did the food you bought run out, and you didn't have money to get more? No  Patient reports they smoke 1/2 ppd and would like to quit.  O:   Review of Systems  Gastrointestinal:  Positive for abdominal pain and diarrhea.  Neurological:  Positive for tingling.  All  other systems reviewed and are negative.   Physical Exam Constitutional:      Appearance: Normal appearance.  Pulmonary:     Effort: Pulmonary effort is normal.  Neurological:     Mental Status: He is alert.  Psychiatric:        Mood and Affect: Mood normal.        Behavior: Behavior normal.        Thought Content: Thought content normal.        Judgment: Judgment normal.    Freestyle Libre 3 CGM Download:  % Time CGM is active: 99% Average Glucose: 202 mg/dL Glucose Management Indicator: 8.1%  Glucose Variability: 45.0% (goal <36%) Time in Goal:  - Time in range 70-180: 42% - Time above range: 56% (29% High 181-250 mg/dL, 45% Very High >409 mg/dL) - Time below range: 2% Low 54-69 mg/dL   Lab Results  Component Value Date   HGBA1C 7.9 (A) 11/08/2022   Vitals:   12/24/22 1116  BP: 111/75  Pulse: (!) 101  SpO2: 100%    Lipid Panel     Component Value Date/Time   CHOL 171 05/17/2022 1526   TRIG 351 (H) 05/17/2022 1526   HDL 53 05/17/2022 1526   CHOLHDL 3.2 05/17/2022 1526   CHOLHDL 5.2 01/25/2020 1308   VLDL 54 (H) 01/25/2020 1308   LDLCALC 64 05/17/2022 1526    Clinical Atherosclerotic Cardiovascular Disease (ASCVD): No  The 10-year ASCVD risk score (Arnett DK, et al., 2019) is: 11.7%   Values used to calculate the score:     Age: 85 years     Sex: Male  Is Non-Hispanic African American: Yes     Diabetic: Yes     Tobacco smoker: Yes     Systolic Blood Pressure: 111 mmHg     Is BP treated: No     HDL Cholesterol: 53 mg/dL     Total Cholesterol: 171 mg/dL   Patient is participating in a Managed Medicaid Plan:  Yes   A/P: Diabetes longstanding. Patient is able to verbalize appropriate hypoglycemia management plan. Medication adherence appears good. Control is suboptimal due to high variability on CGM report due to high carb food intake and overcorrecting with insulin. -Decreased dose of basal insulin Basaglar (insulin glargine) from 30 units to 24  units daily -Decreased dose of rapid insulin Fiasp FlexTouch (insulin aspart) from 6-8 units TID to 4-6 units TID before meals -Decreased dose of metformin-XR from 1000 mg BID to 500 mg BID for potential diarrhea reduction benefit. If Diarrhea continues consider trial OFF of metformin.  -Extensively discussed pathophysiology of diabetes, recommended lifestyle interventions, dietary effects on blood sugar control.  Libre 3 sensor provided.  Contacted pharmacy and attempted to resolve insurance issue with sensor coverage.  Time spent in sensor prescription processing/approval - 12 minutes.  -Counseled on s/sx of and management of hypoglycemia.  -Next A1c anticipated 02/08/23.   Tobacco Cessation -Patient agreed with plan to reduce smoking from 1/2 ppd to 3 cigarettes per day by next pharmacy visit on 01/07/23  Written patient instructions provided. Patient verbalized understanding of treatment plan.  Total time in face to face counseling 30 minutes.    Follow-up:  Pharmacist 01/07/23. PCP clinic visit in 12/28/22.  Patient seen with Revonda Standard, PharmD Candidate.

## 2022-12-24 NOTE — Progress Notes (Signed)
Reviewed and agree with Dr Koval's plan.   

## 2022-12-24 NOTE — Assessment & Plan Note (Signed)
Diabetes longstanding. Patient is able to verbalize appropriate hypoglycemia management plan. Medication adherence appears good. Control is suboptimal due to high variability on CGM report due to high carb food intake and overcorrecting with insulin. -Decreased dose of basal insulin Basaglar (insulin glargine) from 30 units to 24 units daily -Decreased dose of rapid insulin Fiasp FlexTouch (insulin aspart) from 6-8 units TID to 4-6 units TID before meals -Decreased dose of metformin-XR from 1000 mg BID to 500 mg BID for potential diarrhea reduction benefit. If Diarrhea continues consider trial OFF of metformin.  -Extensively discussed pathophysiology of diabetes, recommended lifestyle interventions, dietary effects on blood sugar control.

## 2022-12-24 NOTE — Patient Instructions (Signed)
It was great seeing you today!  Here are the changes we are making to your regimen today:  DECREASE the dose of Basaglar Kwikpen (insulin glargine) to 24 units daily  DECREASE the dose of Fiasp FlexTouch (insulin aspart) to 4-6 units 3 times daily before meals  DECREASE the dose of metformin to 500 mg (one tablet) twice daily

## 2022-12-27 NOTE — Progress Notes (Deleted)
    SUBJECTIVE:   CHIEF COMPLAINT / HPI:   Matthew Lynch is a 52 y.o. male who presents to the [facility] clinic today to discuss the following concerns:   Diabetes, Type 2 - Last A1c 7.9 in March - Medications: *** - Compliance: *** - Checking BG at home: *** - Diet: *** - Exercise: *** - Eye exam: *** - Foot exam: *** - Microalbumin: *** - Statin: *** - Denies symptoms of hypoglycemia, polyuria, polydipsia, numbness extremities, foot ulcers/trauma   PERTINENT  PMH / PSH: ***  OBJECTIVE:   There were no vitals taken for this visit.   General: NAD, pleasant, able to participate in exam Cardiac: RRR, no murmurs. Respiratory: CTAB, normal effort, No wheezes, rales or rhonchi Abdomen: Bowel sounds present, nontender, nondistended, no hepatosplenomegaly. Extremities: no edema or cyanosis. Skin: warm and dry, no rashes noted Neuro: alert, no obvious focal deficits Psych: Normal affect and mood  ASSESSMENT/PLAN:   No problem-specific Assessment & Plan notes found for this encounter.     Sabino Dick, DO [facility]

## 2022-12-28 ENCOUNTER — Ambulatory Visit: Payer: 59 | Admitting: Family Medicine

## 2023-01-07 ENCOUNTER — Ambulatory Visit: Payer: 59 | Admitting: Pharmacist

## 2023-01-12 ENCOUNTER — Telehealth: Payer: Self-pay

## 2023-01-17 ENCOUNTER — Ambulatory Visit (INDEPENDENT_AMBULATORY_CARE_PROVIDER_SITE_OTHER): Payer: 59 | Admitting: Pharmacist

## 2023-01-17 ENCOUNTER — Encounter: Payer: Self-pay | Admitting: Pharmacist

## 2023-01-17 VITALS — BP 115/82 | HR 105 | Ht 65.0 in | Wt 114.0 lb

## 2023-01-17 DIAGNOSIS — E1140 Type 2 diabetes mellitus with diabetic neuropathy, unspecified: Secondary | ICD-10-CM

## 2023-01-17 DIAGNOSIS — E1165 Type 2 diabetes mellitus with hyperglycemia: Secondary | ICD-10-CM | POA: Diagnosis not present

## 2023-01-17 DIAGNOSIS — Z720 Tobacco use: Secondary | ICD-10-CM

## 2023-01-17 DIAGNOSIS — Z794 Long term (current) use of insulin: Secondary | ICD-10-CM

## 2023-01-17 MED ORDER — BASAGLAR KWIKPEN 100 UNIT/ML ~~LOC~~ SOPN
22.0000 [IU] | PEN_INJECTOR | SUBCUTANEOUS | 11 refills | Status: DC
Start: 2023-01-17 — End: 2023-02-18

## 2023-01-17 MED ORDER — FIASP FLEXTOUCH 100 UNIT/ML ~~LOC~~ SOPN
4.0000 [IU] | PEN_INJECTOR | Freq: Three times a day (TID) | SUBCUTANEOUS | 11 refills | Status: DC
Start: 2023-01-17 — End: 2023-02-18

## 2023-01-17 MED ORDER — DULOXETINE HCL 60 MG PO CPEP: 60.0000 mg | ORAL_CAPSULE | Freq: Every day | ORAL | 11 refills | Status: DC

## 2023-01-17 NOTE — Progress Notes (Signed)
Reviewed and agree with Dr Koval's plan.   

## 2023-01-17 NOTE — Assessment & Plan Note (Signed)
Tobacco Use Disorder: Patient reports cutting back to 3 cigarettes per day, smoking only half a cigarette at a time.  -Encouraged patient to meet goal quit date 01/29/2023 -Consider discussion / initiation of medication to assist with cessation at next visit if tobacco use continues.

## 2023-01-17 NOTE — Progress Notes (Signed)
S:     Chief Complaint  Patient presents with   Medication Management    Diabetes - Neuropathy - Tobacco Intake Reduction   52 y.o. male who presents for diabetes evaluation, education, and management.  PMH is significant for T2DM, depression, peripheral neuropathy Patient was referred and last seen by Primary Care Provider, Dr. Melba Coon, on 11/08/22.   At last visit on 12/24/2022, decreased long-acting insulin Basaglar (insulin glargine) to 24 units daily. Decreased rapid acting Fiasp (insulin aspart) to 4-6 units with meals. Decreased metformin to 500 mg twice daily due to GI intolerability.  Today, patient arrives in good spirits and presents without any assistance.   Diabetes was diagnosed in 2021 per problem list.  Current diabetes medications include: Basaglar Kwikpen (insulin glargine) 24 units daily, Fiasp (insulin aspart) 4-6 units TID with meals.  Current hyperlipidemia medications include: rosuvastatin 10 mg daily  Patient reports adherence to taking all medications as prescribed. Reports running out of Cymbalta (duloxetine) 60 mg 3-4 days ago.  Do you feel that your medications are working for you? yes Have you been experiencing any side effects to the medications prescribed? Yes - GI intolerance with metformin despite decreased dose. Do you have any problems obtaining medications due to transportation or finances? Yes - Cost issues with medications, continuous glucose sensors Insurance coverage: [employer], [place]  Patient denies hypoglycemic events.  Patient reports nocturia (nighttime urination).  Patient reports neuropathy (nerve pain).  Patient reports visual changes. Patient denies self foot exams.  Patient reports lethargy.  Patient reported dietary habits: Eats 1 large meal/day. Reports he only takes insulin aspart with this meal.   O:   Review of Systems  Gastrointestinal:  Positive for diarrhea and nausea.  Musculoskeletal:  Positive for back pain.   All other systems reviewed and are negative.   Physical Exam Constitutional:      Appearance: Normal appearance.  Pulmonary:     Effort: Pulmonary effort is normal.  Neurological:     Mental Status: He is alert.  Psychiatric:        Mood and Affect: Mood normal.        Behavior: Behavior normal.        Thought Content: Thought content normal.        Judgment: Judgment normal.     7 day average blood glucose: 174 mg/dL  10/13/863 CGM Download:  % Time CGM is active: 52% Average Glucose: 174 mg/dL Glucose Management Indicator: 7.5%  Glucose Variability: 50.8% (goal <36%) Time in Goal:  - Time in range 70-180: 58% - Time above range: 38% - Time below range: 4% Observed patterns:   Lab Results  Component Value Date   HGBA1C 7.9 (A) 11/08/2022   Vitals:   01/17/23 0907  BP: 115/82  Pulse: (!) 105  SpO2: 100%    Lipid Panel     Component Value Date/Time   CHOL 171 05/17/2022 1526   TRIG 351 (H) 05/17/2022 1526   HDL 53 05/17/2022 1526   CHOLHDL 3.2 05/17/2022 1526   CHOLHDL 5.2 01/25/2020 1308   VLDL 54 (H) 01/25/2020 1308   LDLCALC 64 05/17/2022 1526    Clinical Atherosclerotic Cardiovascular Disease (ASCVD): No  The 10-year ASCVD risk score (Arnett DK, et al., 2019) is: 13%   Values used to calculate the score:     Age: 58 years     Sex: Male     Is Non-Hispanic African American: Yes     Diabetic: Yes  Tobacco smoker: Yes     Systolic Blood Pressure: 115 mmHg     Is BP treated: No     HDL Cholesterol: 53 mg/dL     Total Cholesterol: 171 mg/dL   A/P: Diabetes since 1610 currently near control with last 7.9%. Patient states that spinal surgeon would like A1c <7% before procedure for back pain. Patient is able to verbalize appropriate hypoglycemia management plan. Medication adherence appears good when patient can afford prescriptions and food security. Control is suboptimal due to financial issues. -Decreased dose of basal insulin Basaglar (insulin  glargine) from 24 units to 22 units daily in the morning.  -Continued rapid insulin Fiasp (insulin aspart) at 4-6 units daily with plan to administer less insulin (3-4 units and more 5 units) based on blood sugar readings, type of food eaten, and planned activities. Patient verbalized understanding of rapid insulin plan.  -Discontinued metformin due to GI intolerance.  -Patient educated on purpose, proper use, and potential adverse effects of medications.  -Extensively discussed pathophysiology of diabetes, recommended lifestyle interventions, dietary effects on blood sugar control.  -Libre 3 sensors provided as samples due to patient's inability to pay.  -Counseled on s/sx of and management of hypoglycemia.  -Next A1c anticipated June 2024.   Neuropathy worsened after he ran out of his duloxetine - reported as 4 days prior.  Patient requested refill.  - Refilled previously helpful duloxetine 60mg  daily  ASCVD risk - primary prevention in patient with diabetes. Last LDL is 64 mg/dL at goal of <96  mg/dL.  -Continued rosuvastatin 10 mg.   Tobacco Use Disorder: Patient reports cutting back to 3 cigarettes per day, smoking only half a cigarette at a time.  -Encouraged patient to meet goal quit date 01/29/2023 -Consider discussion / initiation of medication to assist with cessation at next visit if tobacco use continues.   Neuropathy: Partially controlled on duloxetine, though patient reports he is out of refills this week.  -Provided refill for Cymbalta (duloxetine) 60 mg for neuropathy. -Plan to follow-up with PCP in June regarding optimal neuropathy control.  Written patient instructions provided. Patient verbalized understanding of treatment plan.  Total time in face to face counseling 27 minutes.    Follow-up:  PCP clinic visit in 6/21 at 11:30 AM.  Patient seen with Haze Boyden PharmD Candidate.

## 2023-01-17 NOTE — Patient Instructions (Addendum)
It was nice to see you today!  Your goal blood sugar is 80-130 before eating and less than 180 after eating.  Medication Changes: Continue all medications.  Begin taking rapid insulin (Fiasp) 4-6 units depending on blood sugar reading. Use 3 units if you plan to be active or eat smaller meals.  Decrease long-acting insulin (Basaglar) to 22 units daily.  Stop taking metformin.  Monitor blood sugars at home and keep a log (glucometer or piece of paper) to bring with you to your next visit.  Keep up the good work with diet and exercise. Aim for a diet full of vegetables, fruit and lean meats (chicken, [place], fish). Try to limit salt intake by eating fresh or frozen vegetables (instead of canned), rinse canned vegetables prior to cooking and do not add any additional salt to meals.   Keep up the good work with quitting smoking! Your goal quit date is June 1.

## 2023-01-17 NOTE — Assessment & Plan Note (Signed)
Diabetes since 2021 currently near control with last 7.9%. Patient states that spinal surgeon would like A1c <7% before procedure for back pain. Patient is able to verbalize appropriate hypoglycemia management plan. Medication adherence appears good when patient can afford prescriptions and food security. Control is suboptimal due to financial issues. -Decreased dose of basal insulin Basaglar (insulin glargine) from 24 units to 22 units daily in the morning.  -Continued rapid insulin Fiasp (insulin aspart) at 4-6 units daily with plan to administer less insulin (3-4 units and more 5 units) based on blood sugar readings, type of food eaten, and planned activities. Patient verbalized understanding of rapid insulin plan.  -Discontinued metformin due to GI intolerance.  -Patient educated on purpose, proper use, and potential adverse effects of medications.  -Extensively discussed pathophysiology of diabetes, recommended lifestyle interventions, dietary effects on blood sugar control.  -Counseled on s/sx of and management of hypoglycemia.  -Next A1c anticipated June 2024.

## 2023-01-19 ENCOUNTER — Ambulatory Visit: Payer: 59 | Admitting: Pharmacist

## 2023-02-11 NOTE — Progress Notes (Cosign Needed Addendum)
SUBJECTIVE:   CHIEF COMPLAINT / HPI:   Matthew Lynch is a 52 y.o. male who presents to the [facility] clinic today to discuss the following concerns:   Diabetes, Type 2 - Last A1c 7.9 in March  - Medications: Glargine 22 units daily, Fiasp 4 to 5 units 3 times daily before meals. Metformin was discontinued due to GI intolerance.  - Compliance: Good - Checking BG at home: Has CGM: Libre 3. Has had 8 lows in the last 90 days. The lowest he has seen was 54. Avg glucose 202 mg/dL in the last 90 days - Diet: Doesn't eat regular meals. Typically eats breakfast and sometimes diner.  - Exercise: Walks, trying to be more active  - Eye exam: Due, has options to call and schedule.  - Foot exam: UTD - Microalbumin: UTD and normal in January  - Statin: Rosuvastatin 10 mg daily - Diabetic Neuropathy: on Cymbalta 60 mg daily   PERTINENT  PMH / PSH: Anxiety and depression, tobacco use disorder  OBJECTIVE:   BP 107/79   Pulse 99   Ht 5\' 5"  (1.651 m)   Wt 111 lb 12.8 oz (50.7 kg)   SpO2 100%   BMI 18.60 kg/m    General: NAD, pleasant, able to participate in exam Cardiac: RRR, no murmurs. Respiratory: CTAB, normal effort, No wheezes, rales or rhonchi Abdomen: thin Psych: Normal affect and mood     02/18/2023   11:41 AM 11/08/2022   11:41 AM 09/07/2022    2:19 PM  Depression screen PHQ 2/9  Decreased Interest 3 3 3   Down, Depressed, Hopeless 3 3 3   PHQ - 2 Score 6 6 6   Altered sleeping 3 3 3   Tired, decreased energy 3 3 3   Change in appetite 3 3 3   Feeling bad or failure about yourself  3 3 3   Trouble concentrating 3 3 3   Moving slowly or fidgety/restless 2 3 3   Suicidal thoughts  0 0  PHQ-9 Score 23 24 24   Difficult doing work/chores Extremely dIfficult Extremely dIfficult    ASSESSMENT/PLAN:   1. Type 2 diabetes mellitus with hyperglycemia, with long-term current use of insulin (HCC) A1c 8.1, slightly worse than last time when it was 7.9. Reviewed his Josephine Igo 3 numbers with Dr.  Raymondo Band and he does have some nocturnal low sugars in the 50s. Given that he does not eat regular meals, will scale back his long-acting insulin and encourage more use of short-acting insulin with meals to correct those highs.  - HgB A1c - Insulin Glargine (BASAGLAR KWIKPEN) 100 UNIT/ML; Inject 20 Units into the skin every morning.  Dispense: 15 mL; Refill: 11 - insulin aspart (FIASP FLEXTOUCH) 100 UNIT/ML FlexTouch Pen; Inject 4-6 Units into the skin 3 (three) times daily before meals.  Dispense: 15 mL; Refill: 11 - Scheduled with Dr. Raymondo Band on 7/9 for follow up and further adjustments if necessary   2. Neuropathy due to type 2 diabetes mellitus (HCC) Affecting lower extremities and genital area. Improved and not as debilitating with cymbalta but he still reports some discomfort.  - Continue Cymablta 60 mg daily; no room to increase dose and did not have success with gabapentin in the past  3. Poor social situation Currently living with daughter but reports that she is moving on July 1st and he will not have housing at that time. He is unsure of his plans. He has not worked for several years. He has child support payments that he cannot afford and has  faced time in jail due to this. He does have a car and has food stamps. He has a phone but it can only make/receive calls on [employer]. He was amenable to referral to [facility] for financial and housing insecurity.  - AMB Referral to [facility] Coordinaton (ACO Patients)  4. Persistent depressive disorder PHQ-9 score elevated and he reports that his mood is largely affected by his social situation and chronic medical co morbidities. Reports no SI, "I could never do it". He is amenable to therapy at this time. Resources provided.     Sabino Dick, DO [place] [facility]

## 2023-02-11 NOTE — Patient Instructions (Addendum)
It was wonderful to see you today.  Please bring ALL of your medications with you to every visit.   Today we talked about:  Your A1c today was 8. Our goal is <7.  Decrease your Basaglar to 20 units daily. Take 4-6 units of the short acting insulin (aspart or fiasp) with each meal you eat. Follow up with Dr. Raymondo Band on 7/9 at 1030.   See below for counseling resources. I think this will be really good for you! Please try to call.  Therapy and Counseling Resources Most providers on this list will take Medicaid. Patients with commercial insurance or Medicare should contact their insurance company to get a list of in network providers.  [facility] (takes children) Location 1: [address], [address] [place], [place] [address] Location 2: [address] [place], [place] [address] [phone]   [facility] (spanish speaking therapist available)(habla espanol)(take medicare and medicaid)  [address], [place], [place] [address], [email or web address] [email or web address] [phone]  [facility]Psychiatry and Counseling [address] [place], [place] [address] [phone]  [facility]   [address] [place], [place] [address]      [phone]  [facility] (spanish available) [address]  [place], [place] [address] [phone]  [facility] (take [facility] and medicare) [address], [address]  [place], [place] [address]       [phone]     [facility] (virtual only) [phone]  Jovita Kussmaul [facility] [address], [place], [place] [phone]  [facility]:  [address] [employer] [place] [phone]  [facility]:  [address] [place] [place]ien[place] [phone]  [facility] (under & uninsured) [address]   [place] [phone]    [email or web address]    [facility] [address]   [place]    [phone]  [facility] -[address]     Phone:  [phone]     [facility]-  [address]  [phone]   [facility] [address]      [place], [place] [phone]  [facility]: [address] [place], [place], [place]  [phone]   [facility] (Spanish therapist) [email or web address]  [address] [place]  [address]   [place] [address]    [phone]    [facility]   [address],  [place], [place]  [phone]   [facility]  [address] [place]  [phone]  [facility]  [address] [place], [place]        [phone]  Open Access/Walk In Clinic under & uninsured  [facility]  [address] [place], [place] [phone] Crisis [phone]  [facility],  (Spanish)   [address], [place]: [phone]) 8:30 - 12; 1 - 2:30  [facility] [employer],  [address] [place], [place]    [phone]):8:30 - 12; 2 - 3PM  [facility] [employer],  [address],  [place]; [phone]):   Mon - Fri 8 AM - 5 PM  [facility] [address] [place]  MWF 12:30 to 3:00 or call to schedule an appointment  [phone]  Specific Provider options Psychology Today  [email or web address] click on find a therapist  enter  your zip code left side and select or tailor a therapist for your specific need.   [facility] Provider Directory [email or web address]  (Medicaid)   Follow all drop down to find a provider  Social Support program [facility] [place] [phone] or PhotoSolver.pl [address], [place], [place] Recovery support and educational   24- Hour Availability:   [facility]  [address] [place],  [place] [phone] Crisis [phone]  [facility] [phone]  [place] [facility]  [phone]   [facility]  [phone] (after hours)  Therapeutic Alternative/Mobile Crisis   [phone]  [place] National Suicide Hotline  [phone] Len Childs)  Call 911 or go to emergency room  [facility]  [phone]);  [place] and [employer]  [phone]); [place], [place], [place], [place], [place], [place], [place]   Thank you for coming to your visit as scheduled. We have had a large "no-show" problem lately, and this significantly limits our ability to see and care for patients. As a friendly reminder- if you cannot make your appointment please call to cancel. We do have a no show policy for those who do not cancel within 24 hours. Our policy is that if you miss or fail to cancel an appointment within 24 hours, 3 times in a 51-month period, you may be dismissed from our clinic.   Thank you for choosing [facility].   Please call [phone] with any questions about today's appointment.  Please be sure to schedule follow up at the front  desk before you leave today.   Matthew Dick, DO PGY-3 Family Medicine

## 2023-02-18 ENCOUNTER — Ambulatory Visit (INDEPENDENT_AMBULATORY_CARE_PROVIDER_SITE_OTHER): Payer: 59 | Admitting: Family Medicine

## 2023-02-18 ENCOUNTER — Telehealth: Payer: Self-pay | Admitting: *Deleted

## 2023-02-18 ENCOUNTER — Other Ambulatory Visit: Payer: Self-pay

## 2023-02-18 ENCOUNTER — Encounter: Payer: Self-pay | Admitting: Family Medicine

## 2023-02-18 VITALS — BP 107/79 | HR 99 | Ht 65.0 in | Wt 111.8 lb

## 2023-02-18 DIAGNOSIS — Z794 Long term (current) use of insulin: Secondary | ICD-10-CM

## 2023-02-18 DIAGNOSIS — F341 Dysthymic disorder: Secondary | ICD-10-CM | POA: Diagnosis not present

## 2023-02-18 DIAGNOSIS — Z609 Problem related to social environment, unspecified: Secondary | ICD-10-CM

## 2023-02-18 DIAGNOSIS — E1165 Type 2 diabetes mellitus with hyperglycemia: Secondary | ICD-10-CM

## 2023-02-18 DIAGNOSIS — E1140 Type 2 diabetes mellitus with diabetic neuropathy, unspecified: Secondary | ICD-10-CM

## 2023-02-18 LAB — POCT GLYCOSYLATED HEMOGLOBIN (HGB A1C): HbA1c, POC (controlled diabetic range): 8.1 % — AB (ref 0.0–7.0)

## 2023-02-18 MED ORDER — FIASP FLEXTOUCH 100 UNIT/ML ~~LOC~~ SOPN
4.0000 [IU] | PEN_INJECTOR | Freq: Three times a day (TID) | SUBCUTANEOUS | 11 refills | Status: DC
Start: 2023-02-18 — End: 2023-02-18

## 2023-02-18 MED ORDER — FIASP FLEXTOUCH 100 UNIT/ML ~~LOC~~ SOPN
4.0000 [IU] | PEN_INJECTOR | Freq: Three times a day (TID) | SUBCUTANEOUS | 11 refills | Status: DC
Start: 2023-02-18 — End: 2023-06-01

## 2023-02-18 MED ORDER — BASAGLAR KWIKPEN 100 UNIT/ML ~~LOC~~ SOPN: 20.0000 [IU] | PEN_INJECTOR | SUBCUTANEOUS | 11 refills | Status: DC

## 2023-02-18 MED ORDER — BASAGLAR KWIKPEN 100 UNIT/ML ~~LOC~~ SOPN: 22.0000 [IU] | PEN_INJECTOR | SUBCUTANEOUS | 11 refills | Status: DC

## 2023-02-18 NOTE — Progress Notes (Signed)
  Care Coordination  Outreach Note  02/18/2023 Name: Matthew Lynch MRN: [id number] DOB: 19-Apr-1971   Care Coordination Outreach Attempts: An unsuccessful telephone outreach was attempted today to offer the patient information about available care coordination services.  Follow Up Plan:  Additional outreach attempts will be made to offer the patient care coordination information and services.   Encounter Outcome:  No Answer  Burman Nieves, CCMA Care Coordination Care Guide Direct Dial: [phone]

## 2023-02-21 ENCOUNTER — Telehealth: Payer: Self-pay | Admitting: *Deleted

## 2023-02-21 NOTE — Telephone Encounter (Signed)
   Telephone encounter was:  Unsuccessful.  02/21/2023 Name: Matthew Lynch MRN: [id number] DOB: September 21, 1970  Unsuccessful outbound call made today to assist with:  Transportation Needs   Outreach Attempt:  1st Attempt  A HIPAA compliant voice message was left requesting a return call.  Instructed patient to call back at [phone].  Yehuda Mao Greenauer -[facility], Population Health [phone] [address] [place] , [place] [address] Email : [email or web address]

## 2023-02-22 ENCOUNTER — Telehealth: Payer: Self-pay | Admitting: *Deleted

## 2023-02-22 NOTE — Telephone Encounter (Signed)
   Telephone encounter was:  Unsuccessful.  02/22/2023 Name: Matthew Lynch MRN: [id number] DOB: 05/28/1971  Unsuccessful outbound call made today to assist with:  Transportation Needs   Outreach Attempt:  2nd Attempt  A HIPAA compliant voice message was left requesting a return call.  Instructed patient to call back at [phone].  Yehuda Mao Greenauer -[facility] [place], Population Health [phone] [address] [place] , [place] [address] Email : [email or web address]

## 2023-02-28 ENCOUNTER — Telehealth: Payer: Self-pay | Admitting: *Deleted

## 2023-02-28 NOTE — Telephone Encounter (Signed)
   Telephone encounter was:  Unsuccessful.  02/28/2023 Name: Matthew Lynch MRN: [id number] DOB: [birth year]  Unsuccessful outbound call made today to assist with:  Food Insecurity  Outreach Attempt:  3rd Attempt.  Referral closed unable to contact patient.  Patient hung up   Alois Cliche -[facility] , Population Health [phone] [address] [place] , [place] [address] Email : [email or web address]

## 2023-03-02 ENCOUNTER — Telehealth: Payer: Self-pay

## 2023-03-02 DIAGNOSIS — Z794 Long term (current) use of insulin: Secondary | ICD-10-CM

## 2023-03-02 NOTE — Telephone Encounter (Signed)
Attempted to contact patient for follow-up of "CGM issue"   Left HIPAA compliant voice mail requesting call back to direct phone: [phone]  Total time with patient call and documentation of interaction: 7 minutes.  Additional follow-up phone call planned: 2 days (we are closed tomorrow.

## 2023-03-02 NOTE — Telephone Encounter (Signed)
Patient LVM on nurse line requesting to speak with Dr. Raymondo Band.   He reports issues with his CGM.   I attempted to call patient back x2, however no answer.   Will forward to [place].

## 2023-03-04 NOTE — Telephone Encounter (Signed)
Reviewed and agree with Dr Koval's plan.   

## 2023-03-04 NOTE — Telephone Encounter (Signed)
Patient returned call and stated he wanted a sample sensor because his copay for his libre 3 sensor was quoted to him to be > $20.   I contacted his pharmacy.  Asked them to try claim through Medicaid.  Pharmacist attempted to resolve but was having a challenge.  He believed he could resolve the issue.    I contacted patient for follow-up and left message for him to re-contact his pharmacy within the next hour and determine if the sensor issue cost was resolved.   I shared that we could help with sample sensor if the cost was not affordable but as the patient lives in [facility] [place] [facility], it is likely most cost effective for him to get low co-pay sensors near his residence.   Total time with patient call and documentation of interaction: 14 minutes.

## 2023-03-04 NOTE — Telephone Encounter (Signed)
Attempted to contact patient for follow-up of CGM issue   Left HIPAA compliant voice mail requesting call back to direct phone: [phone]  Total time with patient call and documentation of interaction: 4 minutes.  Additional follow-up phone call planned: 3days

## 2023-03-07 ENCOUNTER — Other Ambulatory Visit (HOSPITAL_COMMUNITY): Payer: Self-pay

## 2023-03-07 NOTE — Addendum Note (Signed)
Addended by: Veronda Prude on: 03/07/2023 03:25 PM   Modules accepted: Orders

## 2023-03-07 NOTE — Telephone Encounter (Signed)
Received return call from pharmacy. They report that they are getting error message due to provider not being enrolled in Medicaid.   Dr. Melba Coon sent this prescription in on 02/18/23. Will send to preceptor (McDiarmid) to resend.

## 2023-03-07 NOTE — Telephone Encounter (Signed)
Patient calls nurse line in regards to Basaglar.   He reports the medication is still running through for ~34 dollars. He reports he can not afford this. He reports he is "supposed" to have a 4 dollar copay with Medicaid.   I see patient has two insurances with Medicaid being the secondary.   I called the pharmacy and they reported he would need to call [employer] and cancel his policy for Medicaid to pick up prescription.   I do believe that is not exactly true. I know we have done PAs in the past for Medicaid secondary.   Will forward to pharmacy.

## 2023-03-08 ENCOUNTER — Telehealth (INDEPENDENT_AMBULATORY_CARE_PROVIDER_SITE_OTHER): Payer: 59 | Admitting: Pharmacist

## 2023-03-08 ENCOUNTER — Ambulatory Visit: Payer: 59 | Admitting: Pharmacist

## 2023-03-08 ENCOUNTER — Other Ambulatory Visit: Payer: Self-pay

## 2023-03-08 DIAGNOSIS — E1165 Type 2 diabetes mellitus with hyperglycemia: Secondary | ICD-10-CM

## 2023-03-08 MED ORDER — DEXCOM G7 SENSOR MISC: 1.0000 | 11 refills | Status: DC

## 2023-03-08 NOTE — Telephone Encounter (Signed)
Called patient.   Patient reports he went to the pharmacy last night and was told the prescription was still 34 dollars.  He reported to them he did not have the money and he reports "they just gave it to me free of charge." He reports they said they were hopeful to have "everything" figured out by next refill.   Unsure if attending sending in will fix the issue.   But, patient has adequate supply as of now.

## 2023-03-08 NOTE — Telephone Encounter (Signed)
Patient called at 9:27 AM and left message that he was anticipating a phone visit or call back.   He did not come to his scheduled appointment at 10:30 AM today.  When I shared this information patient felt like he could have made the appointment.   He has NOT had any libre 3 CGM due to his pharmacy being unable to obtain for him through [place] or Medicaid at a price less than $37  As he has Community education officer we discussed trying to use DEXCOM as his CGM of choice.  I asked him to pick up this sensor if possible prior to rescheduled visit in two days (03/10/2023).   He thanked me the the call back and appreciated the follow-up.      I also contacted his pharmacy and asked them to process claim for Dexcom G7 CGM - with override potentially to medicaid for lower co-pay.    Following phone call to [facility] Appears that he will have a $0 copay for the Dexcom G7 sensor.    Plan to initiate this at the 03/10/2023 visit.

## 2023-03-09 NOTE — Telephone Encounter (Signed)
Reviewed and agree with Dr Koval's plan.   

## 2023-03-10 ENCOUNTER — Encounter: Payer: Self-pay | Admitting: Pharmacist

## 2023-03-10 ENCOUNTER — Ambulatory Visit (INDEPENDENT_AMBULATORY_CARE_PROVIDER_SITE_OTHER): Payer: 59 | Admitting: Pharmacist

## 2023-03-10 VITALS — BP 118/83 | HR 92 | Wt 110.0 lb

## 2023-03-10 DIAGNOSIS — Z720 Tobacco use: Secondary | ICD-10-CM | POA: Diagnosis not present

## 2023-03-10 DIAGNOSIS — E1140 Type 2 diabetes mellitus with diabetic neuropathy, unspecified: Secondary | ICD-10-CM | POA: Diagnosis not present

## 2023-03-10 NOTE — Patient Instructions (Signed)
It was a pleasure seeing you today!  Please remember the following about your [facility] G7 Sensor 1. Sensor will last 10 days 2. Transmitter must be within 20 feet of receiver/cell phone. 3. If using Dexcom G7 app on cell phone, please remember to keep app open (do not close out of app). 4. Do a fingerstick blood glucose test if the sensor readings do not match how you feel 5. Remove sensor prior to magnetic resonance imaging (MRI), computed tomography (CT) scan, or high-frequency electrical heat  diathermy) treatment. 6. Do not allow sun screen or insect repellant to come into contact with Dexcom G7. These skin care products may lead for the plastic used in the Dexcom G7 to crack. 7. Dexcom G7 may be worn through a Industrial/product designer. It may not be exposed to an advanced Imaging Technology (AIT) body scanner (also called a millimeter wave scanner) or the baggage x-ray machine. Instead, ask for hand-wanding or full-body pat-down and visual inspection.  8. Doses of acetaminophen (Tylenol) >1 gram every 6 hours may cause false high readings. 9. Store sensor kit between 36 and 86 degrees Farenheit. Can be refrigerated within this temperature range.   Hypoglycemia or low blood sugar:   Low blood sugar can happen quickly and may become an emergency if not treated right away.   While this shouldn't happen often, it can be brought upon if you skip a meal or do not eat enough. Also, if your insulin or other diabetes medications are dosed too high, this can cause your blood sugar to go to low.   Warning signs of low blood sugar include: Feeling shaky or dizzy Feeling weak or tired  Excessive hunger Feeling anxious or upset  Sweating even when you aren't exercising  What to do if I experience low blood sugar? Follow the Rule of 15 Check your blood sugar. If lower than 70, proceed to step 2.  Treat with 15 grams of fast acting carbs which is found in 3-4 glucose tablets. If none are available  you can try hard candy, 1 tablespoon of sugar or honey,4 ounces of fruit juice, or 6 ounces of REGULAR soda.  Re-check your sugar in 15 minutes. If it is still below 70, do what you did in step 2 again. If your blood sugar has come back up, go ahead and eat a snack or small meal made up of complex carbs (ex. Whole grains) and protein at this time to avoid recurrence of low blood sugar.  Ordering Overlay Patches 1. Go to the following website every 30 days to order new overlay patches:  [email or web address].[email or web address].co.uk  Problems with Dexcom sticking? 1. Order Skin Tac from [facility]. Alcohol swab area you plan to administer Dexcom then let dry. Once dry, apply Skin Tac in a circular motion (with a spot in the middle for sensor without skin tac) and let dry. Once dry you can apply Dexcom!  Problems taking off Dexcom? 1. Remember to try to shower before removing Dexcom 2. Order Tac Away to help remove any extra adhesive left on your skin once you remove [facility]  Dexcom Customer Service Information Customer Sales Support (dexcom orders and general customer questions) Phone number: [phone] Monday - Friday  6 AM - 5 PM PST Saturday 8 AM - 4 PM PST  *Contact if you do not receive overlay patches  2. Global Technical Support (product troubleshooting or replacement inquiries) Phone number: [phone] Available 24 hours a day; 7 days a week  *Contact if you  have a "bad" sensor. Remember to tell them you are wearing Dexcom on your stomach!  3. Dexcom Care (provides dexcom CGM training, software downloads, and tutorials) Phone number: [phone] Monday - Friday 6 AM - 5 PM PST Saturday 7 AM - 1:30 PM PST (All hours subject to change)  4. Website: https://www.dexcom.com/  Please do the following:  If you have any questions or if you believe something has occurred because of this change, call me or your doctor to let one of [place] know.  Continue making the lifestyle  changes we've discussed together during our visit. Diet and exercise play a significant role in improving your blood sugars.

## 2023-03-10 NOTE — Progress Notes (Signed)
Reviewed and agree with Dr Koval's plan.   

## 2023-03-10 NOTE — Progress Notes (Signed)
S:     Chief Complaint  Patient presents with   Medication Management    Diabetes - Dexcom G7 set-up   52 y.o. male who presents for diabetes evaluation, education, and management.  He has insurance that prefers DEXCOM and we scheduled this visit to focus on Dexcom G7 initiation.  PMH is significant for T2DM, depression, peripheral neuropathy Patient was referred and last seen by Primary Care Provider, Dr. Melba Coon, on 02/18/23.    At last contact by phone we discussed using rapid acting Fiasp (insulin aspart) at a dose of 2-3 units prior to meals due to multiple low readings.  Stopped metformin.      Today, patient arrives late in good spirits and presents without any assistance.  He was seen after a ~ 30 minute wait due to other patients being on time.    Diabetes was diagnosed in 2021 per problem list.   Current diabetes medications include: Basaglar Kwikpen (insulin glargine) 20 units daily, Fiasp (insulin aspart) 2-3 units TID with meals.  Current hyperlipidemia medications include: rosuvastatin 10 mg daily - only intermittently taking.    Patient reports adherence to taking most medications as prescribed. Reports taking rosuvastatin less then daily.  He agreed to try and take daily.    Do you feel that your medications are working for you? yes Insurance coverage: [place], [place]   Patient denies hypoglycemic events.   Patient reports continued nocturia (nighttime urination).  Patient reports continued neuropathy (nerve pain).  Patient reports lethargy.  Reports eating multiple smaller meals recently.  He is motivated to improve control so that he can qualify for surgery.  Surgeon prefers A1C to be < 7.0   O:   Review of Systems  Musculoskeletal:  Positive for joint pain.  Neurological:  Positive for sensory change.    Physical Exam Vitals reviewed.  Constitutional:      Appearance: Normal appearance.  Pulmonary:     Effort: Pulmonary effort is normal.   Neurological:     Mental Status: He is alert.  Psychiatric:        Mood and Affect: Mood normal.        Thought Content: Thought content normal.        Judgment: Judgment normal.      Last Libre 3 CGM Download: 02/24/2023 % Time CGM is active: 99% Average Glucose: 189 mg/dL Glucose Management Indicator: 7.8  Glucose Variability: 41.8 (goal <36%) Time in Goal:  - Time in range 70-180: 50% - Time above range: 48% - Time below range: 2% Observed patterns:   Lab Results  Component Value Date   HGBA1C 8.1 (A) 02/18/2023   Vitals:   03/10/23 1433  BP: 118/83    Lipid Panel     Component Value Date/Time   CHOL 171 05/17/2022 1526   TRIG 351 (H) 05/17/2022 1526   HDL 53 05/17/2022 1526   CHOLHDL 3.2 05/17/2022 1526   CHOLHDL 5.2 01/25/2020 1308   VLDL 54 (H) 01/25/2020 1308   LDLCALC 64 05/17/2022 1526    Clinical Atherosclerotic Cardiovascular Disease (ASCVD): No  The 10-year ASCVD risk score (Arnett DK, et al., 2019) is: 13.6%   Values used to calculate the score:     Age: 40 years     Sex: Male     Is Non-Hispanic African American: Yes     Diabetic: Yes     Tobacco smoker: Yes     Systolic Blood Pressure: 118 mmHg     Is BP  treated: No     HDL Cholesterol: 53 mg/dL     Total Cholesterol: 171 mg/dL   Patient is participating in a Managed Medicaid Plan:  Yes   A/P: Diabetes since 2021 currently believed to have similar control to most recent with A1c ~ 8.0 Patient states that spinal surgeon would like A1c <7% before procedure for back pain. Patient is able to verbalize appropriate hypoglycemia management plan. Medication adherence appears good when patient can afford prescriptions and food security. Control is suboptimal due to financial issues. - Continued dose of basal insulin Basaglar (insulin glargine) at 20 units daily in the morning.  -Continued rapid insulin Fiasp (insulin aspart) at 2-3 units daily. Patient verbalized understanding of rapid insulin plan.   -Initiated Dexcom G7 application and sensor.   -Patient educated on purpose, proper use, and potential adverse effects of medications.  -Extensively discussed pathophysiology of diabetes, recommended lifestyle interventions, dietary effects on blood sugar control.  -Libre 3 sensors provided as samples due to patient's inability to pay.  -Counseled on s/sx of and management of hypoglycemia.  -Next A1c anticipated June 2024.    Neuropathy stable  - Continue duloxetine 60mg  daily   ASCVD risk - primary prevention in patient with diabetes. Last LDL is 64 mg/dL at goal of <40  mg/dL. Admits suboptimal adherence.  -Continued rosuvastatin 10 mg - encouraged patient to take this Daily.    Tobacco Use Disorder: Patient reports minimal smoking (no longer buying any cigarettes.  -Encouraged patient to meet goal complete cessation by focusing on success daily -Praised for success and encourage to continue.   Written patient instructions provided. Patient verbalized understanding of treatment plan.  Total time in face to face counseling 31 minutes.    Follow-up:  Pharmacist 3-4 weeks. PCP clinic visit in PRN.

## 2023-03-10 NOTE — Assessment & Plan Note (Signed)
Diabetes since 2021 currently believed to have similar control to most recent with A1c ~ 8.0 Patient states that spinal surgeon would like A1c <7% before procedure for back pain. Patient is able to verbalize appropriate hypoglycemia management plan. Medication adherence appears good when patient can afford prescriptions and food security. Control is suboptimal due to financial issues. - Continued dose of basal insulin Basaglar (insulin glargine) at 20 units daily in the morning.  -Continued rapid insulin Fiasp (insulin aspart) at 2-3 units daily. Patient verbalized understanding of rapid insulin plan.  -Initiated Dexcom G7 application and sensor.   -Patient educated on purpose, proper use, and potential adverse effects of medications.  -Extensively discussed pathophysiology of diabetes, recommended lifestyle interventions, dietary effects on blood sugar control.  -Libre 3 sensors provided as samples due to patient's inability to pay.  -Counseled on s/sx of and management of hypoglycemia.  -Next A1c anticipated June 2024.

## 2023-03-10 NOTE — Assessment & Plan Note (Signed)
Tobacco Use Disorder: Patient reports minimal smoking (no longer buying any cigarettes.  -Encouraged patient to meet goal complete cessation by focusing on success daily -Praised for success and encourage to continue.

## 2023-03-14 NOTE — Progress Notes (Signed)
  Care Coordination   Note   03/14/2023 Name: Chaysen Tillman MRN: [id number] DOB: 09/21/1970  Jammie Clink is a 52 y.o. year old male who sees [name], MD for primary care. I reached out to [name] by phone today to offer care coordination services.  Mr. Hollern was given information about Care Coordination services today including:   The Care Coordination services include support from the care team which includes your Nurse Coordinator, Clinical Social Worker, or Pharmacist.  The Care Coordination team is here to help remove barriers to the health concerns and goals most important to you. Care Coordination services are voluntary, and the patient may decline or stop services at any time by request to their care team member.   Care Coordination Consent Status: Patient agreed to services and verbal consent obtained.   Follow up plan:  Telephone appointment with care coordination team member scheduled for:  03/17/2023  Encounter Outcome:  Pt. Scheduled  Burman Nieves, CCMA Care Coordination Care Guide Direct Dial: [phone]

## 2023-03-17 ENCOUNTER — Ambulatory Visit: Payer: Self-pay

## 2023-03-17 NOTE — Patient Instructions (Signed)
Visit Information  Thank you for taking time to visit with me today. Please don't hesitate to contact me if I can be of assistance to you.   Following are the goals we discussed today:   Goals Addressed             This Visit's Progress    Care Coordination Activites       Care Coordination Interventions: Housing-patient has been homeless for 2 years and is staying with his daughter who is moving on 7/23.  Patient will ask a cousin if he can stay 1-2 weeks while he works on a plan.  Patient has not connected with homeless services in the community.  Patient agreed to contact [facility], [facility], Arch[employer] to inquire on shelter, case management, assistance with disability appeal and daily needs. Cell phone-patient will contact local cell phone carriers to obtain a free government cell phone. Income-patient will contact Social Security Administration to appeal his recent denial for disability benefits.   Food-patient visit [employer] daily to eat, which will allow foodstamps to last longer.        Our next appointment is by telephone on 03/29/23 at 11am  Please call the care guide team at [phone] if you need to cancel or reschedule your appointment.   If you are experiencing a Mental Health or Behavioral Health Crisis or need someone to talk to, please call 911  Patient verbalizes understanding of instructions and care plan provided today and agrees to view in MyChart. Active MyChart status and patient understanding of how to access instructions and care plan via MyChart confirmed with patient.     Telephone follow up appointment with care management team member scheduled for:03/29/23 at 11am.  Lysle Morales, BSW Social Worker [facility] Care Management  [phone]

## 2023-03-17 NOTE — Patient Outreach (Signed)
  Care Coordination   Initial Visit Note   03/17/2023 Name: Matthew Lynch MRN: [id number] DOB: 1970/12/02  Matthew Lynch is a 52 y.o. year old male who sees Matthew Mola, MD for primary care. I spoke with  Matthew Lynch by phone today.  What matters to the patients health and wellness today?  Patient wants resources for homelessness.  Patient has no income other than Foodstamps.  His disability application was denied.     Goals Addressed             This Visit's Progress    Care Coordination Activites       Care Coordination Interventions: Housing-patient has been homeless for 2 years and is staying with his daughter who is moving on 7/23.  Patient will ask a cousin if he can stay 1-2 weeks while he works on a plan.  Patient has not connected with homeless services in the community.  Patient agreed to contact [facility], [facility], Arch[facility] to inquire on shelter, case management, assistance with disability appeal and daily needs. Cell phone-patient will contact local cell phone carriers to obtain a free government cell phone. Income-patient will contact Social Security Administration to appeal his recent denial for disability benefits.   Food-patient visit [employer] daily to eat, which will allow foodstamps to last longer.        SDOH assessments and interventions completed:  Yes  SDOH Interventions Today    Flowsheet Row Most Recent Value  SDOH Interventions   Food Insecurity Interventions Other (Comment)  [Receives Foodstamps but has no other income]  Transportation Interventions --  [Has access to Medicaid transportation]  Utilities Interventions Intervention Not Indicated        Care Coordination Interventions:  Yes, provided   Interventions Today    Flowsheet Row Most Recent Value  General Interventions   General Interventions Discussed/Reviewed General Interventions Discussed, Community Resources  [SW refers patient to [facility], [facility], cell phone company, Social Security Admin and [employer]]        Follow up plan: Follow up call scheduled for 03/29/23 at 11am    Encounter Outcome:  Pt. Visit Completed

## 2023-03-29 ENCOUNTER — Ambulatory Visit: Payer: Self-pay

## 2023-03-29 NOTE — Patient Outreach (Signed)
  Care Coordination   03/29/2023 Name: Gerone Grossheim MRN: [id number] DOB: 1970-11-21   Care Coordination Outreach Attempts:  An unsuccessful telephone outreach was attempted for a scheduled appointment today.  Follow Up Plan:  Additional outreach attempts will be made to offer the patient care coordination information and services.   Encounter Outcome:  No Answer   Care Coordination Interventions:  No, not indicated    SIG Lysle Morales, BSW Social Worker [facility] Care Management  [phone]

## 2023-04-12 ENCOUNTER — Ambulatory Visit (INDEPENDENT_AMBULATORY_CARE_PROVIDER_SITE_OTHER): Payer: 59 | Admitting: Pharmacist

## 2023-04-12 ENCOUNTER — Encounter: Payer: Self-pay | Admitting: Pharmacist

## 2023-04-12 VITALS — BP 96/76 | HR 94 | Ht 65.5 in | Wt 102.8 lb

## 2023-04-12 DIAGNOSIS — E1165 Type 2 diabetes mellitus with hyperglycemia: Secondary | ICD-10-CM | POA: Diagnosis not present

## 2023-04-12 DIAGNOSIS — Z794 Long term (current) use of insulin: Secondary | ICD-10-CM

## 2023-04-12 MED ORDER — BASAGLAR KWIKPEN 100 UNIT/ML ~~LOC~~ SOPN
20.0000 [IU] | PEN_INJECTOR | SUBCUTANEOUS | 11 refills | Status: DC
Start: 2023-04-12 — End: 2023-07-06

## 2023-04-12 MED ORDER — TRIAMCINOLONE ACETONIDE 0.5 % EX OINT: 1.0000 | TOPICAL_OINTMENT | Freq: Two times a day (BID) | CUTANEOUS | 1 refills | Status: AC

## 2023-04-12 NOTE — Patient Instructions (Addendum)
It was nice to see you today!  Your goal blood sugar is 80-130 before eating and less than 180 after eating.  Medication Changes: Start applying triamcinolone 0.5% ointment two times daily.  Continue Fiasp Flextouch (insulin aspart) to 3-4 units into the skin two to three times daily before meals.  Continue all other medication the same.   Keep up the good work with diet and exercise. Aim for a diet full of vegetables, fruit and lean meats (chicken, [place], fish). Try to limit salt intake by eating fresh or frozen vegetables (instead of canned), rinse canned vegetables prior to cooking and do not add any additional salt to meals.

## 2023-04-12 NOTE — Progress Notes (Signed)
S:     Chief Complaint  Patient presents with   Medication Management   Diabetes Management Plan   52 y.o. male who presents for diabetes evaluation, education, and management. Patient arrives in good spirits and presents without any assistance.  Patient was referred and last seen by Primary Care Provider, Dr. Melba Coon, on 02/18/23.   PMH is significant for T2DM, depression, peripheral neuropathy. At last visit, initiated Dexcom G7 application and sensor.    Patient reports Diabetes was diagnosed in 2021 per problem list.  Current diabetes medications include: Basaglar Kwikpen (insulin glargine) 20 units daily, Fiasp (insulin aspart) 2-3 units TID with meals.  Current hypertension medications include: rosuvastatin 10 mg daily   Patient denies adherence with medications, reports missing medications 4-5 times per week, on average. Poor adherence possibly due to patient experiencing homelessness and being alone.  Do you feel that your medications are working for you? yes Have you been experiencing any side effects to the medications prescribed? yes Do you have any problems obtaining medications due to transportation or finances? yes Insurance coverage: [place], [place]   Patient denies hypoglycemic events.  Patient reports nocturia (nighttime urination).  Patient reports neuropathy (nerve pain). Patient reports visual changes. Patient reports lethargy.  Within the past 12 months, did you worry whether your food would run out before you got money to buy more? yes Within the past 12 months, did the food you bought run out, and you didn't have money to get more? Yes  Patient came in with bilateral rashes on elbows. Dr. Deirdre Priest came into examine - contact dermatitis was interpretation.  Started patient on Kenalog (triamcinolone) 0.5% ointment to be applied topically twice daily. Follow up with Dr. Velna Ochs on 04/26/23.  O:   Review of Systems  Musculoskeletal:  Positive for  back pain.  Skin:  Positive for itching and rash.  Neurological:  Positive for dizziness and weakness.    Physical Exam Constitutional:      Appearance: Normal appearance. He is normal weight.  Skin:    Findings: Rash present.     Comments: Bilateral elbows  Neurological:     Mental Status: He is alert.  Psychiatric:        Mood and Affect: Mood normal.        Behavior: Behavior normal.        Thought Content: Thought content normal.        Judgment: Judgment normal.     7 day average blood glucose: 230 mg/dL  Dexcom CGM Download today on 04/12/23 % Time CGM is active: 86.7% Average Glucose: 230 mg/dL Glucose Management Indicator: 8.8%  Glucose Variability: 30.4% (goal <36%) Time in Goal:  - Time in range 70-180: 24% - Time above range: 76% - Time below range: 0% Observed patterns:   Lab Results  Component Value Date   HGBA1C 8.1 (A) 02/18/2023   Vitals:   04/12/23 1337  BP: 96/76  Pulse: 94  SpO2: 100%    Lipid Panel     Component Value Date/Time   CHOL 171 05/17/2022 1526   TRIG 351 (H) 05/17/2022 1526   HDL 53 05/17/2022 1526   CHOLHDL 3.2 05/17/2022 1526   CHOLHDL 5.2 01/25/2020 1308   VLDL 54 (H) 01/25/2020 1308   LDLCALC 64 05/17/2022 1526    Clinical Atherosclerotic Cardiovascular Disease (ASCVD): No  The 10-year ASCVD risk score (Arnett DK, et al., 2019) is: 9.5%   Values used to calculate the score:     Age:  52 years     Sex: Male     Is Non-Hispanic African American: Yes     Diabetic: Yes     Tobacco smoker: Yes     Systolic Blood Pressure: 96 mmHg     Is BP treated: No     HDL Cholesterol: 53 mg/dL     Total Cholesterol: 171 mg/dL   Patient is participating in a Managed Medicaid Plan:  Yes   A/P: Diabetes since 2021 currently struggling with homelessness, food insecurity and unemployment.  Overall, glucose control is slightly worse however it is noteworthy that patient has no documented low readings on CGM.  Patient is able to  verbalize appropriate hypoglycemia management plan. Medication adherence appears good when patient can afford prescriptions and food is accessble. Control is suboptimal due to financial issues.  - Continued dose of basal insulin Basaglar (insulin glargine) at 20 units daily in the morning.  - Continued rapid insulin Fiasp (insulin aspart) at 3-4 units daily (encouraged to use larger doses more often). Patient verbalized understanding of rapid insulin plan.  - Patient educated on purpose, proper use, and potential adverse effects of. - Extensively discussed pathophysiology of diabetes, recommended lifestyle interventions, dietary effects on blood sugar control.  - Spent > 10 minutes with Dexcom clarity application connectivity and linking with our practice.  -Counseled on s/sx of and management of hypoglycemia.  - Assisted with access to food.  Short-term supply of medications provided and patient encouraged to use QR code to identify additional sources of low cost/ free food.  ASCVD risk - primary prevention in patient with diabetes. Last LDL is 64 mg/dL not at goal of <66 mg/dL. Continued rosuvastatin 10 mg - encouraged patient to take this daily.  Written patient instructions provided. Patient verbalized understanding of treatment plan.  Total time in face to face counseling 36 minutes.    Follow-up:  Pharmacist PRN PCP clinic visit with Dr. Velna Ochs on 04/26/23. Patient seen with Andee Poles, PharmD Candidate.

## 2023-04-12 NOTE — Assessment & Plan Note (Signed)
Diabetes since 2021 currently struggling with homelessness, food insecurity and unemployment.  Overall, glucose control is slightly worse however it is noteworthy that patient has no documented low readings on CGM.  Patient is able to verbalize appropriate hypoglycemia management plan. Medication adherence appears good when patient can afford prescriptions and food is accessble. Control is suboptimal due to financial issues.  - Continued dose of basal insulin Basaglar (insulin glargine) at 20 units daily in the morning.  - Continued rapid insulin Fiasp (insulin aspart) at 3-4 units daily (encouraged to use larger doses more often). Patient verbalized understanding of rapid insulin plan.  - Patient educated on purpose, proper use, and potential adverse effects of. - Extensively discussed pathophysiology of diabetes, recommended lifestyle interventions, dietary effects on blood sugar control.  - Spent > 10 minutes with Dexcom clarity application connectivity and linking with our practice.  -Counseled on s/sx of and management of hypoglycemia.  - Assisted with access to food.  Short-term supply of medications provided and patient encouraged to use QR code to identify additional sources of low cost/ free food.  ASCVD risk - primary prevention in patient with diabetes. Last LDL is 64 mg/dL not at goal of <91 mg/dL. Continued rosuvastatin 10 mg - encouraged patient to take this daily.

## 2023-04-13 NOTE — Progress Notes (Signed)
Reviewed and agree with Dr Koval's plan.   

## 2023-04-25 NOTE — Progress Notes (Deleted)
    SUBJECTIVE:   CHIEF COMPLAINT / HPI:   Saw Dr. Raymondo Band 04/12/23. On 20 units Basaglar daily in AM, and Fiasp 3-4u daily. Last A1c 8.1.  PERTINENT  PMH / PSH: T2DM, Sciatica, HLD  OBJECTIVE:   There were no vitals taken for this visit.  ***  ASSESSMENT/PLAN:   There are no diagnoses linked to this encounter. No follow-ups on file.  Celine Mans, MD [facility]

## 2023-04-26 ENCOUNTER — Ambulatory Visit: Payer: 59 | Admitting: Family Medicine

## 2023-06-01 ENCOUNTER — Other Ambulatory Visit: Payer: Self-pay

## 2023-06-01 DIAGNOSIS — E1165 Type 2 diabetes mellitus with hyperglycemia: Secondary | ICD-10-CM

## 2023-06-03 MED ORDER — FIASP FLEXTOUCH 100 UNIT/ML ~~LOC~~ SOPN
4.0000 [IU] | PEN_INJECTOR | Freq: Three times a day (TID) | SUBCUTANEOUS | 11 refills | Status: DC
Start: 2023-06-03 — End: 2023-07-06

## 2023-06-06 ENCOUNTER — Ambulatory Visit: Payer: 59 | Admitting: Pharmacist

## 2023-06-07 ENCOUNTER — Encounter: Payer: Self-pay | Admitting: Pharmacist

## 2023-06-07 ENCOUNTER — Ambulatory Visit (INDEPENDENT_AMBULATORY_CARE_PROVIDER_SITE_OTHER): Payer: 59 | Admitting: Pharmacist

## 2023-06-07 ENCOUNTER — Ambulatory Visit (INDEPENDENT_AMBULATORY_CARE_PROVIDER_SITE_OTHER): Payer: 59 | Admitting: Student

## 2023-06-07 VITALS — BP 112/7 | HR 103 | Ht 66.3 in | Wt 108.8 lb

## 2023-06-07 VITALS — BP 112/74 | HR 103

## 2023-06-07 DIAGNOSIS — F419 Anxiety disorder, unspecified: Secondary | ICD-10-CM

## 2023-06-07 DIAGNOSIS — Z794 Long term (current) use of insulin: Secondary | ICD-10-CM | POA: Diagnosis not present

## 2023-06-07 DIAGNOSIS — F32A Depression, unspecified: Secondary | ICD-10-CM

## 2023-06-07 DIAGNOSIS — Z720 Tobacco use: Secondary | ICD-10-CM

## 2023-06-07 DIAGNOSIS — E1140 Type 2 diabetes mellitus with diabetic neuropathy, unspecified: Secondary | ICD-10-CM

## 2023-06-07 DIAGNOSIS — F339 Major depressive disorder, recurrent, unspecified: Secondary | ICD-10-CM | POA: Diagnosis not present

## 2023-06-07 NOTE — Progress Notes (Unsigned)
Im

## 2023-06-07 NOTE — Patient Instructions (Signed)
I am going to have our Child psychotherapist reach out to you ASAP. I also want you to know about the [facility]al [facility] on [address]. This is a resource for you IF you become suicidal wanting to hurt yourself or kill yourself.  Eliezer Mccoy, MD

## 2023-06-07 NOTE — Assessment & Plan Note (Signed)
Diabetes longstanding and currently with improved control despite high level stress including homelessness, and recent incarceration.  Patient is frustrated and reports episodes of suicidal ideation which was referred to work-in provider, Dr. Marisue Humble.  -Continued basal insulin Mariella Saa (insulin glargine) at 14  units daily in the morning.   -Continued rapid insulin Fiasp (insulin aspart) at 4-8 units prior to meals. 2-3 times per day.   - Follow-up plan created by Dr. Marisue Humble.

## 2023-06-07 NOTE — Patient Instructions (Signed)
It was nice to see you today!  Your goal blood sugar is 80-130 before eating and less than 180 after eating.  Medication Changes: Continue all other medication the same.   Monitor blood sugars at home and keep a log (glucometer or piece of paper) to bring with you to your next visit.  Keep up the good work with diet and exercise. Aim for a diet full of vegetables, fruit and lean meats (chicken, [place], fish). Try to limit salt intake by eating fresh or frozen vegetables (instead of canned), rinse canned vegetables prior to cooking and do not add any additional salt to meals.

## 2023-06-07 NOTE — Progress Notes (Signed)
S:     Chief Complaint  Patient presents with   Medication Management    Diabetes   52 y.o. male who presents for diabetes evaluation, education, and management. Patient arrives in very poor spirits and presents without any assistance. Recently incarcerated for 30 days and during this time was not using insulin, was given metformin starting 5 days in. Reporting bad pain in legs, back, (pain 10/10 today) Reports stopping smoking since release from incarceration ~10 days ago, but may smoke 1 cigarette during stressful times.   Reports lightheadedness/dizziness, and light sensitivity.   Patient was referred and last seen by Primary Care Provider, Dr. Melba Coon, on 02/18/2023.   PMH is significant for Diabetic neuropathy.   Family/Social History: Reports currently living in a variety of places and having variable food availability..  Current diabetes medications include: insulin aspart and insulin glargine. Current hyperlipidemia medications include: rosuvastatin 10 mg   Recently incarcerated and went without insulin, was given metformin, until release ~10 days ago. Reports adherence to insulin, nonadherent to rosuvastatin.  Do you have any problems obtaining medications due to transportation or finances? yes Insurance coverage: [place], [place] Medicaid  Patient reports intermittent hypoglycemic events.  Patient reports neuropathy (nerve pain).   Patient reported dietary habits: Eats 2-5 meals/day (breakfast, dinner) -- Goal of: no carbs, potatoes Breakfast: oatmeal, eggs, [place] sausage/bacon, grits Dinner: greens, beef, chicken Snacks: carrots, salads, fruits Drinks: water, drinks juice when dizziness/lightheadedness comes on  ~45% of time eating "whatever is available" -- canned goods, soups   Within the past 12 months, did you worry whether your food would run out before you got money to buy more? yes  O:   Review of Systems  Genitourinary:  Positive for dysuria and  frequency.  Neurological:  Positive for tingling and sensory change.  Psychiatric/Behavioral:  Positive for depression and suicidal ideas. The patient is nervous/anxious.     Physical Exam    Libre3  CGM Download onm 06/06/2023 % Time CGM is active: 72.6% Average Glucose: 184 mg/dL Glucose Management Indicator: 7.7  Glucose Variability: 35.4% (goal <36%) Time in Goal:  - Time in range 70-180: 48% (improved from 24%) - Time above range: 50% - Time below range: 2% Observed patterns:   Lab Results  Component Value Date   HGBA1C 8.1 (A) 02/18/2023   Vitals:   06/07/23 1546  BP: (!) 112/7  Pulse: (!) 103  SpO2: 100%    Lipid Panel     Component Value Date/Time   CHOL 171 05/17/2022 1526   TRIG 351 (H) 05/17/2022 1526   HDL 53 05/17/2022 1526   CHOLHDL 3.2 05/17/2022 1526   CHOLHDL 5.2 01/25/2020 1308   VLDL 54 (H) 01/25/2020 1308   LDLCALC 64 05/17/2022 1526     A/P: Diabetes longstanding and currently with improved control despite high level stress including homelessness, and recent incarceration.  Patient is frustrated and reports episodes of suicidal ideation which was referred to work-in provider, Dr. Marisue Humble.  -Continued basal insulin Mariella Saa (insulin glargine) at 14  units daily in the morning.   -Continued rapid insulin Fiasp (insulin aspart) at 4-8 units prior to meals. 2-3 times per day.   - Follow-up plan created by Dr. Marisue Humble.   Tobacco Use Disorder - Nearly complete cessation recently.  Continue to support efforts to remain tobacco free during stressful time.  Written patient instructions provided. Patient verbalized understanding of treatment plan.  Total time in face to face counseling 14 minutes.    Follow-up:  Pharmacist PRN PCP clinic visit in 10/11 Patient seen with Shona Simpson, PharmD Candidate.

## 2023-06-07 NOTE — Assessment & Plan Note (Signed)
Tobacco Use Disorder - Nearly complete cessation recently.  Continue to support efforts to remain tobacco free during stressful time.

## 2023-06-08 ENCOUNTER — Telehealth: Payer: Self-pay | Admitting: *Deleted

## 2023-06-08 NOTE — Progress Notes (Signed)
Reviewed and agree with Dr Koval's plan.   

## 2023-06-08 NOTE — Assessment & Plan Note (Signed)
Evaluated at the request of Dr. Raymondo Band due to depression and concern for suicidality. In our conversation today he denies any suicidal thoughts, plan, or intent. Rather he is just overwhelmed by his social circumstances. He needs care management assistance. - Patient contracts for safety - Gave him information about [facility] should he need it  - Thankfully he is an [facility] patient, will place urgent referral for care management services - Short interval follow-up with Dr. Marsh Dolly, PCP, on Friday

## 2023-06-08 NOTE — Progress Notes (Signed)
  Care Coordination   Note   06/08/2023 Name: Matthew Lynch MRN: [id number] DOB: [birth year]  Matthew Lynch is a 52 y.o. year old male who sees [name], MD for primary care. I reached out to [name] by phone today to offer care coordination services.  Matthew Lynch was given information about Care Coordination services today including:   The Care Coordination services include support from the care team which includes your Nurse Coordinator, Clinical Social Worker, or Pharmacist.  The Care Coordination team is here to help remove barriers to the health concerns and goals most important to you. Care Coordination services are voluntary, and the patient may decline or stop services at any time by request to their care team member.   Care Coordination Consent Status: Patient agreed to services and verbal consent obtained.   Follow up plan:  Telephone appointment with care coordination team member scheduled for:  06/09/23  Encounter Outcome:  Patient Scheduled [facility] Coordination Care Guide  Direct Dial: [phone]

## 2023-06-09 ENCOUNTER — Encounter: Payer: Self-pay | Admitting: *Deleted

## 2023-06-09 ENCOUNTER — Ambulatory Visit: Payer: Self-pay | Admitting: *Deleted

## 2023-06-10 ENCOUNTER — Ambulatory Visit (INDEPENDENT_AMBULATORY_CARE_PROVIDER_SITE_OTHER): Payer: 59 | Admitting: Family Medicine

## 2023-06-10 ENCOUNTER — Encounter: Payer: Self-pay | Admitting: Family Medicine

## 2023-06-10 ENCOUNTER — Other Ambulatory Visit: Payer: Self-pay

## 2023-06-10 VITALS — BP 105/67 | HR 99 | Ht 65.0 in | Wt 111.4 lb

## 2023-06-10 DIAGNOSIS — E1140 Type 2 diabetes mellitus with diabetic neuropathy, unspecified: Secondary | ICD-10-CM | POA: Diagnosis not present

## 2023-06-10 DIAGNOSIS — Z23 Encounter for immunization: Secondary | ICD-10-CM

## 2023-06-10 DIAGNOSIS — Z794 Long term (current) use of insulin: Secondary | ICD-10-CM | POA: Diagnosis not present

## 2023-06-10 DIAGNOSIS — F332 Major depressive disorder, recurrent severe without psychotic features: Secondary | ICD-10-CM | POA: Diagnosis not present

## 2023-06-10 LAB — POCT GLYCOSYLATED HEMOGLOBIN (HGB A1C): HbA1c, POC (controlled diabetic range): 8.9 % — AB (ref 0.0–7.0)

## 2023-06-10 MED ORDER — PREGABALIN 25 MG PO CAPS
25.0000 mg | ORAL_CAPSULE | Freq: Two times a day (BID) | ORAL | 0 refills | Status: DC
Start: 2023-06-10 — End: 2023-07-06

## 2023-06-10 NOTE — Patient Instructions (Signed)
It was wonderful to see you today.  Please bring ALL of your medications with you to every visit.   Today we talked about:  Neuropathy - Please try the lyrica and we will follow up on how it goes.  I do think therapy would help your mood and give you someone to talk to about your life problems.   Social work - Please let me know if there is anything I can do to help in terms of social work paper work Catering manager.   Thank you for choosing [facility].   Please call [phone] with any questions about today's appointment.   [name], MD  Family Medicine

## 2023-06-10 NOTE — Addendum Note (Signed)
Addended by: Buck Mam on: 06/10/2023 05:01 PM   Modules accepted: Orders

## 2023-06-10 NOTE — Patient Instructions (Signed)
Visit Information  Thank you for taking time to visit with me today. Please don't hesitate to contact me if I can be of assistance to you.   Following are the goals we discussed today:   Goals Addressed             This Visit's Progress    Get help with housing, work and other community support       Activities and task to complete in order to accomplish goals.   Seek info from your insurance providers for more information about your Enhanced Benefits Call to determine if your [employer] plan is active- call ) Follow up with housing resources discussed [facility] Housing Authority to apply for Section 8 housing [phone] [employer] Med[employer] Program: [phone]  Coordinated Entry (for persons experiencing homelessness) [phone] www.nchousingsearch.org Greater Dietitian (select "find help") - Find Food  [place], [place] (ghpfa.org) or download the free app to your smart phone Food and Nutrition Services - [phone] or apply online at [facility] - ePASS Start / continue relaxed breathing 3 times daily Keep all upcoming appointment discussed today Continue with compliance of taking medication prescribed by Doctor Self Support options  (review material emailed to you for resources : Voc Rehab, housing, etc) I have scheduled a phone appointment with the RN Care Manager she will provide educational informational and assist you with managing your health needs Call [phone] for 24/7 phone support from mental health specialist Call 911 for any emergent needs I have placed a referral to the community resource care guide they will call you for resource support         Our next appointment is by telephone on 06/16/23    Please call the care guide team at [phone] if you need to cancel or reschedule your appointment.   If you are experiencing a Mental Health or Behavioral Health Crisis or need someone to talk to, please call the Suicide and Crisis  Lifeline: 988 call 911   The patient verbalized understanding of instructions, educational materials, and care plan provided today and DECLINED offer to receive copy of patient instructions, educational materials, and care plan.   Telephone follow up appointment with care management team member scheduled for:06/16/23   Reece Levy, MSW, LCSW Clinical Social Worker [phone]

## 2023-06-10 NOTE — Progress Notes (Signed)
    SUBJECTIVE:   CHIEF COMPLAINT / HPI:   Diabetes  Patient is very distressed by his diabetes and says that it has taken over his life. He has neuropathy from his penis to his feet. Says he is very proud of himself for getting his A1c from 13 to 8. Says he feels pretty good about controlling his glucose based on readings from CGM.   Social Determinants of Health  Patient is very frustrated with his life situation and finds it very difficult to control his health while also not having many resources for the last 3 years. Says he recently got in touch with a Child psychotherapist and feels this may help.   Depression  Patient continues to have symptoms of depression, but attributes it to his social situation. Declines any psychiatric medications. He says he is not interested in therapy or more pills and feels he takes a lot of pills that do not do much.   PERTINENT  PMH / PSH: Diabetes, MDD,   OBJECTIVE:   BP 105/67   Pulse 99   Ht 5\' 5"  (1.651 m)   Wt 111 lb 6.4 oz (50.5 kg)   SpO2 100%   BMI 18.54 kg/m   General: Chronically ill appearing, in no acute distress CV: RRR, radial pulses equal and palpable Resp: Normal work of breathing on room air Psyc: blunted affect, guarded body expression, frustrated.    ASSESSMENT/PLAN:   Assessment & Plan Type 2 diabetes mellitus with diabetic neuropathy, without long-term current use of insulin (HCC) Slightly worsened control as A1c worsened from 8.5 to 8.9. Patient does not want to change any diabetes regimen at this time.  - Continue insulin and CGM.  - Follow up in one month  - Refer to Dr. Raymondo Band after next visit for optimization  - Start lyrica for neuropathy  - BMP, lipid panel   Severe episode of recurrent major depressive disorder, without psychotic features (HCC) Unfortunately quite intense depression; though, intertwined with poor social determinants of health.  - Declines medication  - Follow up situation with social worker  -  Follow up in one month       [name], MD [facility]

## 2023-06-10 NOTE — Patient Outreach (Signed)
Care Coordination   Initial Visit Note   06/10/2023 Name: Marvion Bastidas MRN: [id number] DOB: 1971/07/28  Gorge Almanza is a 52 y.o. year old male who sees [name], MD for primary care. I spoke with  [name] by phone today.  What matters to the patients health and wellness today?  Recently released from jail and needs help with housing, work, Catering manager.      Goals Addressed             This Visit's Progress    Get help with housing, work and other community support       Activities and task to complete in order to accomplish goals.   Seek info from your insurance providers for more information about your Enhanced Benefits Call to determine if your [employer] plan is active- call ) Follow up with housing resources discussed [facility] Housing Authority to apply for Section 8 housing [phone] [employer] Med[employer] Program: [phone]  Coordinated Entry (for persons experiencing homelessness) [phone] [phone] www.nchousingsearch.org Greater Dietitian (select "find help") - Find Food  [place], [place] (ghpfa.org) or download the free app to your smart phone Food and Nutrition Services - 551-693-7915 or apply online at [facility] - ePASS Start / continue relaxed breathing 3 times daily Keep all upcoming appointment discussed today Continue with compliance of taking medication prescribed by Doctor Self Support options  (review material emailed to you for resources : Voc Rehab, housing, etc) I have scheduled a phone appointment with the RN Care Manager she will provide educational informational and assist you with managing your health needs Call [phone] for 24/7 phone support from mental health specialist Call 911 for any emergent needs I have placed a referral to the community resource care guide they will call you for resource support         SDOH assessments and interventions completed:  Yes  SDOH Interventions Today    Flowsheet Row  Most Recent Value  SDOH Interventions   Food Insecurity Interventions Other (Comment)  [receives food stamps limited to cooking locations when staying in car]  Housing Interventions Other (Comment)  [referral to communityresources]  Transportation Interventions Intervention Not Indicated  [can't afford insurance, gas, etc]  Alcohol Usage Interventions Intervention Not Indicated (Score <7)  Depression Interventions/Treatment  Medication, Counseling  Financial Strain Interventions Other (Comment)  [link with resources]  Stress Interventions [employer], Provide Counseling          06/09/2023    9:23 AM 02/18/2023   11:41 AM 11/08/2022   11:41 AM 09/07/2022    2:19 PM 07/26/2022    1:46 PM  Depression screen PHQ 2/9  Decreased Interest 1 3 3 3 3   Down, Depressed, Hopeless 2 3 3 3 3   PHQ - 2 Score 3 6 6 6 6   Altered sleeping 2 3 3 3 3   Tired, decreased energy 2 3 3 3 3   Change in appetite 2 3 3 3 3   Feeling bad or failure about yourself  3 3 3 3 3   Trouble concentrating 3 3 3 3 3   Moving slowly or fidgety/restless 1 2 3 3 3   Suicidal thoughts 0 -- 0 0 3  PHQ-9 Score 16 23 24 24 27   Difficult doing work/chores Somewhat difficult Extremely dIfficult Extremely dIfficult       Care Coordination Interventions:  Yes, provided  Interventions Today    Flowsheet Row Most Recent Value  Chronic Disease   Chronic disease during today's visit  Diabetes  General Interventions   General Interventions Discussed/Reviewed General Interventions Discussed, General Interventions Reviewed, Psychiatrist Interventions   Education Provided Provided Education  Provided Verbal Education On Mental Health/Coping with Illness, [employer], [employer]  [Pt 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 Unsure if it is inactive- advised pt to call and  inquire- also discussed Medicaid plan should offer a lot  of support and resources as well.]  Mental Health Interventions   Mental Health Discussed/Reviewed Mental Health Discussed, Mental Health Reviewed, Coping Strategies, Refer to Social Work for resources  Refer to Social Work for resources regarding Other  [resources for food, housing, employment,  etc]  Pharmacy Interventions   Pharmacy Dicussed/Reviewed Pharmacy Topics Discussed  [Pt reports he has access to all his RX's]  Safety Interventions   Safety Discussed/Reviewed Safety Discussed  [Pt denies SI. Advised of phone line "[phone]"]       Follow up plan: Referral made to [facility] Follow up call scheduled for 06/16/23      Encounter Outcome:  Patient Visit Completed

## 2023-06-11 LAB — LIPID PANEL
Chol/HDL Ratio: 2.8 {ratio} (ref 0.0–5.0)
Cholesterol, Total: 172 mg/dL (ref 100–199)
HDL: 62 mg/dL (ref >39)
LDL Chol Calc (NIH): 92 mg/dL (ref 0–99)
Triglycerides: 100 mg/dL (ref 0–149)
VLDL Cholesterol Cal: 18 mg/dL (ref 5–40)

## 2023-06-11 LAB — BASIC METABOLIC PANEL
BUN/Creatinine Ratio: 13 (ref 9–20)
BUN: 11 mg/dL (ref 6–24)
CO2: 25 mmol/L (ref 20–29)
Calcium: 10.2 mg/dL (ref 8.7–10.2)
Chloride: 99 mmol/L (ref 96–106)
Creatinine, Ser: 0.82 mg/dL (ref 0.76–1.27)
Glucose: 141 mg/dL — ABNORMAL HIGH (ref 70–99)
Potassium: 4.6 mmol/L (ref 3.5–5.2)
Sodium: 139 mmol/L (ref 134–144)
eGFR: 107 mL/min/{1.73_m2} (ref >59)

## 2023-06-12 NOTE — Assessment & Plan Note (Signed)
Slightly worsened control as A1c worsened from 8.5 to 8.9. Patient does not want to change any diabetes regimen at this time.  - Continue insulin and CGM.  - Follow up in one month  - Refer to Dr. Raymondo Band after next visit for optimization  - Start lyrica for neuropathy  - BMP, lipid panel

## 2023-06-13 ENCOUNTER — Telehealth: Payer: Self-pay

## 2023-06-13 NOTE — Telephone Encounter (Signed)
Patient calls nurse line requesting to speak with Dr. Marsh Dolly regarding "some symptoms" he has been having.   Reports concerns of symptoms with UTI.  Straining to urinate and painful urination.   He reports having pain in penis related to neuropathy and has been overlooking his symptoms.   Advised that he would need appointment for further evaluation.   Scheduled for tomorrow morning at 10:10.   Veronda Prude, RN

## 2023-06-14 ENCOUNTER — Ambulatory Visit (INDEPENDENT_AMBULATORY_CARE_PROVIDER_SITE_OTHER): Payer: 59 | Admitting: Student

## 2023-06-14 ENCOUNTER — Telehealth: Payer: Self-pay

## 2023-06-14 VITALS — BP 98/58 | HR 100 | Wt 110.0 lb

## 2023-06-14 DIAGNOSIS — R1084 Generalized abdominal pain: Secondary | ICD-10-CM | POA: Diagnosis not present

## 2023-06-14 DIAGNOSIS — F419 Anxiety disorder, unspecified: Secondary | ICD-10-CM

## 2023-06-14 DIAGNOSIS — R636 Underweight: Secondary | ICD-10-CM

## 2023-06-14 DIAGNOSIS — F32A Depression, unspecified: Secondary | ICD-10-CM | POA: Diagnosis not present

## 2023-06-14 DIAGNOSIS — E1140 Type 2 diabetes mellitus with diabetic neuropathy, unspecified: Secondary | ICD-10-CM

## 2023-06-14 DIAGNOSIS — Z794 Long term (current) use of insulin: Secondary | ICD-10-CM | POA: Diagnosis not present

## 2023-06-14 LAB — POCT URINALYSIS DIP (MANUAL ENTRY)
Bilirubin, UA: NEGATIVE
Blood, UA: NEGATIVE
Glucose, UA: 250 mg/dL — AB
Leukocytes, UA: NEGATIVE
Nitrite, UA: NEGATIVE
Protein Ur, POC: NEGATIVE mg/dL
Spec Grav, UA: 1.015 (ref 1.010–1.025)
Urobilinogen, UA: 1 U/dL
pH, UA: 6 (ref 5.0–8.0)

## 2023-06-14 MED ORDER — DULOXETINE HCL 60 MG PO CPEP
120.0000 mg | ORAL_CAPSULE | Freq: Every day | ORAL | 1 refills | Status: DC
Start: 2023-06-14 — End: 2023-07-06

## 2023-06-14 NOTE — Patient Instructions (Addendum)
It was great to see you! Thank you for allowing me to participate in your care!   I recommend that you always bring your medications to each appointment as this makes it easy to ensure we are on the correct medications and helps [place] not miss when refills are needed.  Our plans for today:  - Take 120 mg of Duloxetine ( two tablets) daily for pain  We are checking some labs today, I will call you if they are abnormal will send you a MyChart message or a letter if they are normal.  If you do not hear about your labs in the next 2 weeks please let us know.  Take care and seek immediate care sooner if you develop any concerns. Please remember to show up 15 minutes before your scheduled appointment time!  Tiffany Kocher, DO [facility]

## 2023-06-14 NOTE — Telephone Encounter (Signed)
Patient presented today for ATC apt.   During apt he reported he has not been able to pick up Lyrica from his pharmacy. This medication was prescribed on 10/11.  I called the pharmacy for patient. Pharmacy reports a PA is needed.   Will forward to pharmacy to initiate.

## 2023-06-14 NOTE — Progress Notes (Signed)
    SUBJECTIVE:   CHIEF COMPLAINT / HPI:   Generalized abdominal pain Patient presents for evaluation of generalized abdominal pain.  He has been seen for this multiple times in the past, including evaluation by GI.  He reports fecal incontinence, diffuse pain poorly localized, poor appetite, fullness and " gas" after eating.  Feels he is not digesting his food adequately, reports eating undigested food particles in his stools.Unclear if he has had workup for gastroparesis. He has not taken the Lyrica that was prescribed last Friday for neuropathy.  Denies fevers, chills, cough, vomiting, chest pain, shortness of breath.  PERTINENT  PMH / PSH: Underweight, tobacco abuse, type 2 diabetes on insulin, anxiety and depression  OBJECTIVE:   BP (!) 98/58   Pulse 100   Wt 110 lb (49.9 kg)   SpO2 98%   BMI 18.30 kg/m    General: NAD, anxious appearing, underweight Cardio: RRR, no MRG. Cap Refill >2s. Respiratory: CTAB, normal wob on RA GI: Abdomen is soft, not tender, not distended. BS present. Skin: Warm and dry  ASSESSMENT/PLAN:   Assessment & Plan Generalized abdominal pain Differential is broad and includes: Neuropathy, gastroparesis, urinary tract infection, GERD, chronic mesenteric ischemia, somatic symptoms, cholecystitis, pancreatitis. On review of CGM, glucose consistently less than 250 (1 reading of 250 at presentation 2/2 drinking soda) and only trace ketones on UA-and with chronicity of symptoms I have lower suspicion for DKA/HHS however did counsel patient to seek care at emergency department if his blood glucose does not improve and he continues to feel ill.  He has not had CT imaging in over 3 years, he did have a EGD and colonoscopy this year that were both reassuring-but I do not believe that he has had any workup for gastroparesis.  May be reasonable to assess for chronic mesenteric ischemia given low weight and poor appetite. - UA, CBC, CMP, TSH - Increase Cymbalta to 120  mg daily for neuropathy and depression symptoms (recommend close monitoring) - Consider starting PPI - Consider CT angiogram and gastric emptying studies - Recommend return to GI - strict ED precautions discussed   Tiffany Kocher, DO [facility]

## 2023-06-14 NOTE — Progress Notes (Deleted)
    SUBJECTIVE:   CHIEF COMPLAINT / HPI:   ***  PERTINENT  PMH / PSH: ***  OBJECTIVE:   BP (!) 98/58   Pulse 100   Wt 110 lb (49.9 kg)   SpO2 98%   BMI 18.30 kg/m   ***  ASSESSMENT/PLAN:   No problem-specific Assessment & Plan notes found for this encounter.     Tiffany Kocher, DO [facility]

## 2023-06-14 NOTE — Assessment & Plan Note (Addendum)
Differential is broad and includes: Neuropathy, gastroparesis, urinary tract infection, GERD, chronic mesenteric ischemia, somatic symptoms, cholecystitis, pancreatitis. On review of CGM, glucose consistently less than 250 (1 reading of 250 at presentation 2/2 drinking soda) and only trace ketones on UA-and with chronicity of symptoms I have lower suspicion for DKA/HHS however did counsel patient to seek care at emergency department if his blood glucose does not improve and he continues to feel ill.  He has not had CT imaging in over 3 years, he did have a EGD and colonoscopy this year that were both reassuring-but I do not believe that he has had any workup for gastroparesis.  May be reasonable to assess for chronic mesenteric ischemia given low weight and poor appetite. - UA, CBC, CMP, TSH - Increase Cymbalta to 120 mg daily for neuropathy and depression symptoms (recommend close monitoring) - Consider starting PPI - Consider CT angiogram and gastric emptying studies - Recommend return to GI - strict ED precautions discussed

## 2023-06-15 LAB — COMPREHENSIVE METABOLIC PANEL
ALT: 31 [IU]/L (ref 0–44)
AST: 23 [IU]/L (ref 0–40)
Albumin: 4.7 g/dL (ref 4.1–5.1)
Alkaline Phosphatase: 79 [IU]/L (ref 44–121)
BUN/Creatinine Ratio: 9 (ref 9–20)
BUN: 8 mg/dL (ref 6–24)
Bilirubin Total: 0.7 mg/dL (ref 0.0–1.2)
CO2: 23 mmol/L (ref 20–29)
Calcium: 10 mg/dL (ref 8.7–10.2)
Chloride: 98 mmol/L (ref 96–106)
Creatinine, Ser: 0.85 mg/dL (ref 0.76–1.27)
Globulin, Total: 2.7 g/dL (ref 1.5–4.5)
Glucose: 241 mg/dL — ABNORMAL HIGH (ref 70–99)
Potassium: 4.2 mmol/L (ref 3.5–5.2)
Sodium: 137 mmol/L (ref 134–144)
Total Protein: 7.4 g/dL (ref 6.0–8.5)
eGFR: 106 mL/min/{1.73_m2} (ref >59)

## 2023-06-15 LAB — CBC WITH DIFFERENTIAL/PLATELET
Basophils Absolute: 0.1 10*3/uL (ref 0.0–0.2)
Basos: 1 %
EOS (ABSOLUTE): 0.6 10*3/uL — ABNORMAL HIGH (ref 0.0–0.4)
Eos: 7 %
Hematocrit: 47.7 % (ref 37.5–51.0)
Hemoglobin: 15.9 g/dL (ref 13.0–17.7)
Immature Grans (Abs): 0 10*3/uL (ref 0.0–0.1)
Immature Granulocytes: 0 %
Lymphocytes Absolute: 2.9 10*3/uL (ref 0.7–3.1)
Lymphs: 34 %
MCH: 30.4 pg (ref 26.6–33.0)
MCHC: 33.3 g/dL (ref 31.5–35.7)
MCV: 91 fL (ref 79–97)
Monocytes Absolute: 0.9 10*3/uL (ref 0.1–0.9)
Monocytes: 11 %
Neutrophils Absolute: 4 10*3/uL (ref 1.4–7.0)
Neutrophils: 47 %
Platelets: 378 10*3/uL (ref 150–450)
RBC: 5.23 x10E6/uL (ref 4.14–5.80)
RDW: 12.3 % (ref 11.6–15.4)
WBC: 8.6 10*3/uL (ref 3.4–10.8)

## 2023-06-15 LAB — TSH RFX ON ABNORMAL TO FREE T4: TSH: 1.62 u[IU]/mL (ref 0.450–4.500)

## 2023-06-15 NOTE — Telephone Encounter (Addendum)
PA submitted via covermymeds.   Key: [id number]

## 2023-06-16 ENCOUNTER — Ambulatory Visit: Payer: Self-pay | Admitting: *Deleted

## 2023-06-16 NOTE — Progress Notes (Signed)
Labs are normal or stable other than increased A1c to 8.9.

## 2023-06-16 NOTE — Patient Outreach (Signed)
  Care Coordination   Follow Up Visit Note   06/16/2023 Name: Matthew Lynch MRN: [id number] DOB: [birth year]  Matthew Lynch is a 52 y.o. year old male who sees [name], MD for primary care. I spoke with  Matthew Lynch by phone today.  What matters to the patients health and wellness today?  Starting a new medicine for his neuropathy.    Goals Addressed             This Visit's Progress    Get help with housing, work and other community support       Activities and task to complete in order to accomplish goals.   Seek info from your insurance providers for more information about your Enhanced Benefits Call to determine if your [employer] plan is active Reach out to [facility] (resent email)  Follow up with housing resources discussed [facility] Authority to apply for Section 8 housing [phone] [employer] Program: 412-568-4594  Coordinated Entry (for persons experiencing homelessness) 406-235-5742 www.nchousingsearch.org Greater Dietitian (select "find help") - Find Food  [place], [place] (ghpfa.org) or download the free app to your smart phone Food and Nutrition Services - 360 641 7880 or apply online at [facility] - ePASS Start / continue relaxed breathing 3 times daily Keep all upcoming appointment discussed today Continue with compliance of taking medication prescribed by Doctor Self Support options  (review material emailed to you for resources : [facility], housing, etc) Call 988 for 24/7 phone support from mental health specialist Call 911 for any emergent needs Expect a follow up call from our Managed Medicaid team for further support        SDOH assessments and interventions completed:  Yes     Care Coordination Interventions:  Yes, provided  Interventions Today    Flowsheet Row Most Recent Value  Chronic Disease   Chronic disease during today's visit Diabetes  General Interventions   General  Interventions Discussed/Reviewed General Interventions Discussed, Copy did not receive email sent with resources- will resend]  Education Interventions   Education Provided Provided Education  Provided Verbal Education On Mental Health/Coping with Illness, [employer], [employer], [employer] pt he is under [employer] plan and I will be referring him to our MM team for further support]  Nutrition Interventions   Nutrition Discussed/Reviewed Nutrition Discussed  [Provided pt with local food resources to consider]  Pharmacy Interventions   Pharmacy Dicussed/Reviewed Pharmacy Topics Discussed  [Pt received word while talking with CSW that his RX was approved for new medicine for neuropathy]       Follow up plan: Referral made to Managed Medicaid team for follow up (RNCM and SW)    Encounter Outcome:  Patient Visit Completed

## 2023-06-16 NOTE — Telephone Encounter (Signed)
Pharmacy has been updated.   Reports the medication is ready for pick up.   Patient aware.

## 2023-06-16 NOTE — Telephone Encounter (Signed)
Prior Auth for patients medication PREGABALIN approved by [facility] from 06/15/23 to 06/13/24.  CoverMyMeds Key: [id number] [place] Case ID #: [id number]

## 2023-06-16 NOTE — Patient Instructions (Signed)
Visit Information  Thank you for taking time to visit with me today. Please don't hesitate to contact me if I can be of assistance to you.   Following are the goals we discussed today:   Goals Addressed             This Visit's Progress    Get help with housing, work and other community support       Activities and task to complete in order to accomplish goals.   Seek info from your insurance providers for more information about your Enhanced Benefits Call to determine if your [employer] plan is active Reach out to [facility] (resent email)  Follow up with housing resources discussed [facility] Housing Authority to apply for Section 8 housing [phone] [employer] Program: [phone])439-4617  Coordinated Entry (for persons experiencing homelessness) 367-086-0080 www.nchousingsearch.org Greater Dietitian (select "find help") - Find Food  [place], [place] (ghpfa.org) or download the free app to your smart phone Food and Nutrition Services - 585-528-1965 or apply online at [facility] - ePASS Start / continue relaxed breathing 3 times daily Keep all upcoming appointment discussed today Continue with compliance of taking medication prescribed by Doctor Self Support options  (review material emailed to you for resources : [facility], housing, etc) Call 988 for 24/7 phone support from mental health specialist Call 911 for any emergent needs Expect a follow up call from our Managed Medicaid team for further support        If you are experiencing a Mental Health or Behavioral Health Crisis or need someone to talk to, please call the Suicide and Crisis Lifeline: 988 call 911   The patient verbalized understanding of instructions, educational materials, and care plan provided today and DECLINED offer to receive copy of patient instructions, educational materials, and care plan.   Our Managed Medicaid team will follow up with you.  Reece Levy,  MSW, LCSW Clinical Social Worker [phone]

## 2023-06-21 ENCOUNTER — Other Ambulatory Visit: Payer: Self-pay | Admitting: Obstetrics and Gynecology

## 2023-06-21 NOTE — Patient Outreach (Signed)
Medicaid Managed Care   Nurse Care Manager Note  06/21/2023 Name:  Matthew Lynch MRN:  [id number] DOB:  11-20-1970  Matthew Lynch is an 52 y.o. year old male who is a primary patient of Matthew Mola, MD.  [facility] Managed Care Coordination team was consulted for assistance with:    Chronic healthcare management needs,  DM, neuropathy, HLD, chronic LBP, tobacco use, anxiety/depression, poor appetite, abdominal pain, fecal incontinence  Matthew Lynch was given information about Medicaid Managed Care Coordination team services today. Matthew Lynch Patient agreed to services and verbal consent obtained.  Engaged with patient by telephone for initial visit in response to provider referral for case management and/or care coordination services.   Assessments/Interventions:  Review of past medical history, allergies, medications, health status, including review of consultants reports, laboratory and other test data, was performed as part of comprehensive evaluation and provision of chronic care management services.  SDOH (Social Determinants of Health) assessments and interventions performed: SDOH Interventions    Flowsheet Row Patient Outreach Telephone from 06/21/2023 in [place] POPULATION HEALTH DEPARTMENT Care Coordination from 06/09/2023 in [facility] Care Coordination Care Coordination from 03/17/2023 in [facility] Office Visit from 02/18/2023 in [facility] Office Visit from 09/07/2022 in [facility] Office Visit from 05/17/2022 in [facility]  SDOH Interventions        Food Insecurity Interventions -- Other (Comment)  [receives food stamps limited to cooking locations when staying in car] Other (Comment)  Matthew Lynch Foodstamps but has no other income] -- -- --  Housing Interventions -- Other (Comment)  [referral to communityresources] -- -- -- --   Transportation Interventions -- Intervention Not Indicated  [can't afford insurance, gas, etc] --  [Has access to Medicaid transportation] -- -- --  Utilities Interventions -- -- Intervention Not Indicated -- -- --  Alcohol Usage Interventions -- Intervention Not Indicated (Score <7) -- -- -- --  Depression Interventions/Treatment  -- Medication, Counseling -- Counseling, Medication Currently on Treatment Counseling  Financial Strain Interventions -- Other (Comment)  [link with resources] -- -- -- --  Stress Interventions -- [employer], Provide Counseling -- -- -- --  Health Literacy Interventions Intervention Not Indicated -- -- -- -- --     Care Plan No Known Allergies  Medications Reviewed Today     Reviewed by Danie Chandler, RN (Registered Nurse) on 06/21/23 at 1347  Med List Status: <None>   Medication Order Taking? Sig Documenting Provider Last Dose Status Informant  Accu-Chek Softclix Lancets lancets [id number] No Use three times daily to check blood sugars.  Patient not taking: Reported on 02/18/2023   Sabino Dick, DO Not Taking Active   Blood Glucose Monitoring Suppl (ACCU-CHEK GUIDE) w/Device KIT [id number] No Use as directed.  Patient not taking: Reported on 02/18/2023   Moses Manners, MD Not Taking Active   Blood Glucose Monitoring Suppl (TRUE METRIX METER) w/Device KIT [id number] No Use as directed  Patient not taking: Reported on 02/18/2023   Doreene Eland, MD Not Taking Active   Continuous Glucose Sensor (DEXCOM G7 SENSOR) MISC [id number] No 1 Device by Does not apply route as directed. Apply new sensor every 10 days. McDiarmid, Leighton Roach, MD Taking Active            Med Note Threasa Beards Mar 10, 2023  2:25 PM) STARTING G7  DULoxetine (CYMBALTA) 60 MG capsule [id number]  Take 2 capsules (120 mg total) by mouth daily. Tiffany Kocher, DO  Active   feeding supplement, GLUCERNA SHAKE, (GLUCERNA SHAKE) LIQD [id number] No Take 237 mLs  by mouth 3 (three) times daily between meals.  Patient not taking: Reported on 12/24/2022   Sabino Dick, DO Not Taking Active   glucose blood (ACCU-CHEK GUIDE) test strip [id number] No Patient taking multi-shot insulin. Dispense QS for BID testing.  Patient not taking: Reported on 04/12/2023   Moses Manners, MD Not Taking Active   glucose blood (TRUE METRIX BLOOD GLUCOSE TEST) test strip [id number] No Use as instructed  Patient not taking: Reported on 04/12/2023   Sabino Dick, DO Not Taking Active   insulin aspart (FIASP FLEXTOUCH) 100 UNIT/ML FlexTouch Pen [id number] No Inject 4-6 Units into the skin 3 (three) times daily before meals. Matthew Mola, MD Taking Active            Med Note Raymondo Band, Joaquim Lai Jun 07, 2023  3:37 PM) Takes 5-8 units with meals  Insulin Glargine (BASAGLAR KWIKPEN) 100 UNIT/ML [id number] No Inject 20 Units into the skin every morning. McDiarmid, Leighton Roach, MD Taking Active            Med Note Ronda Fairly Jun 07, 2023  3:36 PM) Takes 14 units  Insulin Pen Needle (BD PEN NEEDLE NANO U/F) 32G X 4 MM MISC [id number] No Use as directed once a day Hensel, Santiago Bumpers, MD Taking Active   loperamide (IMODIUM) 2 MG capsule [id number] No Take 2 mg by mouth daily as needed for diarrhea or loose stools. [provider] Taking Active            Med Note Raymondo Band, Joaquim Lai Jun 07, 2023  3:34 PM) Takes ~4 days weekly  pregabalin (LYRICA) 25 MG capsule [id number]  Take 1 capsule (25 mg total) by mouth 2 (two) times daily. Matthew Mola, MD  Active   rosuvastatin (CRESTOR) 10 MG tablet [id number] No Take 1 tablet (10 mg total) by mouth daily.  Patient not taking: Reported on 04/12/2023   McDiarmid, Leighton Roach, MD Not Taking Active   triamcinolone ointment (KENALOG) 0.5 % [id number] No Apply 1 Application topically 2 (two) times daily. McDiarmid, Leighton Roach, MD Taking Active            Patient Active Problem List   Diagnosis Date Noted   Neuropathy due to  type 2 diabetes mellitus (HCC) 05/17/2022   Underweight 05/17/2022   Bowel and bladder incontinence 04/26/2022   Abdominal pain 04/23/2022   Depression, recurrent (HCC) 01/29/2020   Hyperglycemia    Hyperlipidemia    Tobacco abuse    Anxiety and depression    Type 2 diabetes mellitus (HCC) 01/24/2020   Chronic bilateral low back pain without sciatica 08/24/2018   Conditions to be addressed/monitored per PCP order:  DM, neuropathy, HLD, chronic LBP, tobacco use, anxiety/depression, poor appetite, abdominal pain, fecal incontinence  Care Plan : RN Care Manager Plan of Care  Updates made by Danie Chandler, RN since 06/21/2023 12:00 AM     Problem: Health Promotion or Disease Self-Management (General Plan of Care)      Long-Range Goal: Chronic Disease Management and Care Coordination Needs   Start Date: 06/21/2023  Expected End Date: 09/21/2023  Priority: High  Note:   Current Barriers:  Knowledge Deficits related to plan of care for management of DM, neuropathy, HLD, chronic LBP, tobacco use, anxiety/depression, poor appetite, abdominal  pain, fecal incontinence Care Coordination needs related to DM, neuropathy, HLD, chronic LBP, tobacco use, anxiety/depression, poor appetite, abdominal pain, fecal incontinence Chronic Disease Management support and education needs related to DM, neuropathy, HLD, chronic LBP, tobacco use, anxiety/depression, poor appetite, abdominal pain, fecal incontinence Financial Constraints, SDOH-job, housing  RNCM Clinical Goal(s):  verbalize understanding of plan for management of  DM, neuropathy, HLD, chronic LBP, tobacco use, anxiety/depression, poor appetite, abdominal pain, fecal incontinence   as evidenced by patient report verbalize basic understanding of  DM, neuropathy, HLD, chronic LBP, tobacco use, anxiety/depression, poor appetite, abdominal pain, fecal incontinence disease process and self health management plan as evidenced by patient report take all  medications exactly as prescribed and will call provider for medication related questions as evidenced by patient report demonstrate understanding of rationale for each prescribed medication as evidenced by patient report attend all scheduled medical appointments as evidenced by patient report continue to work with RN Care Manager to address care management and care coordination needs related to DM, neuropathy, HLD, chronic LBP, tobacco use, anxiety/depression, poor appetite, abdominal pain, fecal incontinence as evidenced by adherence to CM Team Scheduled appointments work with Child psychotherapist to address  related to the management of resources, SDOH related to the management of DM, neuropathy, HLD, chronic LBP, tobacco use, anxiety/depression, poor appetite, abdominal pain, fecal incontinence   as evidenced by review of EMR and patient or social worker report demonstrate ongoing self health care management ability  as evidenced by patient report and EMR review through collaboration with Medical illustrator, provider, and care team.   Interventions: Evaluation of current treatment plan related to  self management and patient's adherence to plan as established by provider   Diabetes Interventions:  (Status:  New goal.) Long Term Goal Assessed patient's understanding of A1c goal: <7% Reviewed medications with patient and discussed importance of medication adherence Counseled on importance of regular laboratory monitoring as prescribed Discussed plans with patient for ongoing care management follow up and provided patient with direct contact information for care management team Reviewed scheduled/upcoming provider appointments Advised patient, providing education and rationale, to check cbg as directed  and record, calling provider  for findings outside established parameters Review of patient status, including review of consultants reports, relevant laboratory and other test results, and medications  completed Assessed social determinant of health barriers Lab Results  Component Value Date   HGBA1C 8.9 (A) 06/10/2023   Hyperlipidemia Interventions:  (Status:  New goal.) Long Term Goal Medication review performed; medication list updated in electronic medical record.  Counseled on importance of regular laboratory monitoring as prescribed Reviewed importance of limiting foods high in cholesterol Assessed social determinant of health barriers   Pain Interventions:  (Status:  New goal.) Long Term Goal Pain assessment performed Medications reviewed Discussed importance of adherence to all scheduled medical appointments Reviewed with patient prescribed pharmacological and nonpharmacological pain relief strategies Assessed social determinant of health barriers   Smoking Cessation Interventions:  (Status:  New goal.) Long Term Goal Reviewed smoking history:   currently smoking 4-5 cigarettes a day  Evaluation of current treatment plan reviewed Reviewed scheduled/upcoming provider appointments  Provided contact information for [place] Quit Line (1-800-QUIT-NOW) Discussed plans with patient for ongoing care management follow up and provided patient with direct contact information for care management team Assessed social determinant of health barriers  SDOH Barriers (Status:  ) Long Term Goal Patient interviewed and SDOH assessment performed        SDOH Interventions  Flowsheet Row Patient Outreach Telephone from 06/21/2023 in [place] POPULATION HEALTH DEPARTMENT Care Coordination from 06/09/2023 in [facility] Care Coordination Care Coordination from 03/17/2023 in [facility] Care Coordination Office Visit from 02/18/2023 in [facility] Office Visit from 09/07/2022 in [facility] Office Visit from 05/17/2022 in [facility]  SDOH Interventions        Food Insecurity  Interventions -- Other (Comment)  [receives food stamps limited to cooking locations when staying in car] Other (Comment)  Matthew Lynch Foodstamps but has no other income] -- -- --  Housing Interventions -- Other (Comment)  [referral to communityresources] -- -- -- --  Transportation Interventions -- Intervention Not Indicated  [can't afford insurance, gas, etc] --  [Has access to Medicaid transportation] -- -- --  Utilities Interventions -- -- Intervention Not Indicated -- -- --  Alcohol Usage Interventions -- Intervention Not Indicated (Score <7) -- -- -- --  Depression Interventions/Treatment  -- Medication, Counseling -- Counseling, Medication Currently on Treatment Counseling  Financial Strain Interventions -- Other (Comment)  [link with resources] -- -- -- --  Stress Interventions -- [employer], Provide Counseling -- -- -- --  Health Literacy Interventions Intervention Not Indicated -- -- -- -- --      Patient interviewed and appropriate assessments performed Referred patient to SW for assistance wit resources, SDOH Discussed plans with patient for ongoing care management follow up and provided patient with direct contact information for care management team  Patient Goals/Self-Care Activities: Take all medications as prescribed Attend all scheduled provider appointments Call pharmacy for medication refills 3-7 days in advance of running out of medications Perform all self care activities independently  Perform IADL's (shopping, preparing meals, housekeeping, managing finances) independently Call provider office for new concerns or questions  Work with the social worker to address care coordination needs and will continue to work with the clinical team to address health care and disease management related needs  Follow Up Plan:  The patient has been provided with contact information for the care management team and has been advised to call with any health related  questions or concerns.  The care management team will reach out to the patient again over the next 30 business  days.   Long-Range Goal: Establish Plan of care for Chronic Disease Management Needs and SDOH Barriers   Priority: High  Note:   Timeframe:  Long-Range Goal Priority:  High Start Date:      06/21/23                       Expected End Date:    ongoing                   Follow Up Date 07/22/23   - practice safe sex - schedule appointment for flu shot - schedule appointment for vaccines needed due to my age or health - schedule recommended health tests - schedule and keep appointment for annual check-up    Why is this important?   Screening tests can find diseases early when they are easier to treat.  Your doctor or nurse will talk with you about which tests are important for you.  Getting shots for common diseases like the flu and shingles will help prevent them.     Follow Up:  Patient agrees to Care Plan and Follow-up.  Plan: The Managed Medicaid care management team will reach  out to the patient again over the next 30 business  days. and The  Patient has been provided with contact information for the Managed Medicaid care management team and has been advised to call with any health related questions or concerns.  Date/time of next scheduled RN care management/care coordination outreach:  07/22/23 at 230.

## 2023-06-21 NOTE — Patient Instructions (Signed)
Visit Information  Matthew Lynch was given information about Medicaid Managed Care team care coordination services as a part of their Healthy [facility] Medicaid benefit. Matthew Lynch verbally consented to engagement with the [facility] Managed Care team.   If you are experiencing a medical emergency, please call 911 or report to your local emergency department or urgent care.   If you have a non-emergency medical problem during routine business hours, please contact your provider's office and ask to speak with a nurse.   For questions related to your Healthy [facility] health plan, please call: [phone] or visit the homepage here: [email or web address]  If you would like to schedule transportation through your Healthy [facility] plan, please call the following number at least 2 days in advance of your appointment: [phone]  For information about your ride after you set it up, call Ride Assist at [phone]. Use this number to activate a Will Call pickup, or if your transportation is late for a scheduled pickup. Use this number, too, if you need to make a change or cancel a previously scheduled reservation.  If you need transportation services right away, call [phone]. The after-hours call center is staffed 24 hours to handle ride assistance and urgent reservation requests (including discharges) 365 days a year. Urgent trips include sick visits, hospital discharge requests and life-sustaining treatment.  Call the [facility] Line at [phone], at any time, 24 hours a day, 7 days a week. If you are in danger or need immediate medical attention call 911.  If you would like help to quit smoking, call 1-800-QUIT-NOW (310-090-6997) OR Espaol: 1-855-Djelo-Ya (1-884-166-0630) o para ms informacin haga clic aqu or Text READY to 160-109 to register via text  [name] - following are the goals we discussed in your visit today:    Goals Addressed    Timeframe:  Long-Range Goal Priority:  High Start Date:      06/21/23                       Expected End Date:    ongoing                   Follow Up Date 07/22/23   - practice safe sex - schedule appointment for flu shot - schedule appointment for vaccines needed due to my age or health - schedule recommended health tests - schedule and keep appointment for annual check-up    Why is this important?   Screening tests can find diseases early when they are easier to treat.  Your doctor or nurse will talk with you about which tests are important for you.  Getting shots for common diseases like the flu and shingles will help prevent them.    Patient verbalizes understanding of instructions and care plan provided today and agrees to view in MyChart. Active MyChart status and patient understanding of how to access instructions and care plan via MyChart confirmed with patient.     The Managed Medicaid care management team will reach out to the patient again over the next 30 business  days.  The  Patient  has been provided with contact information for the Managed Medicaid care management team and has been advised to call with any health related questions or concerns.   Kathi Der RN, BSN [place]  [facility] Care Management Coordinator - Managed Medicaid High Risk [phone]   Following is a copy of your plan of care:  Care Plan : RN  Care Manager Plan of Care  Updates made by Danie Chandler, RN since 06/21/2023 12:00 AM     Problem: Health Promotion or Disease Self-Management (General Plan of Care)      Long-Range Goal: Chronic Disease Management and Care Coordination Needs   Start Date: 06/21/2023  Expected End Date: 09/21/2023  Priority: High  Note:   Current Barriers:  Knowledge Deficits related to plan of care for management of DM, neuropathy, HLD, chronic LBP, tobacco use, anxiety/depression, poor appetite, abdominal pain, fecal  incontinence Care Coordination needs related to DM, neuropathy, HLD, chronic LBP, tobacco use, anxiety/depression, poor appetite, abdominal pain, fecal incontinence Chronic Disease Management support and education needs related to DM, neuropathy, HLD, chronic LBP, tobacco use, anxiety/depression, poor appetite, abdominal pain, fecal incontinence Financial Constraints, SDOH-job, housing  RNCM Clinical Goal(s):  verbalize understanding of plan for management of  DM, neuropathy, HLD, chronic LBP, tobacco use, anxiety/depression, poor appetite, abdominal pain, fecal incontinence   as evidenced by patient report verbalize basic understanding of  DM, neuropathy, HLD, chronic LBP, tobacco use, anxiety/depression, poor appetite, abdominal pain, fecal incontinence disease process and self health management plan as evidenced by patient report take all medications exactly as prescribed and will call provider for medication related questions as evidenced by patient report demonstrate understanding of rationale for each prescribed medication as evidenced by patient report attend all scheduled medical appointments as evidenced by patient report continue to work with RN Care Manager to address care management and care coordination needs related to DM, neuropathy, HLD, chronic LBP, tobacco use, anxiety/depression, poor appetite, abdominal pain, fecal incontinence as evidenced by adherence to CM Team Scheduled appointments work with Child psychotherapist to address  related to the management of resources, SDOH related to the management of DM, neuropathy, HLD, chronic LBP, tobacco use, anxiety/depression, poor appetite, abdominal pain, fecal incontinence   as evidenced by review of EMR and patient or social worker report demonstrate ongoing self health care management ability  as evidenced by patient report and EMR review through collaboration with Medical illustrator, provider, and care team.   Interventions: Evaluation of  current treatment plan related to  self management and patient's adherence to plan as established by provider   Diabetes Interventions:  (Status:  New goal.) Long Term Goal Assessed patient's understanding of A1c goal: <7% Reviewed medications with patient and discussed importance of medication adherence Counseled on importance of regular laboratory monitoring as prescribed Discussed plans with patient for ongoing care management follow up and provided patient with direct contact information for care management team Reviewed scheduled/upcoming provider appointments Advised patient, providing education and rationale, to check cbg as directed  and record, calling provider  for findings outside established parameters Review of patient status, including review of consultants reports, relevant laboratory and other test results, and medications completed Assessed social determinant of health barriers Lab Results  Component Value Date   HGBA1C 8.9 (A) 06/10/2023   Hyperlipidemia Interventions:  (Status:  New goal.) Long Term Goal Medication review performed; medication list updated in electronic medical record.  Counseled on importance of regular laboratory monitoring as prescribed Reviewed importance of limiting foods high in cholesterol Assessed social determinant of health barriers   Pain Interventions:  (Status:  New goal.) Long Term Goal Pain assessment performed Medications reviewed Discussed importance of adherence to all scheduled medical appointments Reviewed with patient prescribed pharmacological and nonpharmacological pain relief strategies Assessed social determinant of health barriers   Smoking Cessation Interventions:  (Status:  New goal.) Long Term  Goal Reviewed smoking history:   currently smoking 4-5 cigarettes a day  Evaluation of current treatment plan reviewed Reviewed scheduled/upcoming provider appointments  Provided contact information for [place] Quit Line  (1-800-QUIT-NOW) Discussed plans with patient for ongoing care management follow up and provided patient with direct contact information for care management team Assessed social determinant of health barriers  SDOH Barriers (Status:  ) Long Term Goal Patient interviewed and SDOH assessment performed        SDOH Interventions    Flowsheet Row Patient Outreach Telephone from 06/21/2023 in [place] [facility] Care Coordination from 06/09/2023 in [facility] Care Coordination from 03/17/2023 in [facility] Office Visit from 02/18/2023 in [facility] Office Visit from 09/07/2022 in [facility] Office Visit from 05/17/2022 in [facility]  SDOH Interventions        Food Insecurity Interventions -- Other (Comment)  [receives food stamps limited to cooking locations when staying in car] Other (Comment)  Dolores Lory Foodstamps but has no other income] -- -- --  Housing Interventions -- Other (Comment)  [referral to communityresources] -- -- -- --  Transportation Interventions -- Intervention Not Indicated  [can't afford insurance, gas, etc] --  [Has access to Medicaid transportation] -- -- --  Utilities Interventions -- -- Intervention Not Indicated -- -- --  Alcohol Usage Interventions -- Intervention Not Indicated (Score <7) -- -- -- --  Depression Interventions/Treatment  -- Medication, Counseling -- Counseling, Medication Currently on Treatment Counseling  Financial Strain Interventions -- Other (Comment)  [link with resources] -- -- -- --  Stress Interventions -- [employer], Provide Counseling -- -- -- --  Health Literacy Interventions Intervention Not Indicated -- -- -- -- --      Patient interviewed and appropriate assessments performed Referred patient to SW for assistance wit resources, SDOH Discussed  plans with patient for ongoing care management follow up and provided patient with direct contact information for care management team  Patient Goals/Self-Care Activities: Take all medications as prescribed Attend all scheduled provider appointments Call pharmacy for medication refills 3-7 days in advance of running out of medications Perform all self care activities independently  Perform IADL's (shopping, preparing meals, housekeeping, managing finances) independently Call provider office for new concerns or questions  Work with the social worker to address care coordination needs and will continue to work with the clinical team to address health care and disease management related needs  Follow Up Plan:  The patient has been provided with contact information for the care management team and has been advised to call with any health related questions or concerns.  The care management team will reach out to the patient again over the next 30 business  days.

## 2023-06-23 ENCOUNTER — Other Ambulatory Visit: Payer: Self-pay

## 2023-06-23 NOTE — Patient Instructions (Signed)
Visit Information  [name] was given information about Medicaid Managed Care team care coordination services as a part of their Healthy [facility] Medicaid benefit. Matthew Lynch verbally consented to engagement with the [facility] Managed Care team.   If you are experiencing a medical emergency, please call 911 or report to your local emergency department or urgent care.   If you have a non-emergency medical problem during routine business hours, please contact your provider's office and ask to speak with a nurse.   For questions related to your Healthy [facility] health plan, please call: [phone] or visit the homepage here: [email or web address]  If you would like to schedule transportation through your Healthy [facility] plan, please call the following number at least 2 days in advance of your appointment: [phone]  For information about your ride after you set it up, call Ride Assist at [phone]. Use this number to activate a Will Call pickup, or if your transportation is late for a scheduled pickup. Use this number, too, if you need to make a change or cancel a previously scheduled reservation.  If you need transportation services right away, call [phone]. The after-hours call center is staffed 24 hours to handle ride assistance and urgent reservation requests (including discharges) 365 days a year. Urgent trips include sick visits, hospital discharge requests and life-sustaining treatment.  Call the [facility] Line at [phone], at any time, 24 hours a day, 7 days a week. If you are in danger or need immediate medical attention call 911.  If you would like help to quit smoking, call 1-800-QUIT-NOW (580-108-4428) OR Espaol: 1-855-Djelo-Ya (5-427-062-3762) o para ms informacin haga clic aqu or Text READY to 831-517 to register via text  Matthew Lynch - following are the goals we discussed in your visit today:    Goals Addressed   None      Social Worker will follow up in 30 days.   Matthew Lynch, Matthew Lynch, MHA [facility]  Managed Medicaid Social Worker [phone]   Following is a copy of your plan of care:  There are no care plans that you recently modified to display for this patient.

## 2023-06-23 NOTE — Patient Outreach (Signed)
Medicaid Managed Care Social Work Note  06/23/2023 Name:  Matthew Lynch MRN:  [id number] DOB:  05-02-1971  Matthew Lynch is an 52 y.o. year old male who is a primary patient of Matthew Mola, MD.  The Medicaid Managed Care Coordination team was consulted for assistance with:  Community Resources   Matthew Lynch was given information about Medicaid Managed Care Coordination team services today. Matthew Lynch Patient agreed to services and verbal consent obtained.  Engaged with patient  for by telephone forinitial visit in response to referral for case management and/or care coordination services.   Assessments/Interventions:  Review of past medical history, allergies, medications, health status, including review of consultants reports, laboratory and other test data, was performed as part of comprehensive evaluation and provision of chronic care management services.  SDOH: (Social Determinant of Health) assessments and interventions performed: SDOH Interventions    Flowsheet Row Patient Outreach Telephone from 06/21/2023 in [facility] Care Coordination from 06/09/2023 in [facility] Care Coordination Care Coordination from 03/17/2023 in [facility] Office Visit from 02/18/2023 in Matthew Lynch Office Visit from 09/07/2022 in Matthew Lynch Office Visit from 05/17/2022 in Matthew Lynch  SDOH Interventions        Food Insecurity Interventions -- Other (Comment)  [receives food stamps limited to cooking locations when staying in car] Other (Comment)  Matthew Lynch Foodstamps but has no other income] -- -- --  Housing Interventions -- Other (Comment)  [referral to communityresources] -- -- -- --  Transportation Interventions -- Intervention Not Indicated  [can't afford insurance, gas, etc] --  [Has access to Medicaid transportation] -- -- --  Utilities  Interventions -- -- Intervention Not Indicated -- -- --  Alcohol Usage Interventions -- Intervention Not Indicated (Score <7) -- -- -- --  Depression Interventions/Treatment  -- Medication, Counseling -- Counseling, Medication Currently on Treatment Counseling  Financial Strain Interventions -- Other (Comment)  [link with resources] -- -- -- --  Stress Interventions -- [employer], Provide Counseling -- -- -- --  Health Literacy Interventions Intervention Not Indicated -- -- -- -- --     BSW completed a telephone outreach with patient, he states he is currently living with his mother, he has no income and has not worked since 2021. Patient dd apply for disability but was denied. Patient states his mom wants him out of the home. Patient reports he has issues with fatigue and vision issues. He does not have an appointment to the eye doctor until June. Patient does receive foodstamps. BSW informed patient it is hard to find housing right now. BSW will email patient resources for housing and assistance with disability appeal to [email or web address]  Advanced Directives Status:  Not addressed in this encounter.  Care Plan                 No Known Allergies  Medications Reviewed Today   Medications were not reviewed in this encounter     Patient Active Problem List   Diagnosis Date Noted   Neuropathy due to type 2 diabetes mellitus (HCC) 05/17/2022   Underweight 05/17/2022   Bowel and bladder incontinence 04/26/2022   Abdominal pain 04/23/2022   Depression, recurrent (HCC) 01/29/2020   Hyperglycemia    Hyperlipidemia    Tobacco abuse    Anxiety and depression    Type 2 diabetes mellitus (HCC) 01/24/2020   Chronic bilateral low back pain without sciatica  08/24/2018    Conditions to be addressed/monitored per PCP order:   housing and disability resources  There are no care plans that you recently modified to display for this patient.   Follow up:   Patient agrees to Care Plan and Follow-up.  Plan: The Managed Medicaid care management team will reach out to the patient again over the next 30 days.  Date/time of next scheduled Social Work care management/care coordination outreach:  07/26/23  Matthew Lynch, Matthew Lynch, [facility]  [facility]  Managed Matthew Lynch Social Worker [phone]

## 2023-07-02 DIAGNOSIS — R32 Unspecified urinary incontinence: Secondary | ICD-10-CM | POA: Diagnosis not present

## 2023-07-02 DIAGNOSIS — Z833 Family history of diabetes mellitus: Secondary | ICD-10-CM | POA: Diagnosis not present

## 2023-07-02 DIAGNOSIS — E1165 Type 2 diabetes mellitus with hyperglycemia: Secondary | ICD-10-CM | POA: Diagnosis not present

## 2023-07-02 DIAGNOSIS — Z794 Long term (current) use of insulin: Secondary | ICD-10-CM | POA: Diagnosis not present

## 2023-07-02 DIAGNOSIS — Z87891 Personal history of nicotine dependence: Secondary | ICD-10-CM | POA: Diagnosis not present

## 2023-07-02 DIAGNOSIS — Z7984 Long term (current) use of oral hypoglycemic drugs: Secondary | ICD-10-CM | POA: Diagnosis not present

## 2023-07-02 DIAGNOSIS — E1159 Type 2 diabetes mellitus with other circulatory complications: Secondary | ICD-10-CM | POA: Diagnosis not present

## 2023-07-02 DIAGNOSIS — G40909 Epilepsy, unspecified, not intractable, without status epilepticus: Secondary | ICD-10-CM | POA: Diagnosis not present

## 2023-07-02 DIAGNOSIS — F322 Major depressive disorder, single episode, severe without psychotic features: Secondary | ICD-10-CM | POA: Diagnosis not present

## 2023-07-02 DIAGNOSIS — Z008 Encounter for other general examination: Secondary | ICD-10-CM | POA: Diagnosis not present

## 2023-07-02 DIAGNOSIS — E1140 Type 2 diabetes mellitus with diabetic neuropathy, unspecified: Secondary | ICD-10-CM | POA: Diagnosis not present

## 2023-07-02 DIAGNOSIS — E785 Hyperlipidemia, unspecified: Secondary | ICD-10-CM | POA: Diagnosis not present

## 2023-07-02 DIAGNOSIS — F419 Anxiety disorder, unspecified: Secondary | ICD-10-CM | POA: Diagnosis not present

## 2023-07-04 ENCOUNTER — Ambulatory Visit: Payer: 59 | Admitting: Family Medicine

## 2023-07-04 ENCOUNTER — Telehealth: Payer: Self-pay | Admitting: Family Medicine

## 2023-07-04 ENCOUNTER — Telehealth: Payer: Self-pay

## 2023-07-04 NOTE — Telephone Encounter (Signed)
Called Home number  Left VM - to call office or my cell when he gets the message  Called cell number got VM for Consuello Closs listed as his mother Left same VM as above

## 2023-07-04 NOTE — Telephone Encounter (Signed)
Received call from Amil Amen, NP with [facility] regarding patient having elevated PHQ-9.   Reports that per screening, patient is severely depressed. He is currently on duloxetine, however, would be open to psychiatric services.   Denies SI/HI.   Advised that patient has follow up appointment today at 1450.  Veronda Prude, RN

## 2023-07-05 ENCOUNTER — Telehealth: Payer: Self-pay | Admitting: Family Medicine

## 2023-07-05 NOTE — Telephone Encounter (Signed)
Called patient yesterday, 11/4, as patient left clinic from the waiting room after filling out PHQ-9.  PHQ-9 was concerning with all questions marked with 3 points and question 9 marked positive with a 1.  Called patient around 5 PM.  Patient answered the phone and responded that he was doing "okay" I told patient that I was worried about him given his PHQ-9 scores and apologized for not being able to see them in clinic.  I told patient that I was there for him and if he would like to do a virtual visit I could do that as well or talk to him regarding his situation over the phone.  Patient said that he was able to make an appointment on Wednesday.  I asked patient if he felt that he would be safe at home until Wednesday.  He said that he was definitely not going to end his life.  I asked if his 2 children were his protective factors and he agreed but also said that his faith is a big protective factor in his life.  Offered to call patient today to check on him before his appointment on Wednesday, but patient declined saying that he felt like he could wait until Wednesday and felt okay.

## 2023-07-06 ENCOUNTER — Encounter: Payer: Self-pay | Admitting: Family Medicine

## 2023-07-06 ENCOUNTER — Ambulatory Visit (INDEPENDENT_AMBULATORY_CARE_PROVIDER_SITE_OTHER): Payer: 59 | Admitting: Family Medicine

## 2023-07-06 VITALS — BP 100/70 | HR 100 | Ht 65.0 in | Wt 106.6 lb

## 2023-07-06 DIAGNOSIS — E1100 Type 2 diabetes mellitus with hyperosmolarity without nonketotic hyperglycemic-hyperosmolar coma (NKHHC): Secondary | ICD-10-CM | POA: Diagnosis not present

## 2023-07-06 DIAGNOSIS — F341 Dysthymic disorder: Secondary | ICD-10-CM

## 2023-07-06 DIAGNOSIS — R32 Unspecified urinary incontinence: Secondary | ICD-10-CM

## 2023-07-06 DIAGNOSIS — E1140 Type 2 diabetes mellitus with diabetic neuropathy, unspecified: Secondary | ICD-10-CM | POA: Diagnosis not present

## 2023-07-06 DIAGNOSIS — E440 Moderate protein-calorie malnutrition: Secondary | ICD-10-CM

## 2023-07-06 DIAGNOSIS — E1165 Type 2 diabetes mellitus with hyperglycemia: Secondary | ICD-10-CM

## 2023-07-06 DIAGNOSIS — R159 Full incontinence of feces: Secondary | ICD-10-CM

## 2023-07-06 DIAGNOSIS — N4889 Other specified disorders of penis: Secondary | ICD-10-CM

## 2023-07-06 DIAGNOSIS — Z794 Long term (current) use of insulin: Secondary | ICD-10-CM | POA: Diagnosis not present

## 2023-07-06 MED ORDER — METRONIDAZOLE 500 MG PO TABS
250.0000 mg | ORAL_TABLET | Freq: Four times a day (QID) | ORAL | 0 refills | Status: AC
Start: 2023-07-06 — End: 2023-07-16

## 2023-07-06 MED ORDER — GLUCERNA SHAKE PO LIQD
237.0000 mL | Freq: Three times a day (TID) | ORAL | 0 refills | Status: AC
Start: 2023-07-06 — End: ?

## 2023-07-06 MED ORDER — PHENAZOPYRIDINE HCL 100 MG PO TABS
100.0000 mg | ORAL_TABLET | Freq: Three times a day (TID) | ORAL | 0 refills | Status: DC | PRN
Start: 2023-07-06 — End: 2023-08-12

## 2023-07-06 MED ORDER — BASAGLAR KWIKPEN 100 UNIT/ML ~~LOC~~ SOPN: 20.0000 [IU] | PEN_INJECTOR | SUBCUTANEOUS | 11 refills | Status: DC

## 2023-07-06 MED ORDER — DEXCOM G7 SENSOR MISC: 1.0000 | 11 refills | Status: DC

## 2023-07-06 MED ORDER — FIASP FLEXTOUCH 100 UNIT/ML ~~LOC~~ SOPN: 4.0000 [IU] | PEN_INJECTOR | Freq: Three times a day (TID) | SUBCUTANEOUS | 11 refills | Status: DC

## 2023-07-06 MED ORDER — BD PEN NEEDLE NANO U/F 32G X 4 MM MISC: 1 refills | Status: DC

## 2023-07-06 NOTE — Patient Instructions (Addendum)
It was wonderful to see you today.  Please bring ALL of your medications with you to every visit.   Today we talked about:  Penile pain - I am prescribing a medicine called pyridium to see if this will help with your discomfort. I have also put in a referral to urology to see if they have any other therapies that could help.   Pain in general - With your need for spine surgery we will be more aggressive about getting your A1c down. I have refilled your monitor. Please pick it up and start trending your sugars. That way you can see what changes we can make by your appointment with Dr. Raymondo Band.   Mood: I have attached a list of therapists below. Please please please let me know if you feel you are in an emergency or feel like you have no where to turn to.   Diarrhea - I have prescribed the antibiotic that was prescribed by the gastroenterologist. Please call this number to make a follow up appointment with them.   [facility] [address] 3rd Floor [place], [place] [address] [phone]  You saw a doctor named Dr. Yancey Flemings   Thank you for choosing [facility].   Please call [phone] with any questions about today's appointment.   [name], MD  Family Medicine     Therapy and Counseling Resources Most providers on this list will take Medicaid. Patients with commercial insurance or Medicare should contact their insurance company to get a list of in network providers.  [facility] (takes children) Location 1: [address] [place], [place] [address] Location 2: [address] [place], [place] [address] [phone]   [facility] (spanish speaking therapist available)(habla espanol)(take medicare and medicaid)  [address], [place], [place] [address], [email or web address] [phone]  [facility]Psychiatry and Counseling [address] [place], [place] [address] [phone]  [facility]   [address] [place], [place] [address]      [phone]  [facility] (spanish available) [address]  [place], [place] [address] [phone]  [facility] (take [facility] and medicare) [address], [address]  [place], [place] [address]       [phone]     [facility] (virtual only) [phone]  [facility] [address] [place], [place] [phone]  [facility]:  [address] [place] [phone]  [facility]:  [address] [place] [phone]  [facility] (under & uninsured) [address]   [place] [phone]    [email or web address]    [facility] [address]  [place]    [phone]  [facility] -[address]     Phone:  [phone]     [facility]-  [address]  [phone]   [facility] [address]      [place], [place] [phone]  [facility]: [address] [place], [place], [place]  [phone]   [facility] (Spanish therapist) [email or web address]  [address] [place]  [address]   [place] [address]    [phone]    [facility]   [address],  [place], [place]  [phone]   [facility]  [address] [place]  [phone]  [facility]  [address] [place], [place]        [phone]  Open Access/Walk In Clinic under & uninsured  [facility]  [address] [place], [place] [phone] Crisis [phone]  [facility] [place],  (Spanish)   [address], [place]: [phone]) 8:30 - 12; 1 - 2:30  [facility] [employer],  [address] [place], [place]    [phone]):8:30 - 12; 2 - 3PM  [facility],   [address]  [place]; [phone]):   Mon - Fri 8 AM - 5 PM  [facility] [address] [place]  MWF 12:30 to 3:00 or call to schedule an appointment  [phone]  Specific Provider options Psychology Today  [email or web address] click on find a therapist  enter your zip code left side and select or tailor a therapist for your specific need.   [facility] Provider Directory [email or web address]  (Medicaid)   Follow all drop down to find a provider  Social Support program [facility] [phone] or PhotoSolver.pl [address] [place], [place] Recovery support and educational   24- Hour Availability:   [facility]  [address] [place], [place] [phone] Crisis [phone]  [facility] [phone]  [facility]  [phone]   [facility]  [phone] (after hours)  Therapeutic Alternative/Mobile Crisis   [phone]  [place] National Suicide Hotline  [phone] Len Childs)  Call 911 or go to emergency room  [facility]  [phone]);  [place] and [employer]  [phone]); [place], [place], [place], [place], [place], [place], [place]

## 2023-07-07 NOTE — Progress Notes (Signed)
SUBJECTIVE:   CHIEF COMPLAINT / HPI:   Depression  Patient has significant depressive symptoms.  He left a PHQ-9 survey at the front desk 2 days ago with a positive question 9.  He often has very severe depressive symptoms.  2 days ago patient left from the waiting room as I was running behind with clinic visits.  I had called patient that to follow-up and patient said that did not have SI or plan.  Today patient says he would never end his life.  He does have thoughts that sometimes he would be better off dead and this is mainly due to his chronic pain.  This causes a significant strain on his life.  He wants to be able to work and to have a normal life.  It is difficult for him to go to doctors appointments pay his child support as he is not able to work earned enough money.  He also has significant medical bills.  Patient declines medication for depression at this time.  He had side effects from Cymbalta and has heavy pill burden.  He has been feeling a little bit better after receiving some help from social work.  So he is slightly optimistic.  He is open to therapy.  Denies any SI, SH, HI at this time.  Says his faith and his children are protective factors for him.  Penile Pain  Patient reports significant penile pain that goes to the base of his penis.  He says that it is a sharp pain.  Has worse pain when he is trying to urinate.  Additionally he says that it is difficult for patient to have an erection.  And he has pain when an erection.  Says that he does have pain whenever he moves in any direction pain will to his penis.  Endorses dysuria, frequency, urgency with some urinary incontinence.  Is unable to tell if this has relation with his diabetes and sugar levels or not.  Has not had a history of STDs.  Symptoms were not necessarily worse while he was on Jardiance previously. This is the main source of pain that is causing significant distress to patient's life.  Fecal Incontinence and  abdominal pain  Patient has diarrhea throughout the day and nightly incontinence every single night.  This makes it very difficult for patient to eat and sustain nutrition.  This is why patient has malnutrition.  He has seen GI previously and was prescribed metronidazole however patient reports that he had just become that prescription when he unfortunately was taken to jail for being unable to pay child support.  He was not able to get his medications back at that time.  He never took the full course of antibiotics.  He is very interested in following up with GI again.  His colonoscopy and endoscopy were normal.  Patient has not been out of the country.  No fevers.  No blood in his stool.  He does like the protein shakes however says that insurance is not covering for them despite his protein calorie malnutrition.  Diabetes  Patient is interested in CGM and having better control of his diabetes.  He is proud of the fact that he is able to get his A1c from around 13-8.  However he is unsure his blood sugars are still high.  Says that he is not given insulin when he was in jail multiple times.  When asking for his insulin he would be given 1 tablet of metformin  if even that.  So occasionally patient would not have his insulin medications.  He has tried to show the court is MyChart records stating that he does not have capability to pay is on disability however has not had much luck. He is interested with meeting with Dr. Hildred Laser to optimize his diabetes. 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  They said that he could not have surgery until his A1c was 7 or below.  Patient is interested in optimizing his diabetes so that he could have the surgery and hopefully be in less pain.  PERTINENT  PMH / PSH: Diabetes, protein calorie malnutrition, significant social determinants of health, severe MDD  OBJECTIVE:    BP 100/70   Pulse 100   Ht 5\' 5"  (1.651 m)   Wt 106 lb 9.6 oz (48.4 kg)   SpO2 100%   BMI 17.74 kg/m   General: Very thin chronically ill appearing, in no acute distress CV: RRR, radial pulses equal and palpable, no BLE edema  Resp: Normal work of breathing on room air, CTAB Abd: Soft, mildly tender throughout, non distended  Neuro: Alert & Oriented x 4, PERRLA, EOMI, CN II through XII intact, upper extremities strength 5 out of 5, lower extremity strength 4 out of 5 bilaterally with hyperalgesia. Psych: Flat affect, though occasionally optimistic when discussing plan  ASSESSMENT/PLAN:   Assessment & Plan Bowel and bladder incontinence Concern for possible neurologic cause.  Patient has been evaluated by neurosurgery as well as GI.  However patient was not able to fulfill all of the recommendations by GI.  Incontinence is not necessarily worse from prior so unlikely that neurologic causes progressing and does not emergent at this time. - Refilled patient's prescription for Flagyl as recommended by GI - Offered patient phone number for [facility] that he had gone to previously to follow-up as they had additional recommendations after patient had completed the previous ones. - Will optimize patient's A1c and have him follow-up with neurosurgery. Penile irritation Penile irritation could be multifactorial due to neuropathy from diabetes, chronic prostatitis. - Previous urinalysis was negative for bacteria leukocytes or blood.  Will trial Pyridium for pain caused by chronic prostatitis. - Will optimize A1c for if pain is due to diabetes.  Lyrica did not help with his pain.  Thus have discontinued this at this time. - Referral to urology Type 2 diabetes mellitus with diabetic neuropathy, with long-term current use of insulin (HCC) A1c is 8.9 after last visit.  Refilled the patient's Dexcom G7 so that he can adjust his insulin. - I will write the patient a letter so that if he has any  issues and is in jail again hopefully we will be able to have access to his insulin.  If he did not have access to his insulin this could be life-threatening. - Scheduled appointment with Dr. Hildred Laser to optimize patient's insulin regimen. Persistent depressive disorder Patient does not have SI today.  No plan.  He is very high risk given his chronic pain, social isolation, comorbidities, access to medications. - Will hold off on medication at this time however have told patient that if his depression continues to be this severe would recommend medications in the future. - Offered patient a list of therapists covered by his insurance.  Patient agrees that he will call and schedule a therapy appointment.      [name], MD  [facility]

## 2023-07-07 NOTE — Assessment & Plan Note (Signed)
A1c is 8.9 after last visit.  Refilled the patient's Dexcom G7 so that he can adjust his insulin. - I will write the patient a letter so that if he has any issues and is in jail again hopefully we will be able to have access to his insulin.  If he did not have access to his insulin this could be life-threatening. - Scheduled appointment with Dr. Hildred Laser to optimize patient's insulin regimen.

## 2023-07-07 NOTE — Assessment & Plan Note (Signed)
Concern for possible neurologic cause.  Patient has been evaluated by neurosurgery as well as GI.  However patient was not able to fulfill all of the recommendations by GI.  Incontinence is not necessarily worse from prior so unlikely that neurologic causes progressing and does not emergent at this time. - Refilled patient's prescription for Flagyl as recommended by GI - Offered patient phone number for [facility] that he had gone to previously to follow-up as they had additional recommendations after patient had completed the previous ones. - Will optimize patient's A1c and have him follow-up with neurosurgery.

## 2023-07-12 ENCOUNTER — Ambulatory Visit (INDEPENDENT_AMBULATORY_CARE_PROVIDER_SITE_OTHER): Payer: 59 | Admitting: Pharmacist

## 2023-07-12 ENCOUNTER — Encounter: Payer: Self-pay | Admitting: Pharmacist

## 2023-07-12 VITALS — BP 99/74 | HR 108 | Wt 109.8 lb

## 2023-07-12 DIAGNOSIS — E1165 Type 2 diabetes mellitus with hyperglycemia: Secondary | ICD-10-CM

## 2023-07-12 DIAGNOSIS — E1140 Type 2 diabetes mellitus with diabetic neuropathy, unspecified: Secondary | ICD-10-CM

## 2023-07-12 DIAGNOSIS — Z794 Long term (current) use of insulin: Secondary | ICD-10-CM | POA: Diagnosis not present

## 2023-07-12 DIAGNOSIS — E785 Hyperlipidemia, unspecified: Secondary | ICD-10-CM | POA: Diagnosis not present

## 2023-07-12 MED ORDER — ROSUVASTATIN CALCIUM 10 MG PO TABS: 10.0000 mg | ORAL_TABLET | Freq: Every day | ORAL | 11 refills | Status: DC

## 2023-07-12 MED ORDER — FIASP FLEXTOUCH 100 UNIT/ML ~~LOC~~ SOPN: 4.0000 [IU] | PEN_INJECTOR | Freq: Three times a day (TID) | SUBCUTANEOUS | Status: DC

## 2023-07-12 NOTE — Progress Notes (Signed)
S:     Chief Complaint  Patient presents with   Medication Management    T2DM - CGM on insulin   52 y.o. male who presents for diabetes evaluation, education, and management. Patient arrives in okay spirits and presents without any assistance. Reports that he is "smoking like a freight train", he is very stressed out.   Patient was last seen by Primary Care Provider, Dr. Marsh Dolly, on 06/10/2023.   PMH is significant for T2DM, Diabetic neuropathy.  At last visit, discussed intense pelvic/leg pain. Started AZO for pain    Patient reports Diabetes was diagnosed in 2021.   Family/Social History: Reports currently unemployed and living in a variety of places and having variable food availability.   Current diabetes medications include: insulin Fiasp (insulin aspart) 6-8 units TID with meals, insulin Basaglar (insulin glargine) 20 units once daily Current hypertension medications include: None Current hyperlipidemia medications include: rosuvastatin 10 mg once daily  Denies adherence to rosuvastatin. Reports adherence to both forms of his insulin.   Do you have any problems obtaining medications due to transportation or finances? yes Insurance coverage: [employer] & [place] Medicaid  Patient reports hypoglycemic events intermittently.  Patient reports nocturia (nighttime urination). 5-10x nightly  Patient reports neuropathy (nerve pain). Extreme pain from penis down to legs Patient reports visual changes. Occasional blurry vision  Patient reported dietary habits: Eats 2-5 meals/day (breakfast, dinner) -- Goal of: no carbs, potatoes Breakfast: oatmeal, eggs, [place] sausage/bacon, grits Dinner: greens, beef, chicken Snacks: carrots, salads, fruits Drinks: water, drinks juice when dizziness/lightheadedness comes on  Within the past 12 months, did you worry whether your food would run out before you got money to buy more? yes Within the past 12 months, did the food you bought run out, and you  didn't have money to get more? yes  O:   Review of Systems  Eyes:  Positive for blurred vision.  Genitourinary:  Positive for dysuria and frequency (nocturia (5-10x nightly)).  Neurological:  Positive for tingling (Diabetic neuropathy spreading from penis down both legs).       Pelvic / Leg Pain - described as burning  All other systems reviewed and are negative.   Physical Exam Vitals reviewed.  Constitutional:      Appearance: Normal appearance.  Pulmonary:     Effort: Pulmonary effort is normal.  Neurological:     Mental Status: He is alert.  Psychiatric:        Thought Content: Thought content normal.        Judgment: Judgment normal.    7 day average blood glucose: 191  Dexcom G7 CGM Download today on 07/12/2023 % Time CGM is active: 86.9% Average Glucose: 191 mg/dL Glucose Management Indicator: 7.9  Glucose Variability: 34.1% (goal <36%) Time in Goal:  - Time in range 70-180: 39% - Time above range: 60% - Time below range: 1%  Lab Results  Component Value Date   HGBA1C 8.9 (A) 06/10/2023   Vitals:   07/12/23 1117  BP: 99/74  Pulse: (!) 108  SpO2: 100%    Lipid Panel     Component Value Date/Time   CHOL 172 06/10/2023 1607   TRIG 100 06/10/2023 1607   HDL 62 06/10/2023 1607   CHOLHDL 2.8 06/10/2023 1607   CHOLHDL 5.2 01/25/2020 1308   VLDL 54 (H) 01/25/2020 1308   LDLCALC 92 06/10/2023 1607    Clinical Atherosclerotic Cardiovascular Disease (ASCVD): No  The 10-year ASCVD risk score (Arnett DK, et al., 2019) is:  9.6%   Values used to calculate the score:     Age: 52 years     Sex: Male     Is Non-Hispanic African American: Yes     Diabetic: Yes     Tobacco smoker: Yes     Systolic Blood Pressure: 99 mmHg     Is BP treated: No     HDL Cholesterol: 62 mg/dL     Total Cholesterol: 172 mg/dL   Patient is participating in a Managed Medicaid Plan:  Yes   A/P: Diabetes longstanding currently uncontrolled, but improving. Patient is able to  verbalize appropriate hypoglycemia management plan. Medication adherence appears good. Control is suboptimal due to various life stressors and constant pain. -Decreased insulin Fiasp (insulin aspart) 6-8 units TID with meals to 6-7 units TID with meals -Continued insulin Basaglar (insulin glargine) 20 units once daily -Patient educated on purpose, proper use, and potential adverse effects.  -Extensively discussed pathophysiology of diabetes, recommended lifestyle interventions, dietary effects on blood sugar control.  -Counseled on s/sx of and management of hypoglycemia.   ASCVD risk - primary prevention in patient with diabetes. Patient has been nonadherent to rosuvastatin, he only wants to take "the big medications".  Discussed the importance of this medication as it is "one of the big ones".  Patient willing to restart.  -Restarted rosuvastatin 10 mg once daily and encouraged adherence  Diabetic Neuropathy - uncontrolled pain radiating from penis down to both legs. Patient is taking duloxetine 60 mg BID to manage pain, with moderate improvement with use of medication, has approximately ~14 capsules remaining. Gabapentin and pregabalin both worsen his pain significantly. Reports at recent appointment with Dr. Marsh Dolly she was going to change his pain medication regimen and provide him with paperwork for child support.   I was unable to find any documents prepared for him to pick-up.  I also could not determine any alternative medication patient describes was discussed, and implemented by Dr. Marsh Dolly recently.   -Instructed to continue current supply (~ 7 days) of duloxetine.   -Follow-up with Dr. Marsh Dolly regarding pain management plan and documentation for child support within the next week should be anticipated from our office.   Tobacco Use Disorder - Increased frequency/amount of smoking due to stress.  -Discussed trying avoid increase tobacco intake when stressed.  Discussed options for trying  alternative approaches during stressful events/times. -No goal set for intake reduction   Written patient instructions provided. Patient verbalized understanding of treatment plan.  Total time in face to face counseling 39 minutes.    Follow-up:  Pharmacist in January PCP clinic visit in 08/12/2023 PRN Patient seen with Lendon Ka, PharmD Candidate and Shona Simpson, PharmD Candidate.

## 2023-07-12 NOTE — Assessment & Plan Note (Signed)
Diabetic Neuropathy - uncontrolled pain radiating from penis down to both legs. Patient is taking duloxetine 60 mg BID to manage pain, with moderate improvement with use of medication, has approximately ~14 capsules remaining. Gabapentin and pregabalin both worsen his pain significantly. Reports at recent appointment with Dr. Marsh Dolly she was going to change his pain medication regimen and provide him with paperwork for child support.   I was unable to find any documents prepared for him to pick-up.  I also could not determine any alternative medication patient describes was discussed, and implemented by Dr. Marsh Dolly recently.   -Instructed to continue current supply (~ 7 days) of duloxetine.   -Follow-up with Dr. Marsh Dolly regarding pain management plan and documentation for child support within the next week should be anticipated from our office.

## 2023-07-12 NOTE — Assessment & Plan Note (Signed)
ASCVD risk - primary prevention in patient with diabetes. Patient has been nonadherent to rosuvastatin, he only wants to take "the big medications".  Discussed the importance of this medication as it is "one of the big ones".  Patient willing to restart.  -Restarted rosuvastatin 10 mg once daily and encouraged adherence

## 2023-07-12 NOTE — Patient Instructions (Signed)
It was nice to see you today!  Your goal blood sugar is 80-130 before eating and less than 180 after eating.  Medication Changes: Continue insulin Fiasp (aspart) at 6-7 units with meals Continue insulin Basaglar (glargine) at 20 units once daily RESTART rosuvastatin 10 mg once daily  Reach out if you do not hear from your doctor soon about your pain management  Continue all other medication the same.   Keep up the good work with diet and exercise. Aim for a diet full of vegetables, fruit and lean meats (chicken, [place], fish). Try to limit salt intake by eating fresh or frozen vegetables (instead of canned), rinse canned vegetables prior to cooking and do not add any additional salt to meals.

## 2023-07-12 NOTE — Assessment & Plan Note (Signed)
Diabetes longstanding currently uncontrolled, but improving. Patient is able to verbalize appropriate hypoglycemia management plan. Medication adherence appears good. Control is suboptimal due to various life stressors and constant pain. -Decreased insulin Fiasp (insulin aspart) 6-8 units TID with meals to 6-7 units TID with meals -Continued insulin Basaglar (insulin glargine) 20 units once daily -Patient educated on purpose, proper use, and potential adverse effects.  -Extensively discussed pathophysiology of diabetes, recommended lifestyle interventions, dietary effects on blood sugar control.  -Counseled on s/sx of and management of hypoglycemia.   ASCVD risk - primary prevention in patient with diabetes. Patient has been nonadherent to rosuvastatin, he only wants to take "the big medications".  Discussed the importance of this medication as it is "one of the big ones".  Patient willing to restart.

## 2023-07-13 NOTE — Progress Notes (Signed)
Reviewed and agree with Dr Koval's plan.   

## 2023-07-15 ENCOUNTER — Telehealth: Payer: Self-pay

## 2023-07-15 NOTE — Telephone Encounter (Signed)
Patient calls nurse line in regards to diabetic nerve pain.   He reports he saw PCP on 11/6 to discuss concerns. He reports he can not tolerate Gabapentin or Lyrica. He reports Duloxetine does help, however "not that well."   He reports PCP was going to "come up with a plan" and give him a call.   He reports he has had a lot of sleep less nights due to the burning and tingling.   Advised will forward to PCP.

## 2023-07-18 NOTE — Progress Notes (Signed)
Returned patient call.   Patient reports worsening of his neuropathic pain.  States he has now run out of his diabetic nerve medication - duloxetine.  He asks any update on a treatment plan from his PCP, Dr. Marsh Dolly.   He requests refill of gabapentin if alternative plan not available.  Requests refill today -  [facility] [address] [place] verified as his pharmacy.

## 2023-07-19 ENCOUNTER — Encounter: Payer: Self-pay | Admitting: Family Medicine

## 2023-07-19 NOTE — Telephone Encounter (Signed)
Attempted to call patient yesterday to discuss pain medication and follow up. Patient did not answer phone will call again today (11/19).

## 2023-07-20 ENCOUNTER — Telehealth: Payer: Self-pay | Admitting: Family Medicine

## 2023-07-20 DIAGNOSIS — E1140 Type 2 diabetes mellitus with diabetic neuropathy, unspecified: Secondary | ICD-10-CM

## 2023-07-20 DIAGNOSIS — M4802 Spinal stenosis, cervical region: Secondary | ICD-10-CM

## 2023-07-20 MED ORDER — DULOXETINE HCL 60 MG PO CPEP: 60.0000 mg | ORAL_CAPSULE | Freq: Two times a day (BID) | ORAL | 0 refills | Status: DC

## 2023-07-20 MED ORDER — NORTRIPTYLINE HCL 25 MG PO CAPS: 25.0000 mg | ORAL_CAPSULE | Freq: Every day | ORAL | 0 refills | Status: DC

## 2023-07-20 NOTE — Telephone Encounter (Signed)
Called patient today regarding neuropathic pain in penis and legs.  Patient reported that pain in legs had worsened after he stopped taking cymbalta and lyrica. During our appointment he thought the medications were not having much of an effect, but he says after stopping them he felt his pain greatly intensify causing him to stay up at night and cry from the pain.   He also had tried over the counter azo instead of pyridium from the pharmacy due to insurance. Says this did not help his penile pain.   However, he did say he had some relief of his fecal incontinence after taking the flagyl. He still occasionally has some loose stool but not as much incontinence.    I discussed the pain regimen with him. We decided to try nortriptyline as it is a different class than those above. I also told him that I will send Cymbalta incase the nortriptyline does not work for him. Patient agreed with this plan.   Informed patient that likely his penile pain is not caused by prostatitis then, but would still like him to follow up with urology, .Hopefully pain will improve with new regimen.   I also mentioned to him that I would write a note for if patient is ever in jail or in court saying that patient has intense medical issues which require chronic medications and many appointments and that patient is disabled at this time, which affects his ability to make income.   I will also refer patient to medical-legal services.   Patient will follow up in about two weeks.

## 2023-07-22 ENCOUNTER — Other Ambulatory Visit: Payer: Self-pay | Admitting: Obstetrics and Gynecology

## 2023-07-22 DIAGNOSIS — E1100 Type 2 diabetes mellitus with hyperosmolarity without nonketotic hyperglycemic-hyperosmolar coma (NKHHC): Secondary | ICD-10-CM

## 2023-07-22 NOTE — Patient Instructions (Signed)
Hi Matthew Lynch, thank you for speaking with me this afternoon-I will relay your concerns to your PCP and our social worker.  I hope you have a nice weekend!  Matthew Lynch was given information about Medicaid Managed Care team care coordination services as a part of their Healthy [facility] Medicaid benefit. Matthew Lynch verbally consented to engagement with the [facility] Managed Care team.   If you are experiencing a medical emergency, please call 911 or report to your local emergency department or urgent care.   If you have a non-emergency medical problem during routine business hours, please contact your provider's office and ask to speak with a nurse.   For questions related to your Healthy [facility] health plan, please call: [phone] or visit the homepage here: [email or web address]  If you would like to schedule transportation through your Healthy [facility] plan, please call the following number at least 2 days in advance of your appointment: [phone]  For information about your ride after you set it up, call Ride Assist at [phone]. Use this number to activate a Will Call pickup, or if your transportation is late for a scheduled pickup. Use this number, too, if you need to make a change or cancel a previously scheduled reservation.  If you need transportation services right away, call [phone]. The after-hours call center is staffed 24 hours to handle ride assistance and urgent reservation requests (including discharges) 365 days a year. Urgent trips include sick visits, hospital discharge requests and life-sustaining treatment.  Call the [facility] Line at [phone], at any time, 24 hours a day, 7 days a week. If you are in danger or need immediate medical attention call 911.  If you would like help to quit smoking, call 1-800-QUIT-NOW (364-725-7392) OR Espaol: 1-855-Djelo-Ya (7-564-332-9518) o para ms informacin  haga clic aqu or Text READY to 841-660 to register via text  Matthew Lynch - following are the goals we discussed in your visit today:   Goals Addressed    Timeframe:  Long-Range Goal Priority:  High Start Date:      06/21/23                       Expected End Date:    ongoing                   Follow Up Date 08/22/23   - practice safe sex - schedule appointment for flu shot - schedule appointment for vaccines needed due to my age or health - schedule recommended health tests - schedule and keep appointment for annual check-up    Why is this important?   Screening tests can find diseases early when they are easier to treat.  Your doctor or nurse will talk with you about which tests are important for you.  Getting shots for common diseases like the flu and shingles will help prevent them.   07/22/23:  patient has BSW appt next week.  PCP 12/13.  Patient verbalizes understanding of instructions and care plan provided today and agrees to view in MyChart. Active MyChart status and patient understanding of how to access instructions and care plan via MyChart confirmed with patient.     The Managed Medicaid care management team will reach out to the patient again over the next 30 business  days.  The  Patient  has been provided with contact information for the Managed Medicaid care management team and has been advised to call with any health related questions  or concerns.   Kathi Der RN, BSN [place]  [facility] Care Management Coordinator - Managed Medicaid High Risk [phone]   Following is a copy of your plan of care:  Care Plan : RN Care Manager Plan of Care  Updates made by Danie Chandler, RN since 07/22/2023 12:00 AM     Problem: Health Promotion or Disease Self-Management (General Plan of Care)      Long-Range Goal: Chronic Disease Management and Care Coordination Needs   Start Date: 06/21/2023  Expected End Date: 09/21/2023  Priority: High  Note:    Current Barriers:  Knowledge Deficits related to plan of care for management of DM, neuropathy, HLD, chronic LBP, tobacco use, anxiety/depression, poor appetite, abdominal pain, fecal incontinence Care Coordination needs related to DM, neuropathy, HLD, chronic LBP, tobacco use, anxiety/depression, poor appetite, abdominal pain, fecal incontinence Chronic Disease Management support and education needs related to DM, neuropathy, HLD, chronic LBP, tobacco use, anxiety/depression, poor appetite, abdominal pain, fecal incontinence Financial Constraints, SDOH-job, housing 07/22/23:  patient very frustrated with his care and condition.  Neuropathy worse-trouble affording meds, needs resources.  Would like to get better control of his DM.  Smokes 4 cigarettes a day.  Blood sugars 194-203.  RNCM Clinical Goal(s):  verbalize understanding of plan for management of  DM, neuropathy, HLD, chronic LBP, tobacco use, anxiety/depression, poor appetite, abdominal pain, fecal incontinence   as evidenced by patient report verbalize basic understanding of  DM, neuropathy, HLD, chronic LBP, tobacco use, anxiety/depression, poor appetite, abdominal pain, fecal incontinence disease process and self health management plan as evidenced by patient report take all medications exactly as prescribed and will call provider for medication related questions as evidenced by patient report demonstrate understanding of rationale for each prescribed medication as evidenced by patient report attend all scheduled medical appointments as evidenced by patient report continue to work with RN Care Manager to address care management and care coordination needs related to DM, neuropathy, HLD, chronic LBP, tobacco use, anxiety/depression, poor appetite, abdominal pain, fecal incontinence as evidenced by adherence to CM Team Scheduled appointments work with Child psychotherapist to address  related to the management of resources, SDOH related to the  management of DM, neuropathy, HLD, chronic LBP, tobacco use, anxiety/depression, poor appetite, abdominal pain, fecal incontinence   as evidenced by review of EMR and patient or social worker report demonstrate ongoing self health care management ability  as evidenced by patient report and EMR review through collaboration with Medical illustrator, provider, and care team.   Interventions: Evaluation of current treatment plan related to  self management and patient's adherence to plan as established by provider 07/22/23:  [facility] sent messages to PCP and BSW to update.  Diabetes Interventions:  (Status:  New goal.) Long Term Goal Assessed patient's understanding of A1c goal: <7% Reviewed medications with patient and discussed importance of medication adherence Counseled on importance of regular laboratory monitoring as prescribed Discussed plans with patient for ongoing care management follow up and provided patient with direct contact information for care management team Reviewed scheduled/upcoming provider appointments Advised patient, providing education and rationale, to check cbg as directed  and record, calling provider  for findings outside established parameters Review of patient status, including review of consultants reports, relevant laboratory and other test results, and medications completed Assessed social determinant of health barriers Lab Results  Component Value Date   HGBA1C 8.9 (A) 06/10/2023   Hyperlipidemia Interventions:  (Status:  New goal.) Long Term Goal Medication review  performed; medication list updated in electronic medical record.  Counseled on importance of regular laboratory monitoring as prescribed Reviewed importance of limiting foods high in cholesterol Assessed social determinant of health barriers   Pain Interventions:  (Status:  New goal.) Long Term Goal Pain assessment performed Medications reviewed Discussed importance of adherence to all scheduled medical  appointments Reviewed with patient prescribed pharmacological and nonpharmacological pain relief strategies Assessed social determinant of health barriers  Smoking Cessation Interventions:  (Status:  New goal.) Long Term Goal Reviewed smoking history:   currently smoking 4 cigarettes a day Evaluation of current treatment plan reviewed Reviewed scheduled/upcoming provider appointments  Provided contact information for [place] [facility]uit Line (1-800-QUIT-NOW) Discussed plans with patient for ongoing care management follow up and provided patient with direct contact information for care management team Assessed social determinant of health barriers  SDOH Barriers (Status:  ) Long Term Goal Patient interviewed and SDOH assessment performed        SDOH Interventions    Flowsheet Row Patient Outreach Telephone from 06/21/2023 in [place] [facility] Care Coordination from 06/09/2023 in [facility] Care Coordination from 03/17/2023 in [facility] Office Visit from 02/18/2023 in [facility] Office Visit from 09/07/2022 in [facility] Office Visit from 05/17/2022 in [facility]  SDOH Interventions        Food Insecurity Interventions -- Other (Comment)  [receives food stamps limited to cooking locations when staying in car] Other (Comment)  [employer] Foodstamps but has no other income] -- -- --  Housing Interventions -- Other (Comment)  [referral to communityresources] -- -- -- --  Transportation Interventions -- Intervention Not Indicated  [can't afford insurance, gas, etc] --  [Has access to Medicaid transportation] -- -- --  Utilities Interventions -- -- Intervention Not Indicated -- -- --  Alcohol Usage Interventions -- Intervention Not Indicated (Score <7) -- -- -- --  Depression Interventions/Treatment  -- Medication, Counseling --  Counseling, Medication Currently on Treatment Counseling  Financial Strain Interventions -- Other (Comment)  [link with resources] -- -- -- --  Stress Interventions -- [employer], Provide Counseling -- -- -- --  Health Literacy Interventions Intervention Not Indicated -- -- -- -- --      Patient interviewed and appropriate assessments performed Referred patient to SW for assistance wit resources, SDOH Discussed plans with patient for ongoing care management follow up and provided patient with direct contact information for care management team  Patient Goals/Self-Care Activities: Take all medications as prescribed Attend all scheduled provider appointments Call pharmacy for medication refills 3-7 days in advance of running out of medications Perform all self care activities independently  Perform IADL's (shopping, preparing meals, housekeeping, managing finances) independently Call provider office for new concerns or questions  Work with the social worker to address care coordination needs and will continue to work with the clinical team to address health care and disease management related needs  Follow Up Plan:  The patient has been provided with contact information for the care management team and has been advised to call with any health related questions or concerns.  The care management team will reach out to the patient again over the next 30 business  days.

## 2023-07-22 NOTE — Patient Outreach (Signed)
Medicaid Managed Care   Nurse Care Manager Note  07/22/2023 Name:  Matthew Lynch MRN:  [id number] DOB:  03-12-1971  Matthew Lynch is an 52 y.o. year old male who is a primary patient of [name], MD.  The [facility] Managed Care Coordination team was consulted for assistance with:    Chronic healthcare management needs, DM, tobacco use, neuropathy, HLD, chronic LBP, anxiety/depression, incontinence, poor appetite, abdominal pain  Matthew Lynch was given information about Medicaid Managed Care Coordination team services today. Matthew Lynch Patient agreed to services and verbal consent obtained.  Engaged with patient by telephone for follow up visit in response to provider referral for case management and/or care coordination services.   Assessments/Interventions:  Review of past medical history, allergies, medications, health status, including review of consultants reports, laboratory and other test data, was performed as part of comprehensive evaluation and provision of chronic care management services.  SDOH (Social Determinants of Health) assessments and interventions performed: SDOH Interventions    Flowsheet Row Patient Outreach Telephone from 07/22/2023 in [place] POPULATION HEALTH DEPARTMENT Patient Outreach Telephone from 06/21/2023 in [place] POPULATION HEALTH DEPARTMENT Care Coordination from 06/09/2023 in [facility] Care Coordination Care Coordination from 03/17/2023 in [facility] Coord[facility] Office Visit from 02/18/2023 in [facility] - [facility] Dept Of [place]. [facility] Office Visit from 09/07/2022 in [facility] Of [place]. [facility]  SDOH Interventions        Food Insecurity Interventions -- -- Other (Comment)  [receives food stamps limited to cooking locations when staying in car] Other (Comment)  [Receives Foodstamps but has no other income] -- --  Housing  Interventions -- -- Other (Comment)  [referral to communityresources] -- -- --  Transportation Interventions -- -- Intervention Not Indicated  [can't afford insurance, gas, etc] --  [Has access to Medicaid transportation] -- --  Utilities Interventions Intervention Not Indicated -- -- Intervention Not Indicated -- --  Alcohol Usage Interventions Intervention Not Indicated (Score <7) -- Intervention Not Indicated (Score <7) -- -- --  Depression Interventions/Treatment  -- -- Medication, Counseling -- Counseling, Medication Currently on Treatment  Financial Strain Interventions -- -- Other (Comment)  [link with resources] -- -- --  Stress Interventions -- -- [employer], Provide Counseling -- -- --  Health Literacy Interventions -- Intervention Not Indicated -- -- -- --     Care Plan No Known Allergies  Medications Reviewed Today     Reviewed by Danie Chandler, RN (Registered Nurse) on 07/22/23 at 1445  Med List Status: <None>   Medication Order Taking? Sig Documenting Provider Last Dose Status Informant  Continuous Glucose Sensor (DEXCOM G7 SENSOR) MISC [id number]  1 Device by Does not apply route as directed. Apply new sensor every 10 days. [name], MD  Active   DULoxetine (CYMBALTA) 60 MG capsule [id number]  Take 1 capsule (60 mg total) by mouth 2 (two) times daily. [name], MD  Active   feeding supplement, GLUCERNA SHAKE, (GLUCERNA SHAKE) LIQD [id number]  Take 237 mLs by mouth 3 (three) times daily between meals. [name], MD  Active   insulin aspart (FIASP FLEXTOUCH) 100 UNIT/ML FlexTouch Pen [id number]  Inject 4-7 Units into the skin 3 (three) times daily before meals. McDiarmid, Leighton Roach, MD  Active   Insulin Glargine [facility]) 100 UNIT/ML [id number] No Inject 20 Units into the skin every morning. [name], MD Taking  Active   Insulin Pen Needle (BD PEN NEEDLE NANO U/F) 32G X 4 MM MISC [id number]  Use as directed once a day [name], MD  Active   loperamide (IMODIUM) 2 MG capsule [id number] No Take 2 mg by mouth daily as needed for diarrhea or loose stools. [provider] Taking Active            Med Note Raymondo Band, Joaquim Lai Jun 07, 2023  3:34 PM) Takes ~4 days weekly  nortriptyline (PAMELOR) 25 MG capsule [id number]  Take 1 capsule (25 mg total) by mouth at bedtime. [name], MD  Active   phenazopyridine (PYRIDIUM) 100 MG tablet [id number] No Take 1 tablet (100 mg total) by mouth 3 (three) times daily as needed for pain.  Patient not taking: Reported on 07/12/2023   [name], MD Not Taking Active   Pumpkin Seed-Soy Germ (AZO BLADDER CONTROL/GO-LESS PO) [id number]  Take 1 tablet by mouth. [provider]  Active   rosuvastatin (CRESTOR) 10 MG tablet [id number]  Take 1 tablet (10 mg total) by mouth daily. McDiarmid, Leighton Roach, MD  Active   triamcinolone ointment (KENALOG) 0.5 % [id number] No Apply 1 Application topically 2 (two) times daily. McDiarmid, Leighton Roach, MD Taking Active            Patient Active Problem List   Diagnosis Date Noted   Neuropathy due to type 2 diabetes mellitus (HCC) 05/17/2022   Underweight 05/17/2022   Bowel and bladder incontinence 04/26/2022   Abdominal pain 04/23/2022   Depression, recurrent (HCC) 01/29/2020   Hyperglycemia    Hyperlipidemia    Tobacco abuse    Anxiety and depression    Type 2 diabetes mellitus (HCC) 01/24/2020   Chronic bilateral low back pain without sciatica 08/24/2018   Conditions to be addressed/monitored per PCP order:  Chronic healthcare management needs, DM, tobacco use, neuropathy, HLD, chronic LBP, anxiety/depression, incontinence, poor appetite, abdominal pain  Care Plan : RN Care Manager Plan of Care  Updates made by Danie Chandler, RN since 07/22/2023 12:00 AM     Problem: Health Promotion or Disease Self-Management (General Plan of Care)      Long-Range Goal: Chronic Disease Management and Care Coordination Needs    Start Date: 06/21/2023  Expected End Date: 09/21/2023  Priority: High  Note:   Current Barriers:  Knowledge Deficits related to plan of care for management of DM, neuropathy, HLD, chronic LBP, tobacco use, anxiety/depression, poor appetite, abdominal pain, fecal incontinence Care Coordination needs related to DM, neuropathy, HLD, chronic LBP, tobacco use, anxiety/depression, poor appetite, abdominal pain, fecal incontinence Chronic Disease Management support and education needs related to DM, neuropathy, HLD, chronic LBP, tobacco use, anxiety/depression, poor appetite, abdominal pain, fecal incontinence Financial Constraints, SDOH-job, housing 07/22/23:  patient very frustrated with his care and condition.  Neuropathy worse-trouble affording meds, needs resources.  Would like to get better control of his DM.  Smokes 4 cigarettes a day.  Blood sugars 194-203.  RNCM Clinical Goal(s):  verbalize understanding of plan for management of  DM, neuropathy, HLD, chronic LBP, tobacco use, anxiety/depression, poor appetite, abdominal pain, fecal incontinence   as evidenced by patient report verbalize basic understanding of  DM, neuropathy, HLD, chronic LBP, tobacco use, anxiety/depression, poor appetite, abdominal pain, fecal incontinence disease process and self health management plan as evidenced by patient report take all medications exactly as prescribed and will call provider for medication related questions as evidenced by patient report demonstrate understanding of rationale  for each prescribed medication as evidenced by patient report attend all scheduled medical appointments as evidenced by patient report continue to work with RN Care Manager to address care management and care coordination needs related to DM, neuropathy, HLD, chronic LBP, tobacco use, anxiety/depression, poor appetite, abdominal pain, fecal incontinence as evidenced by adherence to CM Team Scheduled appointments work with Arts development officer to address  related to the management of resources, SDOH related to the management of DM, neuropathy, HLD, chronic LBP, tobacco use, anxiety/depression, poor appetite, abdominal pain, fecal incontinence   as evidenced by review of EMR and patient or social worker report demonstrate ongoing self health care management ability  as evidenced by patient report and EMR review through collaboration with Medical illustrator, provider, and care team.   Interventions: Evaluation of current treatment plan related to  self management and patient's adherence to plan as established by provider 07/22/23:  [facility] sent messages to PCP and BSW to update.   Diabetes Interventions:  (Status:  New goal.) Long Term Goal Assessed patient's understanding of A1c goal: <7% Reviewed medications with patient and discussed importance of medication adherence Counseled on importance of regular laboratory monitoring as prescribed Discussed plans with patient for ongoing care management follow up and provided patient with direct contact information for care management team Reviewed scheduled/upcoming provider appointments Advised patient, providing education and rationale, to check cbg as directed  and record, calling provider  for findings outside established parameters Review of patient status, including review of consultants reports, relevant laboratory and other test results, and medications completed Assessed social determinant of health barriers Lab Results  Component Value Date   HGBA1C 8.9 (A) 06/10/2023   Hyperlipidemia Interventions:  (Status:  New goal.) Long Term Goal Medication review performed; medication list updated in electronic medical record.  Counseled on importance of regular laboratory monitoring as prescribed Reviewed importance of limiting foods high in cholesterol Assessed social determinant of health barriers   Pain Interventions:  (Status:  New goal.) Long Term Goal Pain assessment  performed Medications reviewed Discussed importance of adherence to all scheduled medical appointments Reviewed with patient prescribed pharmacological and nonpharmacological pain relief strategies Assessed social determinant of health barriers  Smoking Cessation Interventions:  (Status:  New goal.) Long Term Goal Reviewed smoking history:   currently smoking 4 cigarettes a day Evaluation of current treatment plan reviewed Reviewed scheduled/upcoming provider appointments  Provided contact information for [place] Quit Line (1-800-QUIT-NOW) Discussed plans with patient for ongoing care management follow up and provided patient with direct contact information for care management team Assessed social determinant of health barriers  SDOH Barriers (Status:  ) Long Term Goal Patient interviewed and SDOH assessment performed        SDOH Interventions    Flowsheet Row Patient Outreach Telephone from 06/21/2023 in [place] POPULATION HEALTH DEPARTMENT Care Coordination from 06/09/2023 in [facility] Care Coordination Care Coordination from 03/17/2023 in [facility] Care Coordination Office Visit from 02/18/2023 in [facility] Office Visit from 09/07/2022 in [facility] Office Visit from 05/17/2022 in [facility]  SDOH Interventions        Food Insecurity Interventions -- Other (Comment)  [receives food stamps limited to cooking locations when staying in car] Other (Comment)  Dolores Lory Foodstamps but has no other income] -- -- --  Housing Interventions -- Other (Comment)  [referral to communityresources] -- -- -- --  Transportation Interventions -- Intervention Not Indicated  [  can't afford insurance, gas, etc] --  [Has access to Medicaid transportation] -- -- --  Utilities Interventions -- -- Intervention Not Indicated -- -- --  Alcohol Usage Interventions -- Intervention Not Indicated (Score  <7) -- -- -- --  Depression Interventions/Treatment  -- Medication, Counseling -- Counseling, Medication Currently on Treatment Counseling  Financial Strain Interventions -- Other (Comment)  [link with resources] -- -- -- --  Stress Interventions -- [employer], Provide Counseling -- -- -- --  Health Literacy Interventions Intervention Not Indicated -- -- -- -- --      Patient interviewed and appropriate assessments performed Referred patient to SW for assistance wit resources, SDOH Discussed plans with patient for ongoing care management follow up and provided patient with direct contact information for care management team  Patient Goals/Self-Care Activities: Take all medications as prescribed Attend all scheduled provider appointments Call pharmacy for medication refills 3-7 days in advance of running out of medications Perform all self care activities independently  Perform IADL's (shopping, preparing meals, housekeeping, managing finances) independently Call provider office for new concerns or questions  Work with the social worker to address care coordination needs and will continue to work with the clinical team to address health care and disease management related needs  Follow Up Plan:  The patient has been provided with contact information for the care management team and has been advised to call with any health related questions or concerns.  The care management team will reach out to the patient again over the next 30 business  days.   Long-Range Goal: Establish Plan of care for Chronic Disease Management Needs and SDOH Barriers   Priority: High  Note:   Timeframe:  Long-Range Goal Priority:  High Start Date:      06/21/23                       Expected End Date:    ongoing                   Follow Up Date 08/22/23   - practice safe sex - schedule appointment for flu shot - schedule appointment for vaccines needed due to my age or health -  schedule recommended health tests - schedule and keep appointment for annual check-up    Why is this important?   Screening tests can find diseases early when they are easier to treat.  Your doctor or nurse will talk with you about which tests are important for you.  Getting shots for common diseases like the flu and shingles will help prevent them.   07/22/23:  patient has BSW appt next week.  PCP 12/13.   Follow Up:  Patient agrees to Care Plan and Follow-up.  Plan: The Managed Medicaid care management team will reach out to the patient again over the next 30 buai days. and The  Patient has been provided with contact information for the Managed Medicaid care management team and has been advised to call with any health related questions or concerns.  Date/time of next scheduled RN care management/care coordination outreach:  08/22/23 at [phone]

## 2023-07-25 NOTE — Addendum Note (Signed)
Addended by: [name] on: 07/25/2023 01:19 PM   Modules accepted: Orders

## 2023-07-26 ENCOUNTER — Other Ambulatory Visit: Payer: Self-pay

## 2023-07-26 NOTE — Patient Outreach (Signed)
Medicaid Managed Care Social Work Note  07/26/2023 Name:  Matthew Lynch MRN:  [id number] DOB:  1971-04-15  Matthew Lynch is an 52 y.o. year old male who is a primary patient of Matthew Mola, MD.  The Medicaid Managed Care Coordination team was consulted for assistance with:  Community Resources   Matthew Lynch was given information about Medicaid Managed Care Coordination team services today. Matthew Lynch Patient agreed to services and verbal consent obtained.  Engaged with patient  for by telephone forfollow up visit in response to referral for case management and/or care coordination services.   Assessments/Interventions:  Review of past medical history, allergies, medications, health status, including review of consultants reports, laboratory and other test data, was performed as part of comprehensive evaluation and provision of chronic care management services.  SDOH: (Social Determinant of Health) assessments and interventions performed: SDOH Interventions    Flowsheet Row Patient Outreach Telephone from 07/22/2023 in [place] POPULATION HEALTH DEPARTMENT Patient Outreach Telephone from 06/21/2023 in  POPULATION HEALTH DEPARTMENT Care Coordination from 06/09/2023 in [facility] Care Coordination Care Coordination from 03/17/2023 in [facility] Care Coordination Office Visit from 02/18/2023 in [facility] Of [place] Office Visit from 09/07/2022 in [facility] Dept Of [place]. [facility]  SDOH Interventions        Food Insecurity Interventions -- -- Other (Comment)  [receives food stamps limited to cooking locations when staying in car] Other (Comment)  [Receives Foodstamps but has no other income] -- --  Housing Interventions -- -- Other (Comment)  [referral to communityresources] -- -- --  Transportation Interventions -- -- Intervention Not Indicated   [can't afford insurance, gas, etc] --  [Has access to Medicaid transportation] -- --  Utilities Interventions Intervention Not Indicated -- -- Intervention Not Indicated -- --  Alcohol Usage Interventions Intervention Not Indicated (Score <7) -- Intervention Not Indicated (Score <7) -- -- --  Depression Interventions/Treatment  -- -- Medication, Counseling -- Counseling, Medication Currently on Treatment  Financial Strain Interventions -- -- Other (Comment)  [link with resources] -- -- --  Stress Interventions -- -- [employer], Provide Counseling -- -- --  Health Literacy Interventions -- Intervention Not Indicated -- -- -- --     BSW completed a telephone outreach with patient, he states he did not check his email to see if he received the email BSW sent to him. Patient asked if resources could be sent via mychart. BSW will send resources via mychart. No additional resources are needed. Patient is still living with mother.  Advanced Directives Status:  Not addressed in this encounter.  Care Plan                 No Known Allergies  Medications Reviewed Today   Medications were not reviewed in this encounter     Patient Active Problem List   Diagnosis Date Noted   Neuropathy due to type 2 diabetes mellitus (HCC) 05/17/2022   Underweight 05/17/2022   Bowel and bladder incontinence 04/26/2022   Abdominal pain 04/23/2022   Depression, recurrent (HCC) 01/29/2020   Hyperglycemia    Hyperlipidemia    Tobacco abuse    Anxiety and depression    Type 2 diabetes mellitus (HCC) 01/24/2020   Chronic bilateral low back pain without sciatica 08/24/2018    Conditions to be addressed/monitored per PCP order:   housing and disability resources  There are no care plans that you recently modified to display for this patient.   Follow up:  Patient agrees to Care Plan and Follow-up.  Plan: The Managed Medicaid care management team will reach out to the patient again  over the next 30 days.  Date/time of next scheduled Social Work care management/care coordination outreach:  08/23/23  Matthew Lynch, Matthew Lynch, [facility]  Managed [facility] Social Worker [phone]

## 2023-07-26 NOTE — Patient Instructions (Signed)
Visit Information  Matthew Lynch was given information about Medicaid Managed Care team care coordination services as a part of their Healthy [facility] Medicaid benefit. Mohnish Mcglynn verbally consented to engagement with the [facility] Managed Care team.   If you are experiencing a medical emergency, please call 911 or report to your local emergency department or urgent care.   If you have a non-emergency medical problem during routine business hours, please contact your provider's office and ask to speak with a nurse.   For questions related to your Healthy [facility] health plan, please call: [phone] or visit the homepage here: [email or web address]  If you would like to schedule transportation through your Healthy [facility] plan, please call the following number at least 2 days in advance of your appointment: [phone]  For information about your ride after you set it up, call Ride Assist at [phone]. Use this number to activate a Will Call pickup, or if your transportation is late for a scheduled pickup. Use this number, too, if you need to make a change or cancel a previously scheduled reservation.  If you need transportation services right away, call [phone]. The after-hours call center is staffed 24 hours to handle ride assistance and urgent reservation requests (including discharges) 365 days a year. Urgent trips include sick visits, hospital discharge requests and life-sustaining treatment.  Call the [facility] Line at [phone], at any time, 24 hours a day, 7 days a week. If you are in danger or need immediate medical attention call 911.  If you would like help to quit smoking, call 1-800-QUIT-NOW (479-101-6824) OR Espaol: 1-855-Djelo-Ya (2-355-732-2025) o para ms informacin haga clic aqu or Text READY to 427-062 to register via text  Matthew Lynch - following are the goals we discussed in your visit today:    Goals Addressed   None      Social Worker will follow up in 30 days.   Gus Puma, Kenard Gower, MHA [facility]  Managed Medicaid Social Worker [phone]   Following is a copy of your plan of care:  There are no care plans that you recently modified to display for this patient.

## 2023-08-12 ENCOUNTER — Ambulatory Visit (INDEPENDENT_AMBULATORY_CARE_PROVIDER_SITE_OTHER): Payer: 59 | Admitting: Family Medicine

## 2023-08-12 VITALS — BP 108/76 | HR 83 | Ht 65.0 in | Wt 112.4 lb

## 2023-08-12 DIAGNOSIS — E1140 Type 2 diabetes mellitus with diabetic neuropathy, unspecified: Secondary | ICD-10-CM | POA: Diagnosis not present

## 2023-08-12 NOTE — Progress Notes (Unsigned)
    SUBJECTIVE:   CHIEF COMPLAINT / HPI:   F/u Lower Extremity Neuropathy  Patient reports that nortriptyine did not have any effect on neuropathy and would just make him feel a little dizzy at night. He says cymbalta would "lock" the sensation in place but he preferred this compared to the oscillating sensations he'd have previously. No weakness. Patient says his stool incontinence improved with metronidazole but after finishing treatment it went back to loose stool. 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 Monofilament testing with good sensation throughout bilaterally. 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 Cymbalta is controlling symptomsa t this time. Will be candidate for neurosurgery after A1c < 7. CGM proxy for A1c 7.9. improved from prior.  - Continue cymbalta, discontinue nortriptyline  - Follow up in 2 months      [name], MD [facility]

## 2023-08-12 NOTE — Patient Instructions (Addendum)
Thank you for coming in today!   I am glad you are feeling a little bit better mood wise.   We will stop the nortriptyline as it did not help your symptoms. Continue the cymbalta.   Please follow up with these referrals to make an appointment. I will follow up about the endocrinologist.   [facility] [address] [place] [address] [phone]  [facility] [address] [place] [facility] 2nd floor [phone]  Please message me on mychart if you have further concerns in the meantime. You are doing great!

## 2023-08-14 NOTE — Assessment & Plan Note (Signed)
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 Cymbalta is controlling symptomsa t this time. Will be candidate for neurosurgery after A1c < 7. CGM proxy for A1c 7.9. improved from prior.  - Continue cymbalta, discontinue nortriptyline  - Follow up in 2 months

## 2023-08-15 ENCOUNTER — Telehealth: Payer: Self-pay

## 2023-08-15 NOTE — Telephone Encounter (Signed)
Patient calls nurse line requesting an additional letter of support from PCP.   He reports he turned in the letter written by PCP from 11/20.  He reports Social Security denied his food stamps and he reports continued issues with his "child support" case.  He reports the letter needs to state in detail why he is disabled and can not work with a list of diagnoses.   Advised will forward to PCP for advisement.

## 2023-08-20 ENCOUNTER — Encounter: Payer: Self-pay | Admitting: Family Medicine

## 2023-08-22 ENCOUNTER — Other Ambulatory Visit: Payer: Self-pay | Admitting: Obstetrics and Gynecology

## 2023-08-22 NOTE — Patient Instructions (Signed)
Hi Matthew Lynch, I am sorry I missed you today, I hope you are okay - as a part of your Medicaid benefit, you are eligible for care management and care coordination services at no cost or copay. I was unable to reach you by phone today but would be happy to help you with your health related needs. Please feel free to call me at [phone].  A member of the Managed Medicaid care management team will reach out to you again over the next 30 business  days.   Kathi Der RN, BSN [place]  [employer], production - Managed Medicaid High Risk [phone]

## 2023-08-22 NOTE — Patient Outreach (Signed)
  Medicaid Managed Care   Unsuccessful Attempt Note   08/22/2023 Name: Matthew Lynch MRN: [id number] DOB: December 16, 1970  Referred by: [name], MD Reason for referral : High Risk Managed Medicaid (Unsuccessful telephone outreach)  An unsuccessful telephone outreach was attempted today. The patient was referred to the case management team for assistance with care management and care coordination.    Follow Up Plan: The Managed Medicaid care management team will reach out to the patient again over the next 30 business  days. and The  Patient has been provided with contact information for the Managed Medicaid care management team and has been advised to call with any health related questions or concerns.   Kathi Der RN, BSN [place]  [employer], production - Managed Medicaid High Risk [phone]

## 2023-08-22 NOTE — Telephone Encounter (Signed)
Attempted to call patient to advise of letter.   No answer or option for for VM.  If he calls back please him know he can access his letter in his mychart.

## 2023-08-25 ENCOUNTER — Other Ambulatory Visit: Payer: Self-pay

## 2023-08-25 ENCOUNTER — Encounter: Payer: Self-pay | Admitting: Family Medicine

## 2023-08-25 ENCOUNTER — Telehealth: Payer: Self-pay | Admitting: Family Medicine

## 2023-08-25 DIAGNOSIS — E1165 Type 2 diabetes mellitus with hyperglycemia: Secondary | ICD-10-CM

## 2023-08-25 MED ORDER — FIASP FLEXTOUCH 100 UNIT/ML ~~LOC~~ SOPN: 4.0000 [IU] | PEN_INJECTOR | Freq: Three times a day (TID) | SUBCUTANEOUS | 2 refills | Status: DC

## 2023-08-25 NOTE — Telephone Encounter (Signed)
Patient calls nurse line regarding issues with obtaining fiasp flexpen.   He reports that he ran out of insulin yesterday. Last rx was written on 07/12/23, however, was ordered as no print.   Will forward to Dr. McDiarmid to resend prescription.   Patient is asking that rx be sent over as soon as possible as he ran out of insulin yesterday.   Veronda Prude, RN

## 2023-08-25 NOTE — Telephone Encounter (Signed)
Prescription for 9 ml Fiasp with two refills to pharmacy

## 2023-08-25 NOTE — Telephone Encounter (Signed)
Patient called stating he needed a refill. I sent him to the nurse line, he called right back because they didn't answer. He is needing a refill on his Fiasp Flextouch insulin.   He is needing it sent to the [facility] pharmacy on [address]   Please Advise.   Thanks!

## 2023-08-25 NOTE — Patient Instructions (Signed)
 Visit Information  Matthew Lynch was given information about Medicaid Managed Care team care coordination services as a part of their Healthy [facility] Medicaid benefit. Mohnish Mcglynn verbally consented to engagement with the [facility] Managed Care team.   If you are experiencing a medical emergency, please call 911 or report to your local emergency department or urgent care.   If you have a non-emergency medical problem during routine business hours, please contact your provider's office and ask to speak with a nurse.   For questions related to your Healthy [facility] health plan, please call: [phone] or visit the homepage here: [email or web address]  If you would like to schedule transportation through your Healthy [facility] plan, please call the following number at least 2 days in advance of your appointment: [phone]  For information about your ride after you set it up, call Ride Assist at [phone]. Use this number to activate a Will Call pickup, or if your transportation is late for a scheduled pickup. Use this number, too, if you need to make a change or cancel a previously scheduled reservation.  If you need transportation services right away, call [phone]. The after-hours call center is staffed 24 hours to handle ride assistance and urgent reservation requests (including discharges) 365 days a year. Urgent trips include sick visits, hospital discharge requests and life-sustaining treatment.  Call the [facility] Line at [phone], at any time, 24 hours a day, 7 days a week. If you are in danger or need immediate medical attention call 911.  If you would like help to quit smoking, call 1-800-QUIT-NOW (479-101-6824) OR Espaol: 1-855-Djelo-Ya (2-355-732-2025) o para ms informacin haga clic aqu or Text READY to 427-062 to register via text  Matthew Lynch - following are the goals we discussed in your visit today:    Goals Addressed   None      Social Worker will follow up in 30 days.   Gus Puma, Kenard Gower, MHA [facility] Health  Managed Medicaid Social Worker [phone]   Following is a copy of your plan of care:  There are no care plans that you recently modified to display for this patient.

## 2023-08-25 NOTE — Addendum Note (Signed)
Addended by: [name] on: 08/25/2023 05:04 PM   Modules accepted: Orders

## 2023-08-25 NOTE — Patient Outreach (Signed)
Medicaid Managed Care Social Work Note  08/25/2023 Name:  Matthew Lynch MRN:  [id number] DOB:  [birth year]  Matthew Lynch is an 52 y.o. year old male who is a primary patient of [name], MD.  The Medicaid Managed Care Coordination team was consulted for assistance with:  Community Resources   Matthew Lynch was given information about Medicaid Managed Care Coordination team services today. Matthew Lynch Patient agreed to services and verbal consent obtained.  Engaged with patient  for by telephone forfollow up visit in response to referral for case management and/or care coordination services.   Patient is participating in a Managed Medicaid Plan:  Yes  Assessments/Interventions:  Review of past medical history, allergies, medications, health status, including review of consultants reports, laboratory and other test data, was performed as part of comprehensive evaluation and provision of chronic care management services.  SDOH: (Social Drivers of Health) assessments and interventions performed: SDOH Interventions    Flowsheet Row Patient Outreach Telephone from 07/22/2023 in [place] POPULATION HEALTH DEPARTMENT Patient Outreach Telephone from 06/21/2023 in [place] POPULATION HEALTH DEPARTMENT Care Coordination from 06/09/2023 in [facility] Care Coordination Care Coordination from 03/17/2023 in [facility] Care Coordination Office Visit from 02/18/2023 in [facility] [place] Office Visit from 09/07/2022 in [facility] [place]  SDOH Interventions        Food Insecurity Interventions -- -- Other (Comment)  [receives food stamps limited to cooking locations when staying in car] Other (Comment)  [Receives Foodstamps but has no other income] -- --  Housing Interventions -- -- Other (Comment)  [referral to communityresources] -- -- --   Transportation Interventions -- -- Intervention Not Indicated  [can't afford insurance, gas, etc] --  [Has access to Medicaid transportation] -- --  Utilities Interventions Intervention Not Indicated -- -- Intervention Not Indicated -- --  Alcohol Usage Interventions Intervention Not Indicated (Score <7) -- Intervention Not Indicated (Score <7) -- -- --  Depression Interventions/Treatment  -- -- Medication, Counseling -- Counseling, Medication Currently on Treatment  Financial Strain Interventions -- -- Other (Comment)  [link with resources] -- -- --  Stress Interventions -- -- [employer], Provide Counseling -- -- --  Health Literacy Interventions -- Intervention Not Indicated -- -- -- --      BSW completed a telephone outreach with patient, he states he wantes to know how to get food. Patient states he has an appointment with disability on 1/8 and needs a letter stating he is unable to work. BSW will send a message to PCP. Patient still has not located housing, he states after speaking with housing they are stating that he has to have income. No other resources are needed at this time. A legal aid referral has been created for patient.  Advanced Directives Status:  Not addressed in this encounter.  Care Plan                 No Known Allergies  Medications Reviewed Today   Medications were not reviewed in this encounter     Patient Active Problem List   Diagnosis Date Noted   Neuropathy due to type 2 diabetes mellitus (HCC) 05/17/2022   Underweight 05/17/2022   Bowel and bladder incontinence 04/26/2022   Abdominal pain 04/23/2022   Depression, recurrent (HCC) 01/29/2020   Hyperglycemia    Hyperlipidemia    Tobacco  abuse    Anxiety and depression    Type 2 diabetes mellitus (HCC) 01/24/2020   Chronic bilateral low back pain without sciatica 08/24/2018    Conditions to be addressed/monitored per PCP order:   community resources  There are no care plans  that you recently modified to display for this patient.   Follow up:  Patient agrees to Care Plan and Follow-up.  Plan: The Managed Medicaid care management team will reach out to the patient again over the next 30 days.  Date/time of next scheduled Social Work care management/care coordination outreach:  09/26/23  Matthew Lynch, Matthew Lynch, [facility] Health  Managed [facility] Social Worker [phone]

## 2023-09-06 ENCOUNTER — Other Ambulatory Visit: Payer: Self-pay | Admitting: Obstetrics and Gynecology

## 2023-09-06 NOTE — Patient Instructions (Signed)
 Hi Mr. Rondeau, thank you for the updates, have a nice afternoon!  Mr. Lortie was given information about Medicaid Managed Care team care coordination services as a part of their Healthy [facility] Medicaid benefit. Otha Monical verbally consented to engagement with the [facility] Managed Care team.   If you are experiencing a medical emergency, please call 911 or report to your local emergency department or urgent care.   If you have a non-emergency medical problem during routine business hours, please contact your provider's office and ask to speak with a nurse.   For questions related to your Healthy [facility] health plan, please call: [phone] or visit the homepage here: [email or web address]  If you would like to schedule transportation through your Healthy [facility] plan, please call the following number at least 2 days in advance of your appointment: [phone]  For information about your ride after you set it up, call Ride Assist at [phone]. Use this number to activate a Will Call pickup, or if your transportation is late for a scheduled pickup. Use this number, too, if you need to make a change or cancel a previously scheduled reservation.  If you need transportation services right away, call [phone]. The after-hours call center is staffed 24 hours to handle ride assistance and urgent reservation requests (including discharges) 365 days a year. Urgent trips include sick visits, hospital discharge requests and life-sustaining treatment.  Call the [facility] Line at [phone], at any time, 24 hours a day, 7 days a week. If you are in danger or need immediate medical attention call 911.  If you would like help to quit smoking, call 1-800-QUIT-NOW ((401) 151-5818) OR Espaol: 1-855-Djelo-Ya (8-144-664-6430) o para ms informacin haga clic aqu or Text READY to 799-599 to register via text  Mr. Lang - following are  the goals we discussed in your visit today:   Goals Addressed    Timeframe:  Long-Range Goal Priority:  High Start Date:      06/21/23                       Expected End Date:    ongoing                   Follow Up Date 10/26/23   - practice safe sex - schedule appointment for flu shot - schedule appointment for vaccines needed due to my age or health - schedule recommended health tests - schedule and keep appointment for annual check-up    Why is this important?   Screening tests can find diseases early when they are easier to treat.  Your doctor or nurse will talk with you about which tests are important for you.  Getting shots for common diseases like the flu and shingles will help prevent them.   09/06/23:  patient has GI appt 2/12, PCP 2/25, ? URO appt  Patient verbalizes understanding of instructions and care plan provided today and agrees to view in MyChart. Active MyChart status and patient understanding of how to access instructions and care plan via MyChart confirmed with patient.     The Managed Medicaid care management team will reach out to the patient again over the next 60 business  days.  The  Patient  has been provided with contact information for the Managed Medicaid care management team and has been advised to call with any health related questions or concerns.   Veva Angles RN, BSN [place]  [employer], [employer] -  Managed Medicaid High Risk [phone]   Following is a copy of your plan of care:  Care Plan : RN Care Manager Plan of Care  Updates made by Milon Veva MATSU, RN since 09/06/2023 12:00 AM     Problem: Health Promotion or Disease Self-Management (General Plan of Care)      Long-Range Goal: Chronic Disease Management and Care Coordination Needs   Start Date: 06/21/2023  Expected End Date: 12/05/2023  Priority: High  Note:   Current Barriers:  Knowledge Deficits related to plan of care for management of DM,  neuropathy, HLD, chronic LBP, tobacco use, anxiety/depression, poor appetite, abdominal pain, fecal incontinence Care Coordination needs related to DM, neuropathy, HLD, chronic LBP, tobacco use, anxiety/depression, poor appetite, abdominal pain, fecal incontinence Chronic Disease Management support and education needs related to DM, neuropathy, HLD, chronic LBP, tobacco use, anxiety/depression, poor appetite, abdominal pain, fecal incontinence Financial Constraints, SDOH-job, housing 09/06/23:  Patient states average blood sugar 230.  Has upcoming GI, PCP, ? URO appt.  Resources received from BSW.  Has Disability appt tomorrow.  Smokes 4 cigarettes a day.  Main issue is abdominal pain/neuropathy pain-has appts scheduled.  RNCM Clinical Goal(s):  verbalize understanding of plan for management of  DM, neuropathy, HLD, chronic LBP, tobacco use, anxiety/depression, poor appetite, abdominal pain, fecal incontinence   as evidenced by patient report verbalize basic understanding of  DM, neuropathy, HLD, chronic LBP, tobacco use, anxiety/depression, poor appetite, abdominal pain, fecal incontinence disease process and self health management plan as evidenced by patient report take all medications exactly as prescribed and will call provider for medication related questions as evidenced by patient report demonstrate understanding of rationale for each prescribed medication as evidenced by patient report attend all scheduled medical appointments as evidenced by patient report continue to work with RN Care Manager to address care management and care coordination needs related to DM, neuropathy, HLD, chronic LBP, tobacco use, anxiety/depression, poor appetite, abdominal pain, fecal incontinence as evidenced by adherence to CM Team Scheduled appointments work with child psychotherapist to address  related to the management of resources, SDOH related to the management of DM, neuropathy, HLD, chronic LBP, tobacco use,  anxiety/depression, poor appetite, abdominal pain, fecal incontinence   as evidenced by review of EMR and patient or social worker report demonstrate ongoing self health care management ability  as evidenced by patient report and EMR review through collaboration with Medical Illustrator, provider, and care team.   Interventions: Evaluation of current treatment plan related to  self management and patient's adherence to plan as established by provider 07/22/23:  [facility] sent messages to PCP and BSW to update.  Diabetes Interventions:  (Status:  New goal.) Long Term Goal Assessed patient's understanding of A1c goal: <7% Reviewed medications with patient and discussed importance of medication adherence Counseled on importance of regular laboratory monitoring as prescribed Discussed plans with patient for ongoing care management follow up and provided patient with direct contact information for care management team Reviewed scheduled/upcoming provider appointments Advised patient, providing education and rationale, to check cbg as directed  and record, calling provider  for findings outside established parameters Review of patient status, including review of consultants reports, relevant laboratory and other test results, and medications completed Assessed social determinant of health barriers Lab Results  Component Value Date   HGBA1C 8.9 (A) 06/10/2023   Hyperlipidemia Interventions:  (Status:  New goal.) Long Term Goal Medication review performed; medication list updated in electronic medical record.  Counseled on importance of regular  laboratory monitoring as prescribed Reviewed importance of limiting foods high in cholesterol Assessed social determinant of health barriers   Pain Interventions:  (Status:  New goal.) Long Term Goal Pain assessment performed Medications reviewed Discussed importance of adherence to all scheduled medical appointments Reviewed with patient prescribed  pharmacological and nonpharmacological pain relief strategies Assessed social determinant of health barriers  Smoking Cessation Interventions:  (Status:  New goal.) Long Term Goal Reviewed smoking history:   currently smoking 4 cigarettes a day Evaluation of current treatment plan reviewed Reviewed scheduled/upcoming provider appointments  Provided contact information for [place] Quit Line (1-800-QUIT-NOW) Discussed plans with patient for ongoing care management follow up and provided patient with direct contact information for care management team Assessed social determinant of health barriers  SDOH Barriers (Status:  ) Long Term Goal Patient interviewed and SDOH assessment performed        SDOH Interventions    Flowsheet Row Patient Outreach Telephone from 06/21/2023 in [place] POPULATION HEALTH DEPARTMENT Care Coordination from 06/09/2023 in [facility] Care Coordination Care Coordination from 03/17/2023 in [facility] [facility] Care Coordination Office Visit from 02/18/2023 in [facility] Office Visit from 09/07/2022 in [facility] Office Visit from 05/17/2022 in [facility]  SDOH Interventions        Food Insecurity Interventions -- Other (Comment)  [receives food stamps limited to cooking locations when staying in car] Other (Comment)  [place] Foodstamps but has no other income] -- -- --  Housing Interventions -- Other (Comment)  [referral to communityresources] -- -- -- --  Transportation Interventions -- Intervention Not Indicated  [can't afford insurance, gas, etc] --  [Has access to Medicaid transportation] -- -- --  Utilities Interventions -- -- Intervention Not Indicated -- -- --  Alcohol Usage Interventions -- Intervention Not Indicated (Score <7) -- -- -- --  Depression Interventions/Treatment  -- Medication, Counseling -- Counseling, Medication Currently on Treatment Counseling   Financial Strain Interventions -- Other (Comment)  [link with resources] -- -- -- --  Stress Interventions -- [employer], Provide Counseling -- -- -- --  Health Literacy Interventions Intervention Not Indicated -- -- -- -- --      Patient interviewed and appropriate assessments performed Referred patient to SW for assistance wit resources, SDOH Discussed plans with patient for ongoing care management follow up and provided patient with direct contact information for care management team  Patient Goals/Self-Care Activities: Take all medications as prescribed Attend all scheduled provider appointments Call pharmacy for medication refills 3-7 days in advance of running out of medications Perform all self care activities independently  Perform IADL's (shopping, preparing meals, housekeeping, managing finances) independently Call provider office for new concerns or questions  Work with the social worker to address care coordination needs and will continue to work with the clinical team to address health care and disease management related needs  Follow Up Plan:  The patient has been provided with contact information for the care management team and has been advised to call with any health related questions or concerns.  The care management team will reach out to the patient again over the next 60business  days.

## 2023-09-06 NOTE — Patient Outreach (Signed)
 Medicaid Managed Care   Nurse Care Manager Note  09/06/2023 Name:  Matthew Lynch MRN:  [id number] DOB:  1971/01/28  Matthew Lynch is an 53 y.o. year old male who is a primary patient of Matthew Bar, MD.  The [facility] Managed Care Coordination team was consulted for assistance with:    Chronic healthcare management needs, DM, neuropathy, tobacco use, chronic low back pain, anxiety/depression, incontinence  [name] was given information about Medicaid Managed Care Coordination team services today. Matthew Lynch Patient agreed to services and verbal consent obtained.  Engaged with patient by telephone for follow up visit in response to provider referral for case management and/or care coordination services.   Patient is participating in a Managed Medicaid Plan:  Yes  Assessments/Interventions:  Review of past medical history, allergies, medications, health status, including review of consultants reports, laboratory and other test data, was performed as part of comprehensive evaluation and provision of chronic care management services.  SDOH (Social Drivers of Health) assessments and interventions performed: SDOH Interventions    Flowsheet Row Patient Outreach Telephone from 09/06/2023 in [place] POPULATION HEALTH DEPARTMENT Patient Outreach Telephone from 07/22/2023 in [place] POPULATION HEALTH DEPARTMENT Patient Outreach Telephone from 06/21/2023 in [place] POPULATION HEALTH DEPARTMENT Care Coordination from 06/09/2023 in [facility] Care Coordination Care Coordination from 03/17/2023 in [facility] Care Coordination Office Visit from 02/18/2023 in [facility] [place]. [facility]  SDOH Interventions        Food Insecurity Interventions -- -- -- Other (Comment)  [receives food stamps limited to cooking locations when staying in car] Other (Comment)  [Receives Foodstamps but has no other income] --   Housing Interventions -- -- -- Other (Comment)  [referral to communityresources] -- --  Transportation Interventions -- -- -- Intervention Not Indicated  [can't afford insurance, gas, etc] --  [Has access to Medicaid transportation] --  Utilities Interventions -- Intervention Not Indicated -- -- Intervention Not Indicated --  Alcohol Usage Interventions -- Intervention Not Indicated (Score <7) -- Intervention Not Indicated (Score <7) -- --  Depression Interventions/Treatment  -- -- -- Medication, Counseling -- Counseling, Medication  Financial Strain Interventions -- -- -- Other (Comment)  [link with resources] -- --  Stress Interventions -- -- -- [employer], Provide Counseling -- --  Health Literacy Interventions Intervention Not Indicated -- Intervention Not Indicated -- -- --     Care Plan No Known Allergies  Medications Reviewed Today     Reviewed by Matthew Veva MATSU, RN (Registered Nurse) on 09/06/23 at 1520  Med List Status: <None>   Medication Order Taking? Sig Documenting Provider Last Dose Status Informant  Continuous Glucose Sensor (DEXCOM G7 SENSOR) MISC [id number]  1 Device by Does not apply route as directed. Apply new sensor every 10 days. Matthew Bar, MD  Active   DULoxetine  (CYMBALTA ) 60 MG capsule [id number]  Take 1 capsule (60 mg total) by mouth 2 (two) times daily. Matthew Bar, MD  Active   feeding supplement, GLUCERNA SHAKE, (GLUCERNA SHAKE) LIQD [id number]  Take 237 mLs by mouth 3 (three) times daily between meals. Matthew Bar, MD  Active   insulin  aspart (FIASP  FLEXTOUCH) 100 UNIT/ML FlexTouch Pen [id number]  Inject 4-7 Units into the skin 3 (three) times daily before meals. Matthew Bar, MD  Active   Insulin  Glargine (BASAGLAR  KWIKPEN) 100 UNIT/ML [id number] No Inject 20 Units into the skin every morning. Matthew Bar, MD Taking Active  Insulin  Pen Needle (BD PEN NEEDLE NANO U/F) 32G X 4 MM MISC [id number]  Use as directed once a day  Matthew Bar, MD  Active   loperamide (IMODIUM) 2 MG capsule [id number] No Take 2 mg by mouth daily as needed for diarrhea or loose stools. [provider] Taking Active            Med Note Matthew Lynch Debar Jun 07, 2023  3:34 PM) Takes ~4 days weekly  rosuvastatin  (CRESTOR ) 10 MG tablet [id number]  Take 1 tablet (10 mg total) by mouth daily. McDiarmid, Matthew BIRCH, MD  Active   triamcinolone  ointment (KENALOG ) 0.5 % [id number] No Apply 1 Application topically 2 (two) times daily. McDiarmid, Matthew BIRCH, MD Taking Active            Patient Active Problem List   Diagnosis Date Noted   Neuropathy due to type 2 diabetes mellitus (HCC) 05/17/2022   Underweight 05/17/2022   Bowel and bladder incontinence 04/26/2022   Abdominal pain 04/23/2022   Depression, recurrent (HCC) 01/29/2020   Hyperglycemia    Hyperlipidemia    Tobacco abuse    Anxiety and depression    Type 2 diabetes mellitus (HCC) 01/24/2020   Chronic bilateral low back pain without sciatica 08/24/2018   Conditions to be addressed/monitored per PCP order:  Chronic healthcare management needs, DM, neuropathy, tobacco use, chronic low back pain, anxiety/depression, incontinence  Care Plan : RN Care Manager Plan of Care  Updates made by Matthew Veva KANDICE, RN since 09/06/2023 12:00 AM     Problem: Health Promotion or Disease Self-Management (General Plan of Care)      Long-Range Goal: Chronic Disease Management and Care Coordination Needs   Start Date: 06/21/2023  Expected End Date: 12/05/2023  Priority: High  Note:   Current Barriers:  Knowledge Deficits related to plan of care for management of DM, neuropathy, HLD, chronic LBP, tobacco use, anxiety/depression, poor appetite, abdominal pain, fecal incontinence Care Coordination needs related to DM, neuropathy, HLD, chronic LBP, tobacco use, anxiety/depression, poor appetite, abdominal pain, fecal incontinence Chronic Disease Management support and education needs related to  DM, neuropathy, HLD, chronic LBP, tobacco use, anxiety/depression, poor appetite, abdominal pain, fecal incontinence Financial Constraints, SDOH-job, housing 09/06/23:  Patient states average blood sugar 230.  Has upcoming GI, PCP, ? URO appt.  Resources received from BSW.  Has Disability appt tomorrow.  Smokes 4 cigarettes a day.  Main issue is abdominal pain/neuropathy pain-has appts scheduled.  RNCM Clinical Goal(s):  verbalize understanding of plan for management of  DM, neuropathy, HLD, chronic LBP, tobacco use, anxiety/depression, poor appetite, abdominal pain, fecal incontinence   as evidenced by patient report verbalize basic understanding of  DM, neuropathy, HLD, chronic LBP, tobacco use, anxiety/depression, poor appetite, abdominal pain, fecal incontinence disease process and self health management plan as evidenced by patient report take all medications exactly as prescribed and will call provider for medication related questions as evidenced by patient report demonstrate understanding of rationale for each prescribed medication as evidenced by patient report attend all scheduled medical appointments as evidenced by patient report continue to work with RN Care Manager to address care management and care coordination needs related to DM, neuropathy, HLD, chronic LBP, tobacco use, anxiety/depression, poor appetite, abdominal pain, fecal incontinence as evidenced by adherence to CM Team Scheduled appointments work with child psychotherapist to address  related to the management of resources, SDOH related to the management of DM, neuropathy, HLD, chronic LBP, tobacco use, anxiety/depression, poor  appetite, abdominal pain, fecal incontinence   as evidenced by review of EMR and patient or social worker report demonstrate ongoing self health care management ability  as evidenced by patient report and EMR review through collaboration with RN Care manager, provider, and care team.   Interventions: Evaluation  of current treatment plan related to  self management and patient's adherence to plan as established by provider 07/22/23:  [facility] sent messages to PCP and BSW to update.  Diabetes Interventions:  (Status:  New goal.) Long Term Goal Assessed patient's understanding of A1c goal: <7% Reviewed medications with patient and discussed importance of medication adherence Counseled on importance of regular laboratory monitoring as prescribed Discussed plans with patient for ongoing care management follow up and provided patient with direct contact information for care management team Reviewed scheduled/upcoming provider appointments Advised patient, providing education and rationale, to check cbg as directed  and record, calling provider  for findings outside established parameters Review of patient status, including review of consultants reports, relevant laboratory and other test results, and medications completed Assessed social determinant of health barriers Lab Results  Component Value Date   HGBA1C 8.9 (A) 06/10/2023   Hyperlipidemia Interventions:  (Status:  New goal.) Long Term Goal Medication review performed; medication list updated in electronic medical record.  Counseled on importance of regular laboratory monitoring as prescribed Reviewed importance of limiting foods high in cholesterol Assessed social determinant of health barriers   Pain Interventions:  (Status:  New goal.) Long Term Goal Pain assessment performed Medications reviewed Discussed importance of adherence to all scheduled medical appointments Reviewed with patient prescribed pharmacological and nonpharmacological pain relief strategies Assessed social determinant of health barriers  Smoking Cessation Interventions:  (Status:  New goal.) Long Term Goal Reviewed smoking history:   currently smoking 4 cigarettes a day Evaluation of current treatment plan reviewed Reviewed scheduled/upcoming provider appointments   Provided contact information for [place] Quit Line (1-800-QUIT-NOW) Discussed plans with patient for ongoing care management follow up and provided patient with direct contact information for care management team Assessed social determinant of health barriers  SDOH Barriers (Status:  ) Long Term Goal Patient interviewed and SDOH assessment performed        SDOH Interventions    Flowsheet Row Patient Outreach Telephone from 06/21/2023 in [place] POPULATION HEALTH DEPARTMENT Care Coordination from 06/09/2023 in [facility] Care Coordination Care Coordination from 03/17/2023 in [facility] Coordination Office Visit from 02/18/2023 in [facility] Office Visit from 09/07/2022 in [facility] Office Visit from 05/17/2022 in [facility]  SDOH Interventions        Food Insecurity Interventions -- Other (Comment)  [receives food stamps limited to cooking locations when staying in car] Other (Comment)  [place] Foodstamps but has no other income] -- -- --  Housing Interventions -- Other (Comment)  [referral to communityresources] -- -- -- --  Transportation Interventions -- Intervention Not Indicated  [can't afford insurance, gas, etc] --  [Has access to Medicaid transportation] -- -- --  Utilities Interventions -- -- Intervention Not Indicated -- -- --  Alcohol Usage Interventions -- Intervention Not Indicated (Score <7) -- -- -- --  Depression Interventions/Treatment  -- Medication, Counseling -- Counseling, Medication Currently on Treatment Counseling  Financial Strain Interventions -- Other (Comment)  [link with resources] -- -- -- --  Stress Interventions -- [employer], Provide Counseling -- -- -- --  Health Literacy Interventions Intervention  Not Indicated -- -- -- -- --      Patient interviewed and appropriate assessments performed Referred patient to SW  for assistance wit resources, SDOH Discussed plans with patient for ongoing care management follow up and provided patient with direct contact information for care management team  Patient Goals/Self-Care Activities: Take all medications as prescribed Attend all scheduled provider appointments Call pharmacy for medication refills 3-7 days in advance of running out of medications Perform all self care activities independently  Perform IADL's (shopping, preparing meals, housekeeping, managing finances) independently Call provider office for new concerns or questions  Work with the social worker to address care coordination needs and will continue to work with the clinical team to address health care and disease management related needs  Follow Up Plan:  The patient has been provided with contact information for the care management team and has been advised to call with any health related questions or concerns.  The care management team will reach out to the patient again over the next 60business  days.   Long-Range Goal: Establish Plan of care for Chronic Disease Management Needs and SDOH Barriers   Priority: High  Note:   Timeframe:  Long-Range Goal Priority:  High Start Date:      06/21/23                       Expected End Date:    ongoing                   Follow Up Date 10/26/23   - practice safe sex - schedule appointment for flu shot - schedule appointment for vaccines needed due to my age or health - schedule recommended health tests - schedule and keep appointment for annual check-up    Why is this important?   Screening tests can find diseases early when they are easier to treat.  Your doctor or nurse will talk with you about which tests are important for you.  Getting shots for common diseases like the flu and shingles will help prevent them.   09/06/23:  patient has GI appt 2/12, PCP 2/25, ? URO appt   Follow Up:  Patient agrees to Care Plan and Follow-up.  Plan: The  Managed Medicaid care management team will reach out to the patient again over the next 60 business  days. and The  Patient has been provided with contact information for the Managed Medicaid care management team and has been advised to call with any health related questions or concerns.  Date/time of next scheduled RN care management/care coordination outreach: 10/26/23 at 1230

## 2023-09-26 ENCOUNTER — Other Ambulatory Visit: Payer: Self-pay

## 2023-09-26 NOTE — Patient Instructions (Signed)
Visit Information  [name] was given information about Medicaid Managed Care team care coordination services as a part of their Healthy [facility] Medicaid benefit. Matthew Lynch verbally consented to engagement with the [facility] Managed Care team.   If you are experiencing a medical emergency, please call 911 or report to your local emergency department or urgent care.   If you have a non-emergency medical problem during routine business hours, please contact your provider's office and ask to speak with a nurse.   For questions related to your Healthy [facility] health plan, please call: [phone] or visit the homepage here: [email or web address]  If you would like to schedule transportation through your Healthy [facility] plan, please call the following number at least 2 days in advance of your appointment: [phone]  For information about your ride after you set it up, call Ride Assist at [phone]. Use this number to activate a Will Call pickup, or if your transportation is late for a scheduled pickup. Use this number, too, if you need to make a change or cancel a previously scheduled reservation.  If you need transportation services right away, call [phone]. The after-hours call center is staffed 24 hours to handle ride assistance and urgent reservation requests (including discharges) 365 days a year. Urgent trips include sick visits, hospital discharge requests and life-sustaining treatment.  Call the [facility] Line at [phone], at any time, 24 hours a day, 7 days a week. If you are in danger or need immediate medical attention call 911.  If you would like help to quit smoking, call 1-800-QUIT-NOW (980-172-7147) OR Espaol: 1-855-Djelo-Ya (4-742-595-6387) o para ms informacin haga clic aqu or Text READY to 564-332 to register via text  [name] - following are the goals we discussed in your visit today:    Goals Addressed   None     Social Worker will follow up in 30 days.   Matthew Lynch, Matthew Lynch, MHA [facility]  Managed Medicaid Social Worker [phone]   Following is a copy of your plan of care:  There are no care plans that you recently modified to display for this patient.

## 2023-09-26 NOTE — Patient Outreach (Signed)
Medicaid Managed Care Social Work Note  09/26/2023 Name:  Matthew Lynch MRN:  [id number] DOB:  [birth year]  Matthew Lynch is an 53 y.o. year old male who is a primary patient of Matthew Mola, MD.  The [facility] Managed Care Coordination team was consulted for assistance with:   housing  Matthew Lynch was given information about Medicaid Managed Care Coordination team services today. Matthew Lynch Patient agreed to services and verbal consent obtained.  Engaged with patient  for by telephone forfollow up visit in response to referral for case management and/or care coordination services.   Patient is participating in a Managed Medicaid Plan:  Yes  Assessments/Interventions:  Review of past medical history, allergies, medications, health status, including review of consultants reports, laboratory and other test data, was performed as part of comprehensive evaluation and provision of chronic care management services.  SDOH: (Social Drivers of Health) assessments and interventions performed: SDOH Interventions    Flowsheet Row Patient Outreach Telephone from 09/06/2023 in [place] [facility] POPULATION HEALTH DEPARTMENT Patient Outreach Telephone from 07/22/2023 in [place] POPULATION HEALTH DEPARTMENT Patient Outreach Telephone from 06/21/2023 in [place] POPULATION HEALTH DEPARTMENT Care Coordination from 06/09/2023 in [facility] Care Coordination Care Coordination from 03/17/2023 in [facility] Care Coordination Office Visit from 02/18/2023 in [facility] [place]. [facility]  SDOH Interventions        Food Insecurity Interventions -- -- -- Other (Comment)  [receives food stamps limited to cooking locations when staying in car] Other (Comment)  [Receives Foodstamps but has no other income] --  Housing Interventions -- -- -- Other (Comment)  [referral to communityresources] -- --  Transportation Interventions -- --  -- Intervention Not Indicated  [can't afford insurance, gas, etc] --  [Has access to Medicaid transportation] --  Utilities Interventions -- Intervention Not Indicated -- -- Intervention Not Indicated --  Alcohol Usage Interventions -- Intervention Not Indicated (Score <7) -- Intervention Not Indicated (Score <7) -- --  Depression Interventions/Treatment  -- -- -- Medication, Counseling -- Counseling, Medication  Financial Strain Interventions -- -- -- Other (Comment)  [link with resources] -- --  Stress Interventions -- -- -- [employer], Provide Counseling -- --  Health Literacy Interventions Intervention Not Indicated -- Intervention Not Indicated -- -- --      BSW completed a telephone outreach with patient, he states he is currently living with his cousin. He did have his appointment with disability and is waiting on a decision. Patient states he would now like housing options for something stable. BSW and patient agreed for resources to be mailed to address on file.  Advanced Directives Status:  Not addressed in this encounter.  Care Plan                 No Known Allergies  Medications Reviewed Today   Medications were not reviewed in this encounter     Patient Active Problem List   Diagnosis Date Noted   Neuropathy due to type 2 diabetes mellitus (HCC) 05/17/2022   Underweight 05/17/2022   Bowel and bladder incontinence 04/26/2022   Abdominal pain 04/23/2022   Depression, recurrent (HCC) 01/29/2020   Hyperglycemia    Hyperlipidemia    Tobacco abuse    Anxiety and depression    Type 2 diabetes mellitus (HCC) 01/24/2020   Chronic bilateral low back pain without sciatica 08/24/2018    Conditions to be addressed/monitored per PCP order:  housing  There are no care plans that you recently modified to display for this patient.   Follow up:  Patient agrees to Care Plan and Follow-up.  Plan: The Managed Medicaid care management team will reach out  to the patient again over the next 30 days.  Date/time of next scheduled Social Work care management/care coordination outreach:  10/27/23 Matthew Lynch, Matthew Lynch, [facility]  Managed [facility] Social Worker [phone]

## 2023-10-12 ENCOUNTER — Ambulatory Visit (INDEPENDENT_AMBULATORY_CARE_PROVIDER_SITE_OTHER): Payer: 59 | Admitting: Physician Assistant

## 2023-10-12 ENCOUNTER — Encounter: Payer: Self-pay | Admitting: Physician Assistant

## 2023-10-12 VITALS — BP 118/70 | HR 78 | Ht 65.0 in | Wt 112.0 lb

## 2023-10-12 DIAGNOSIS — R197 Diarrhea, unspecified: Secondary | ICD-10-CM

## 2023-10-12 DIAGNOSIS — Z794 Long term (current) use of insulin: Secondary | ICD-10-CM | POA: Diagnosis not present

## 2023-10-12 DIAGNOSIS — K529 Noninfective gastroenteritis and colitis, unspecified: Secondary | ICD-10-CM

## 2023-10-12 DIAGNOSIS — E118 Type 2 diabetes mellitus with unspecified complications: Secondary | ICD-10-CM | POA: Diagnosis not present

## 2023-10-12 DIAGNOSIS — R109 Unspecified abdominal pain: Secondary | ICD-10-CM

## 2023-10-12 DIAGNOSIS — E1140 Type 2 diabetes mellitus with diabetic neuropathy, unspecified: Secondary | ICD-10-CM

## 2023-10-12 MED ORDER — PEG 3350-KCL-NA BICARB-NACL 420 G PO SOLR: ORAL | 0 refills | Status: AC

## 2023-10-12 MED ORDER — METRONIDAZOLE 250 MG PO TABS: 250.0000 mg | ORAL_TABLET | Freq: Three times a day (TID) | ORAL | 0 refills | Status: AC

## 2023-10-12 NOTE — Progress Notes (Signed)
10/12/2023 Matthew Lynch [id number] [birth year]  Referring provider: [name], MD Primary GI doctor: [name]  ASSESSMENT AND PLAN:     Chronic Diarrhea Had normal EGD/colon 09/2022 Negative celiac, normal TI History of chronic diarrhea with incontinence, predominantly at night. Previous workup including CT scan and colonoscopy unremarkable except for diverticulosis. Noted improvement with prior course of Metronidazole. Rectal exam revealed significant amount of hard stool, suggesting possible overflow incontinence. -Order bowel prep to clear out hard stool with enemas. -Prescribe Metronidazole as it previously improved symptoms. -After bowel prep, start daily Miralax and fiber to regulate bowel movements. -Consider further studies for possible neuropathy affecting rectal tone if symptoms persist after bowel cleanout.  Diabetes Patient reports symptoms suggestive of diabetic neuropathy including "cloudy brain fog" and "balance type of thing". Also, possible contribution to bowel symptoms. -Continue management with primary care doctor and neurologist. -Encourage continued efforts to improve A1C. -Possible Carnett's sign/neuropathy no rash continue Cymbalta and follow-up with endocrinologist  Follow-up in 4-6 weeks to assess response to treatment. Patient advised to call with any issues or concerns in the interim.    Patient Care Team: [name], MD as PCP - General (Family Medicine) [name], [name], RN as Case Manager  HISTORY OF PRESENT ILLNESS: 53 y.o. male with a past medical history of anxiety, depression, poorly controlled type 2 diabetes insulin-dependent, current smoker, single father of 6, presents for follow-up.  Last seen 07/14/2022 by [name] At last office visit patient reported diarrhea with episodes of incontinence, incontinent episodes particularly at nighttime.    Had been evaluated in the past for abdominal pain, nausea, diarrhea with  normal CT 01/16/2020.  Repeat CT June 2023 (care everywhere) was also unremarkable stated he had increased colonic stool.  [name] recommended GI pathogen panel and C. difficile stool studies which were negative and then recommended Imodium as needed for diarrhea and put him on a course of metronidazole.  Discussed the use of AI scribe software for clinical note transcription with the patient, who gave verbal consent to proceed.  History of Present Illness   The patient, with a history of diabetes and neuropathy, presents with chronic diarrhea and abdominal pain. The diarrhea is described as watery and often occurs at night, leading to incontinence. The patient reports having to use the bathroom six to eight times a night, which he attributes to his blood sugar levels. The patient also mentions experiencing neuropathy pain in the abdominal area and down the legs.  The patient has seen multiple specialists for these issues and has undergone various tests, including a CT scan and colonoscopy. The results of these tests were largely normal, with the exception of diverticulosis found during the colonoscopy. The patient was previously prescribed metronidazole, an antibiotic, which he reports improved his symptoms. However, he was unable to complete the course of antibiotics due to an arrest and subsequent jail time. Upon release, the patient was prescribed another course of antibiotics by his primary care doctor, which again improved his symptoms. However, the symptoms returned after the completion of the antibiotics.  The patient also mentions a history of spinal cord issues and is currently not a candidate for surgery due to his A1c levels. He expresses frustration with his symptoms and their impact on his quality of life.       He  reports that he has been smoking cigarettes. He has been exposed to tobacco smoke. He uses smokeless tobacco. He reports that he does not drink  alcohol and does not use  drugs.  RELEVANT GI HISTORY, LABS, IMAGING: EGD 10/06/2022 for diarrhea and weight loss - Normal stomach - Normal esophagus - Normal duodenum, biopsies negative for celiac  Colonoscopy 10/06/2022 for screening, diarrhea, weight loss - The examined portion of the ileum was normal.  - Diverticulosis in the sigmoid colon.  - The examination was otherwise normal.  - biopsies negative for microscopic colitis - Repeat 10 years  Recent labs 06/14/2023 A1c 8.9 BMP normal other than glucose of 141 CBC normal TSH 1.620 LFTs normal  CBC    Component Value Date/Time   WBC 8.6 06/14/2023 1227   WBC 8.7 03/24/2020 1640   RBC 5.23 06/14/2023 1227   RBC 5.13 03/24/2020 1640   HGB 15.9 06/14/2023 1227   HCT 47.7 06/14/2023 1227   PLT 378 06/14/2023 1227   MCV 91 06/14/2023 1227   MCH 30.4 06/14/2023 1227   MCH 29.6 03/24/2020 1640   MCHC 33.3 06/14/2023 1227   MCHC 33.7 03/24/2020 1640   RDW 12.3 06/14/2023 1227   LYMPHSABS 2.9 06/14/2023 1227   MONOABS 0.9 01/24/2020 2349   EOSABS 0.6 (H) 06/14/2023 1227   BASOSABS 0.1 06/14/2023 1227   Recent Labs    06/14/23 1227  HGB 15.9    CMP     Component Value Date/Time   NA 137 06/14/2023 1227   K 4.2 06/14/2023 1227   CL 98 06/14/2023 1227   CO2 23 06/14/2023 1227   GLUCOSE 241 (H) 06/14/2023 1227   GLUCOSE 611 (HH) 03/24/2020 1640   BUN 8 06/14/2023 1227   CREATININE 0.85 06/14/2023 1227   CALCIUM 10.0 06/14/2023 1227   PROT 7.4 06/14/2023 1227   ALBUMIN 4.7 06/14/2023 1227   AST 23 06/14/2023 1227   ALT 31 06/14/2023 1227   ALKPHOS 79 06/14/2023 1227   BILITOT 0.7 06/14/2023 1227   GFRNONAA 101 08/07/2020 1529   GFRAA 117 08/07/2020 1529      Latest Ref Rng & Units 06/14/2023   12:27 PM 04/23/2022    4:23 PM 05/13/2020    1:56 PM  Hepatic Function  Total Protein 6.0 - 8.5 g/dL 7.4  7.3  8.0   Albumin 4.1 - 5.1 g/dL 4.7  4.7  5.0   AST 0 - 40 IU/L 23  29  35   ALT 0 - 44 IU/L 31  96  67   Alk Phosphatase 44 - 121  IU/L 79  129  101   Total Bilirubin 0.0 - 1.2 mg/dL 0.7  0.5  0.5       Current Medications:   Current Outpatient Medications (Endocrine & Metabolic):    insulin aspart (FIASP FLEXTOUCH) 100 UNIT/ML FlexTouch Pen, Inject 4-7 Units into the skin 3 (three) times daily before meals.   Insulin Glargine (BASAGLAR KWIKPEN) 100 UNIT/ML, Inject 20 Units into the skin every morning.  Current Outpatient Medications (Cardiovascular):    rosuvastatin (CRESTOR) 10 MG tablet, Take 1 tablet (10 mg total) by mouth daily.     Current Outpatient Medications (Other):    Continuous Glucose Sensor (DEXCOM G7 SENSOR) MISC, 1 Device by Does not apply route as directed. Apply new sensor every 10 days.   DULoxetine (CYMBALTA) 60 MG capsule, Take 1 capsule (60 mg total) by mouth 2 (two) times daily.   feeding supplement, GLUCERNA SHAKE, (GLUCERNA SHAKE) LIQD, Take 237 mLs by mouth 3 (three) times daily between meals.   Insulin Pen Needle (BD PEN NEEDLE NANO U/F) 32G X 4 MM  MISC, Use as directed once a day   loperamide (IMODIUM) 2 MG capsule, Take 2 mg by mouth daily as needed for diarrhea or loose stools.   metroNIDAZOLE (FLAGYL) 250 MG tablet, Take 1 tablet (250 mg total) by mouth 3 (three) times daily for 10 days.   polyethylene glycol-electrolytes (NULYTELY) 420 g solution, do 6 oz every 30 mins until the impaction passes, can do 1/2 gallon on first day and 1/2 gallon on 2nd day.  Can continue 2 fleets enemas a day until it resolves.   triamcinolone ointment (KENALOG) 0.5 %, Apply 1 Application topically 2 (two) times daily.  Medical History:  Past Medical History:  Diagnosis Date   Anxiety    Depression    Diabetes mellitus without complication (HCC)    Allergies: No Known Allergies   Surgical History:  He  has no past surgical history on file. Family History:  His family history includes Colon cancer in his maternal grandfather; Diabetes in his maternal great-grandmother and sister.  REVIEW OF  SYSTEMS  : All other systems reviewed and negative except where noted in the History of Present Illness.  PHYSICAL EXAM: BP 118/70   Pulse 78   Ht 5\' 5"  (1.651 m)   Wt 112 lb (50.8 kg)   BMI 18.64 kg/m  General Appearance: Well nourished, in no apparent distress. Head:   Normocephalic and atraumatic. Eyes:  sclerae anicteric,conjunctive pink  Respiratory: Respiratory effort normal, BS equal bilaterally without rales, rhonchi, wheezing. Cardio: RRR with no MRGs. Peripheral pulses intact.  Abdomen: Soft,  Flat , hypoactive bowel sounds, diffuse abdominal tenderness upper and lower abdomen some even mild palpation, positive Carnett's sign.  Rectal: Unremarkable external rectal exam, decreased to normal rectal tone, cavernous rectum with hard brown stool no masses normal prostate.  Nontender Musculoskeletal: Full ROM, Normal gait. Without edema. Skin:  Dry and intact without significant lesions or rashes Neuro: Alert and  oriented x4;  No focal deficits. Psych:  Cooperative. Normal mood and affect.       Doree Albee, PA-C 4:40 PM

## 2023-10-12 NOTE — Patient Instructions (Signed)
Take the flagyl as prescribed  Please send in Trilyte- take as directed- but do 6 oz every 30 mins until the impaction passes, can do 1/2 gallon on first day and 1/2 gallon on 2nd day.  Can continue 2 fleets enemas a day until it resolves.  Go to ER if any severe AB pain, vomiting/unable to hold down food/drink, blood in stool, or unable to pass gas.  I believe your loose stools may be due to something called overflow diarrhea. If you predominantly have constipation, straining or incomplete bowel movements at times the only thing to get through the large amounts of stool is liquid. This can cause someone to feel like they are having diarrhea yet actually they have too much backup of stool. We can evaluate this with a rectal exam, and x-ray of your abdomen, or CT scan.  AFTER YOU TO THE PREP ABOVE START ON THIS BELOW!  If this is the case usually doing a combination of Benefiber 1-2 times daily with MiraLAX 17 g daily can be helpful. At times there are medications that can also be helpful for this but generally I start with over-the-counter I also like somebody to get a squatty potty to help straighten out the colon whether having a bowel movement. Sometimes we will schedule for pelvic floor physical therapy if you are having incomplete bowel movements and failure pelvic floor is weak and contributing to the constipation.   Benefiber or Citracel is good for constipation/diarrhea/irritable bowel syndrome, it helps with weight loss and can help lower your bad cholesterol. Please do 1 TBSP in the morning in water, coffee, or tea up to twice a day. It can take up to a month before you can see a difference with your bowel movements. It is cheapest from [employer], [employer], [employer].   Miralax is an osmotic laxative.  It only brings more water into the stool.  This is safe to take daily.  Can take up to 17 gram of miralax twice a day.  Mix with juice or coffee.  Start 1 capful at night for 3-4 days and  reassess your response in 3-4 days.  You can increase and decrease the dose based on your response.  Remember, it can take up to 3-4 days to take effect OR for the effects to wear off.   I often pair this with benefiber in the morning to help assure the stool is not too loose.

## 2023-10-13 NOTE — Progress Notes (Signed)
Agree with assessment and plans

## 2023-10-25 ENCOUNTER — Ambulatory Visit (INDEPENDENT_AMBULATORY_CARE_PROVIDER_SITE_OTHER): Payer: 59 | Admitting: Family Medicine

## 2023-10-25 ENCOUNTER — Encounter: Payer: Self-pay | Admitting: Family Medicine

## 2023-10-25 ENCOUNTER — Ambulatory Visit: Payer: Self-pay | Admitting: Family Medicine

## 2023-10-25 DIAGNOSIS — E1165 Type 2 diabetes mellitus with hyperglycemia: Secondary | ICD-10-CM

## 2023-10-25 DIAGNOSIS — E1140 Type 2 diabetes mellitus with diabetic neuropathy, unspecified: Secondary | ICD-10-CM

## 2023-10-25 DIAGNOSIS — Z794 Long term (current) use of insulin: Secondary | ICD-10-CM

## 2023-10-25 LAB — POCT GLYCOSYLATED HEMOGLOBIN (HGB A1C): HbA1c, POC (controlled diabetic range): 9.2 % — AB (ref 0.0–7.0)

## 2023-10-25 MED ORDER — BASAGLAR KWIKPEN 100 UNIT/ML ~~LOC~~ SOPN: 22.0000 [IU] | PEN_INJECTOR | SUBCUTANEOUS | Status: DC

## 2023-10-25 NOTE — Assessment & Plan Note (Deleted)
-  A1c -UACR

## 2023-10-25 NOTE — Assessment & Plan Note (Addendum)
-  A1c 9.2 today from 8.9 at last visit -Increase Basaglar to 22 units daily.  Patient has CGM, discussed precautions with hypoglycemia -Continue current dose of mealtime insulin (patient reports 6-8 units 3 times daily).  Discussed precautions and advised that patient ensure that he eats every time he takes this to avoid hypoglycemia -Continue Cymbalta 60 mg daily -Scheduled follow-up with Dr. Raymondo Band as he has been seen by him in the past, especially since he has a CGM and may have had brief episodes of hypoglycemia. -PCP follow-up in about 4 weeks -Provided information regarding cholesterol content and carbohydrate content in foods per patient request -UACR at next visit

## 2023-10-25 NOTE — Progress Notes (Signed)
    SUBJECTIVE:   CHIEF COMPLAINT / HPI:   T2DM with neuropathy -Current medication regimen: 20 units insulin glargine daily, 4-7 units insulin aspart 3 times daily before meals.  On Cymbalta for neuropathy which helps more than gabapentin she is. Still having numbness/tingling/pain in legs and hands, worse in the cold weather   -Home CBGs: 200s-215.  Has a CGM.  Does have a few episodes of dipping into the 70s during the day, he states that this is usually when he takes his mealtime insulin but does not eat immediately after.  This corrects after eating. -Does endorse "brain fog" and blurry vision ongoing for a while, nothing new -Foot exam: done today  -Eye exam: appt in june   Lab Results  Component Value Date   HGBA1C 9.2 (A) 10/25/2023   HGBA1C 8.9 (A) 06/10/2023   HGBA1C 8.1 (A) 02/18/2023      PERTINENT  PMH / PSH: T2DM, diabetic neuropathy  OBJECTIVE:   BP 115/65   Pulse (!) 110   Ht 5\' 5"  (1.651 m)   Wt 117 lb (53.1 kg)   SpO2 100%   BMI 19.47 kg/m   General: NAD, pleasant, able to participate in exam Respiratory: No respiratory distress Psych: Normal affect and mood  Feet: Bilateral feet without significant skin breakdown, ulcers, wounds.  He does have some callus formation laterally without any overlying skin breakdown or wounds.  Sensation intact to sharp and dull touch above and below the ankle joint as well as throughout his feet and toes.  Proprioception intact.  ASSESSMENT/PLAN:   Assessment & Plan Type 2 diabetes mellitus with diabetic neuropathy, unspecified whether long term insulin use (HCC) -A1c 9.2 today from 8.9 at last visit -Increase Basaglar to 22 units daily.  Patient has CGM, discussed precautions with hypoglycemia -Continue current dose of mealtime insulin (patient reports 6-8 units 3 times daily).  Discussed precautions and advised that patient ensure that he eats every time he takes this to avoid hypoglycemia -Continue Cymbalta 60 mg  daily -Scheduled follow-up with Dr. Raymondo Lynch as he has been seen by him in the past, especially since he has a CGM and may have had brief episodes of hypoglycemia. -PCP follow-up in about 4 weeks -Provided information regarding cholesterol content and carbohydrate content in foods per patient request -UACR at next visit   Answered with a score of 1 on #9 on PHQ-9.  However, denies suicidal ideation or plan, denies HI.  States that his illness has been weighing down on him recently and sometimes makes him feel like he would be better off dead, but denies any actual thoughts of harming himself.  Do not feel that the patient is an active threat to himself or others.  Matthew Drafts, MD [facility]

## 2023-10-25 NOTE — Patient Instructions (Addendum)
 Please take 22 units of basaglar daily  Remember to eat after taking your mealtime insulin  Continue taking cymbalta  See Dr. Raymondo Band next Tuesday 3/4 at 2:30 PM

## 2023-10-26 ENCOUNTER — Other Ambulatory Visit: Payer: Self-pay | Admitting: Obstetrics and Gynecology

## 2023-10-26 NOTE — Patient Outreach (Signed)
 Medicaid Managed Care   Nurse Care Manager Note  10/26/2023 Name:  Davonte Siebenaler MRN:  [id number] DOB:  May 15, 1971  Curren Mohrmann is an 53 y.o. year old male who is a primary patient of [name], MD.  [facility] Managed Care Coordination team was consulted for assistance with:    Chronic healthcare management needs, DM, neuropathy, chronic LBP, anxiety/depression, tobacco use  Mr. Coryell was given information about Medicaid Managed Care Coordination team services today. [name] Patient agreed to services and verbal consent obtained.  Engaged with patient by telephone for follow up visit in response to provider referral for case management and/or care coordination services.   Patient is participating in a Managed Medicaid Plan:  Yes  Assessments/Interventions:  Review of past medical history, allergies, medications, health status, including review of consultants reports, laboratory and other test data, was performed as part of comprehensive evaluation and provision of chronic care management services.  SDOH (Social Drivers of Health) assessments and interventions performed: SDOH Interventions    Flowsheet Row Patient Outreach Telephone from 10/26/2023 in [place] POPULATION HEALTH DEPARTMENT Patient Outreach Telephone from 09/06/2023 in [place] POPULATION HEALTH DEPARTMENT Patient Outreach Telephone from 07/22/2023 in [place] POPULATION HEALTH DEPARTMENT Patient Outreach Telephone from 06/21/2023 in [place] POPULATION HEALTH DEPARTMENT Care Coordination from 06/09/2023 in [facility] Care Coordination Care Coordination from 03/17/2023 in [facility] Care Coordination  SDOH Interventions        Food Insecurity Interventions -- -- -- -- Other (Comment)  [receives food stamps limited to cooking locations when staying in car] Other (Comment)  [Receives Foodstamps but has no other income]  Housing Interventions -- -- -- --  Other (Comment)  [referral to communityresources] --  Transportation Interventions -- -- -- -- Intervention Not Indicated  [can't afford insurance, gas, etc] --  [Has access to Medicaid transportation]  Utilities Interventions Intervention Not Indicated -- Intervention Not Indicated -- -- Intervention Not Indicated  Alcohol Usage Interventions Intervention Not Indicated (Score <7) -- Intervention Not Indicated (Score <7) -- Intervention Not Indicated (Score <7) --  Depression Interventions/Treatment  -- -- -- -- Medication, Counseling --  Financial Strain Interventions -- -- -- -- Other (Comment)  [link with resources] --  Stress Interventions -- -- -- -- [employer], Provide Counseling --  Health Literacy Interventions -- Intervention Not Indicated -- Intervention Not Indicated -- --     Care Plan No Known Allergies  Medications Reviewed Today     Reviewed by Danie Chandler, RN (Registered Nurse) on 10/26/23 at 1247  Med List Status: <None>   Medication Order Taking? Sig Documenting Provider Last Dose Status Informant  Continuous Glucose Sensor (DEXCOM G7 SENSOR) MISC [id number] No 1 Device by Does not apply route as directed. Apply new sensor every 10 days. [name], MD Taking Active   DULoxetine (CYMBALTA) 60 MG capsule [id number] No Take 1 capsule (60 mg total) by mouth 2 (two) times daily. [name], MD Taking Active   feeding supplement, GLUCERNA SHAKE, (GLUCERNA SHAKE) LIQD [id number] No Take 237 mLs by mouth 3 (three) times daily between meals. [name], MD Taking Active   insulin aspart (FIASP FLEXTOUCH) 100 UNIT/ML FlexTouch Pen [id number] No Inject 4-7 Units into the skin 3 (three) times daily before meals. [name], MD Taking Active   Insulin Glargine [facility] KWIKPEN) 100 UNIT/ML [id number]  Inject 22 Units into the skin every morning. Vonna Drafts, MD  Active   Insulin Pen Needle (BD  PEN NEEDLE NANO U/F) 32G X 4 MM MISC [id number] No  Use as directed once a day [name], MD Taking Active   loperamide (IMODIUM) 2 MG capsule [id number] No Take 2 mg by mouth daily as needed for diarrhea or loose stools. [provider] Taking Active            Med Note Raymondo Band, Joaquim Lai Jun 07, 2023  3:34 PM) Takes ~4 days weekly  polyethylene glycol-electrolytes (NULYTELY) 420 g solution [id number]  do 6 oz every 30 mins until the impaction passes, can do 1/2 gallon on first day and 1/2 gallon on 2nd day.  Can continue 2 fleets enemas a day until it resolves. Doree Albee, PA-C  Active   rosuvastatin (CRESTOR) 10 MG tablet [id number] No Take 1 tablet (10 mg total) by mouth daily. McDiarmid, Leighton Roach, MD Taking Active   triamcinolone ointment (KENALOG) 0.5 % [id number] No Apply 1 Application topically 2 (two) times daily. McDiarmid, Leighton Roach, MD Taking Active            Patient Active Problem List   Diagnosis Date Noted   Neuropathy due to type 2 diabetes mellitus (HCC) 05/17/2022   Underweight 05/17/2022   Bowel and bladder incontinence 04/26/2022   Abdominal pain 04/23/2022   Depression, recurrent (HCC) 01/29/2020   Hyperglycemia    Hyperlipidemia    Tobacco abuse    Anxiety and depression    Type 2 diabetes mellitus (HCC) 01/24/2020   Chronic bilateral low back pain without sciatica 08/24/2018   Conditions to be addressed/monitored per PCP order:  Chronic healthcare management needs, DM, neuropathy, chronic LBP, anxiety/depression, tobacco use  Care Plan : RN Care Manager Plan of Care  Updates made by Danie Chandler, RN since 10/26/2023 12:00 AM     Problem: Health Promotion or Disease Self-Management (General Plan of Care)      Long-Range Goal: Chronic Disease Management and Care Coordination Needs   Start Date: 06/21/2023  Expected End Date: 01/23/2024  Priority: High  Note:   Current Barriers:  Knowledge Deficits related to plan of care for management of DM, neuropathy, HLD, chronic LBP, tobacco use,  anxiety/depression, poor appetite, abdominal pain, fecal incontinence Care Coordination needs related to DM, neuropathy, HLD, chronic LBP, tobacco use, anxiety/depression, poor appetite, abdominal pain, fecal incontinence Chronic Disease Management support and education needs related to DM, neuropathy, HLD, chronic LBP, tobacco use, anxiety/depression, poor appetite, abdominal pain, fecal incontinence Financial Constraints, SDOH-job, housing 10/26/23: patient with anxiety/depression, feelings of helplessness and hopelessness.  Unstable housing situation.  Has BSW appt this week.  Did agree to LCSW appt and scheduled.  Recently seen by PCP and GI.  Insulin dose adjusted.  Average blood sugar 199.  Feels like GI symptoms are improved.  Smokes 6 cigarettes a day.  Waiting to hear on disability determination.    RNCM Clinical Goal(s):  verbalize understanding of plan for management of  DM, neuropathy, HLD, chronic LBP, tobacco use, anxiety/depression, poor appetite, abdominal pain, fecal incontinence   as evidenced by patient report verbalize basic understanding of  DM, neuropathy, HLD, chronic LBP, tobacco use, anxiety/depression, poor appetite, abdominal pain, fecal incontinence disease process and self health management plan as evidenced by patient report take all medications exactly as prescribed and will call provider for medication related questions as evidenced by patient report demonstrate understanding of rationale for each prescribed medication as evidenced by patient report attend all scheduled medical appointments as evidenced by patient report continue  to work with RN Care Manager to address care management and care coordination needs related to DM, neuropathy, HLD, chronic LBP, tobacco use, anxiety/depression, poor appetite, abdominal pain, fecal incontinence as evidenced by adherence to CM Team Scheduled appointments work with Child psychotherapist to address  related to the management of resources,  SDOH related to the management of DM, neuropathy, HLD, chronic LBP, tobacco use, anxiety/depression, poor appetite, abdominal pain, fecal incontinence   as evidenced by review of EMR and patient or social worker report demonstrate ongoing self health care management ability  as evidenced by patient report and EMR review through collaboration with Medical illustrator, provider, and care team.   Interventions: Evaluation of current treatment plan related to  self management and patient's adherence to plan as established by provider 07/22/23:  [facility] sent messages to PCP and BSW to update. 10/26/23: Collaborated with BSW and LCSW.  LCSW appt scheduled due to anxiety/depression and feelings of helplessness and hopelessness.  Diabetes Interventions:  (Status:  New goal.) Long Term Goal Assessed patient's understanding of A1c goal: <7% Reviewed medications with patient and discussed importance of medication adherence Counseled on importance of regular laboratory monitoring as prescribed Discussed plans with patient for ongoing care management follow up and provided patient with direct contact information for care management team Reviewed scheduled/upcoming provider appointments Advised patient, providing education and rationale, to check cbg as directed  and record, calling provider  for findings outside established parameters Review of patient status, including review of consultants reports, relevant laboratory and other test results, and medications completed Assessed social determinant of health barriers Lab Results  Component Value Date   HGBA1C 9.2 10/25/23   Hyperlipidemia Interventions:  (Status:  New goal.) Long Term Goal Medication review performed; medication list updated in electronic medical record.  Counseled on importance of regular laboratory monitoring as prescribed Reviewed importance of limiting foods high in cholesterol Assessed social determinant of health barriers   Pain  Interventions:  (Status:  New goal.) Long Term Goal Pain assessment performed Medications reviewed Discussed importance of adherence to all scheduled medical appointments Reviewed with patient prescribed pharmacological and nonpharmacological pain relief strategies Assessed social determinant of health barriers  Smoking Cessation Interventions:  (Status:  New goal.) Long Term Goal Reviewed smoking history:   currently smoking 6 cigarettes a day Evaluation of current treatment plan reviewed Reviewed scheduled/upcoming provider appointments  Provided contact information for [place] [facility]uit [facility] (1-800-QUIT-NOW) Discussed plans with patient for ongoing care management follow up and provided patient with direct contact information for care management team Assessed social determinant of health barriers  SDOH Barriers (Status:  ) Long Term Goal Patient interviewed and SDOH assessment performed        SDOH Interventions    Flowsheet Row Patient Outreach Telephone from 06/21/2023 in [place] POPULATION HEALTH DEPARTMENT Care Coordination from 06/09/2023 in [facility] Care Coordination Care Coordination from 03/17/2023 in [facility] Care Coordination Office Visit from 02/18/2023 in [facility] Office Visit from 09/07/2022 in [facility] Office Visit from 05/17/2022 in [facility]  SDOH Interventions        Food Insecurity Interventions -- Other (Comment)  [receives food stamps limited to cooking locations when staying in car] Other (Comment)  Dolores Lory Foodstamps but has no other income] -- -- --  Housing Interventions -- Other (Comment)  [referral to communityresources] -- -- -- --  Transportation Interventions -- Intervention Not Indicated  [can't afford insurance,  gas, etc] --  [Has access to Medicaid transportation] -- -- --  Utilities Interventions -- -- Intervention Not Indicated -- -- --   Alcohol Usage Interventions -- Intervention Not Indicated (Score <7) -- -- -- --  Depression Interventions/Treatment  -- Medication, Counseling -- Counseling, Medication Currently on Treatment Counseling  Financial Strain Interventions -- Other (Comment)  [link with resources] -- -- -- --  Stress Interventions -- [employer], Provide Counseling -- -- -- --  Health Literacy Interventions Intervention Not Indicated -- -- -- -- --      Patient interviewed and appropriate assessments performed Referred patient to SW for assistance wit resources, SDOH Discussed plans with patient for ongoing care management follow up and provided patient with direct contact information for care management team  Patient Goals/Self-Care Activities: Take all medications as prescribed Attend all scheduled provider appointments Call pharmacy for medication refills 3-7 days in advance of running out of medications Perform all self care activities independently  Perform IADL's (shopping, preparing meals, housekeeping, managing finances) independently Call provider office for new concerns or questions  Work with the social worker to address care coordination needs and will continue to work with the clinical team to address health care and disease management related needs  Follow Up Plan:  The patient has been provided with contact information for the care management team and has been advised to call with any health related questions or concerns.  The care management team will reach out to the patient again over the next 30 business  days.   Long-Range Goal: Establish Plan of care for Chronic Disease Management Needs and SDOH Barriers   Priority: High  Note:   Timeframe:  Long-Range Goal Priority:  High Start Date:      06/21/23                       Expected End Date:    ongoing                   Follow Up Date 11/23/23   - practice safe sex - schedule appointment for flu shot -  schedule appointment for vaccines needed due to my age or health - schedule recommended health tests - schedule and keep appointment for annual check-up    Why is this important?   Screening tests can find diseases early when they are easier to treat.  Your doctor or nurse will talk with you about which tests are important for you.  Getting shots for common diseases like the flu and shingles will help prevent them.   10/26/23: recently seen by GI and PCP, has BSW appt this week and LCSW appt scheduled.   Follow Up:  Patient agrees to Care Plan and Follow-up.  Plan: The Managed Medicaid care management team will reach out to the patient again over the next 30 business  days. and The  Patient has been provided with contact information for the Managed Medicaid care management team and has been advised to call with any health related questions or concerns.  Date/time of next scheduled RN care management/care coordination outreach:  11/23/23 at 315.

## 2023-10-26 NOTE — Patient Instructions (Signed)
 Visit Information  [name] was given information about Medicaid Managed Care team care coordination services as a part of their Healthy [place] Medicaid benefit. Matthew Lynch verbally consented to engagement with the [facility] Managed Care team.   If you are experiencing a medical emergency, please call 911 or report to your local emergency department or urgent care.   If you have a non-emergency medical problem during routine business hours, please contact your provider's office and ask to speak with a nurse.   For questions related to your Healthy [facility] health plan, please call: [phone] or visit the homepage here: [email or web address]  If you would like to schedule transportation through your Healthy [facility] plan, please call the following number at least 2 days in advance of your appointment: [phone]  For information about your ride after you set it up, call Ride Assist at [phone]. Use this number to activate a Will Call pickup, or if your transportation is late for a scheduled pickup. Use this number, too, if you need to make a change or cancel a previously scheduled reservation.  If you need transportation services right away, call [phone]. The after-hours call center is staffed 24 hours to handle ride assistance and urgent reservation requests (including discharges) 365 days a year. Urgent trips include sick visits, hospital discharge requests and life-sustaining treatment.  Call the [facility] Line at [phone], at any time, 24 hours a day, 7 days a week. If you are in danger or need immediate medical attention call 911.  If you would like help to quit smoking, call 1-800-QUIT-NOW (816-547-7447) OR Espaol: 1-855-Djelo-Ya (3-295-188-4166) o para ms informacin haga clic aqu or Text READY to 063-016 to register via text  Matthew Lynch - following are the goals we discussed in your visit today:    Goals Addressed    Timeframe:  Long-Range Goal Priority:  High Start Date:      06/21/23                       Expected End Date:    ongoing                   Follow Up Date 11/23/23   - practice safe sex - schedule appointment for flu shot - schedule appointment for vaccines needed due to my age or health - schedule recommended health tests - schedule and keep appointment for annual check-up    Why is this important?   Screening tests can find diseases early when they are easier to treat.  Your doctor or nurse will talk with you about which tests are important for you.  Getting shots for common diseases like the flu and shingles will help prevent them.   10/26/23: recently seen by GI and PCP, has BSW appt this week and LCSW appt scheduled.  Patient verbalizes understanding of instructions and care plan provided today and agrees to view in MyChart. Active MyChart status and patient understanding of how to access instructions and care plan via MyChart confirmed with patient.     The Managed Medicaid care management team will reach out to the patient again over the next 30 business  days.  The  Patient has been provided with contact information for the Managed Medicaid care management team and has been advised to call with any health related questions or concerns.   Kathi Der RN, BSN, [employer] [facility] RN Care Manager Direct Dial [phone]/DUK [phone] Website: [email or web address]  Following is a copy of your plan of care:  Care Plan : RN Care Manager Plan of Care  Updates made by Matthew Chandler, RN since 10/26/2023 12:00 AM     Problem: Health Promotion or Disease Self-Management (General Plan of Care)      Long-Range Goal: Chronic Disease Management and Care Coordination Needs   Start Date: 06/21/2023  Expected End Date: 01/23/2024  Priority: High  Note:   Current Barriers:  Knowledge Deficits related to plan of care for management of DM,  neuropathy, HLD, chronic LBP, tobacco use, anxiety/depression, poor appetite, abdominal pain, fecal incontinence Care Coordination needs related to DM, neuropathy, HLD, chronic LBP, tobacco use, anxiety/depression, poor appetite, abdominal pain, fecal incontinence Chronic Disease Management support and education needs related to DM, neuropathy, HLD, chronic LBP, tobacco use, anxiety/depression, poor appetite, abdominal pain, fecal incontinence Financial Constraints, SDOH-job, housing 10/26/23: patient with anxiety/depression, feelings of helplessness and hopelessness.  Unstable housing situation.  Has BSW appt this week.  Did agree to LCSW appt and scheduled.  Recently seen by PCP and GI.  Insulin dose adjusted.  Average blood sugar 199.  Feels like GI symptoms are improved.  Smokes 6 cigarettes a day.  Waiting to hear on disability determination.    RNCM Clinical Goal(s):  verbalize understanding of plan for management of  DM, neuropathy, HLD, chronic LBP, tobacco use, anxiety/depression, poor appetite, abdominal pain, fecal incontinence   as evidenced by patient report verbalize basic understanding of  DM, neuropathy, HLD, chronic LBP, tobacco use, anxiety/depression, poor appetite, abdominal pain, fecal incontinence disease process and self health management plan as evidenced by patient report take all medications exactly as prescribed and will call provider for medication related questions as evidenced by patient report demonstrate understanding of rationale for each prescribed medication as evidenced by patient report attend all scheduled medical appointments as evidenced by patient report continue to work with RN Care Manager to address care management and care coordination needs related to DM, neuropathy, HLD, chronic LBP, tobacco use, anxiety/depression, poor appetite, abdominal pain, fecal incontinence as evidenced by adherence to CM Team Scheduled appointments work with Child psychotherapist to address   related to the management of resources, SDOH related to the management of DM, neuropathy, HLD, chronic LBP, tobacco use, anxiety/depression, poor appetite, abdominal pain, fecal incontinence   as evidenced by review of EMR and patient or social worker report demonstrate ongoing self health care management ability  as evidenced by patient report and EMR review through collaboration with Medical illustrator, provider, and care team.   Interventions: Evaluation of current treatment plan related to  self management and patient's adherence to plan as established by provider 07/22/23:  [facility] sent messages to PCP and BSW to update. 10/26/23: Collaborated with BSW and LCSW.  LCSW appt scheduled due to anxiety/depression and feelings of helplessness and hopelessness.  Diabetes Interventions:  (Status:  New goal.) Long Term Goal Assessed patient's understanding of A1c goal: <7% Reviewed medications with patient and discussed importance of medication adherence Counseled on importance of regular laboratory monitoring as prescribed Discussed plans with patient for ongoing care management follow up and provided patient with direct contact information for care management team Reviewed scheduled/upcoming provider appointments Advised patient, providing education and rationale, to check cbg as directed  and record, calling provider  for findings outside established parameters Review of patient status, including review of consultants reports, relevant laboratory and other test results, and medications completed Assessed social determinant of health barriers Lab Results  Component  Value Date   HGBA1C 9.2 10/25/23   Hyperlipidemia Interventions:  (Status:  New goal.) Long Term Goal Medication review performed; medication list updated in electronic medical record.  Counseled on importance of regular laboratory monitoring as prescribed Reviewed importance of limiting foods high in cholesterol Assessed social  determinant of health barriers   Pain Interventions:  (Status:  New goal.) Long Term Goal Pain assessment performed Medications reviewed Discussed importance of adherence to all scheduled medical appointments Reviewed with patient prescribed pharmacological and nonpharmacological pain relief strategies Assessed social determinant of health barriers  Smoking Cessation Interventions:  (Status:  New goal.) Long Term Goal Reviewed smoking history:   currently smoking 6 cigarettes a day Evaluation of current treatment plan reviewed Reviewed scheduled/upcoming provider appointments  Provided contact information for [place] Quit Line (1-800-QUIT-NOW) Discussed plans with patient for ongoing care management follow up and provided patient with direct contact information for care management team Assessed social determinant of health barriers  SDOH Barriers (Status:  ) Long Term Goal Patient interviewed and SDOH assessment performed        SDOH Interventions    Flowsheet Row Patient Outreach Telephone from 06/21/2023 in [place] POPULATION HEALTH DEPARTMENT Care Coordination from 06/09/2023 in [facility] Care Coordination Care Coordination from 03/17/2023 in [facility] Coord[facility] Office Visit from 02/18/2023 in [facility] Office Visit from 09/07/2022 in [facility] Office Visit from 05/17/2022 in [facility]  SDOH Interventions        Food Insecurity Interventions -- Other (Comment)  [receives food stamps limited to cooking locations when staying in car] Other (Comment)  [place] Foodstamps but has no other income] -- -- --  Housing Interventions -- Other (Comment)  [referral to communityresources] -- -- -- --  Transportation Interventions -- Intervention Not Indicated  [can't afford insurance, gas, etc] --  [Has access to Medicaid transportation] -- -- --  Utilities Interventions  -- -- Intervention Not Indicated -- -- --  Alcohol Usage Interventions -- Intervention Not Indicated (Score <7) -- -- -- --  Depression Interventions/Treatment  -- Medication, Counseling -- Counseling, Medication Currently on Treatment Counseling  Financial Strain Interventions -- Other (Comment)  [link with resources] -- -- -- --  Stress Interventions -- [employer], Provide Counseling -- -- -- --  Health Literacy Interventions Intervention Not Indicated -- -- -- -- --      Patient interviewed and appropriate assessments performed Referred patient to SW for assistance wit resources, SDOH Discussed plans with patient for ongoing care management follow up and provided patient with direct contact information for care management team  Patient Goals/Self-Care Activities: Take all medications as prescribed Attend all scheduled provider appointments Call pharmacy for medication refills 3-7 days in advance of running out of medications Perform all self care activities independently  Perform IADL's (shopping, preparing meals, housekeeping, managing finances) independently Call provider office for new concerns or questions  Work with the social worker to address care coordination needs and will continue to work with the clinical team to address health care and disease management related needs  Follow Up Plan:  The patient has been provided with contact information for the care management team and has been advised to call with any health related questions or concerns.  The care management team will reach out to the patient again over the next 30 business  days

## 2023-10-27 ENCOUNTER — Other Ambulatory Visit: Payer: Self-pay

## 2023-10-27 NOTE — Patient Outreach (Signed)
 Medicaid Managed Care Social Work Note  10/27/2023 Name:  Matthew Lynch MRN:  [id number] DOB:  09/28/1970  Matthew Lynch is an 53 y.o. year old male who is a primary patient of [name], MD.  The [facility] Managed Care Coordination team was consulted for assistance with:   housing   Mr. [name] was given information about Medicaid Managed Care Coordination team services today. Matthew Lynch Patient agreed to services and verbal consent obtained.  Engaged with patient  for by telephone forfollow up visit in response to referral for case management and/or care coordination services.   Patient is participating in a Managed Medicaid Plan:  Yes  Assessments/Interventions:  Review of past medical history, allergies, medications, health status, including review of consultants reports, laboratory and other test data, was performed as part of comprehensive evaluation and provision of chronic care management services.  SDOH: (Social Drivers of Health) assessments and interventions performed: SDOH Interventions    Flowsheet Row Patient Outreach Telephone from 10/26/2023 in [place] POPULATION HEALTH DEPARTMENT Patient Outreach Telephone from 09/06/2023 in [place] POPULATION HEALTH DEPARTMENT Patient Outreach Telephone from 07/22/2023 in [place] POPULATION HEALTH DEPARTMENT Patient Outreach Telephone from 06/21/2023 in [place] POPULATION HEALTH DEPARTMENT Care Coordination from 06/09/2023 in [facility] Care Coordination Care Coordination from 03/17/2023 in [facility] Care Coordination  SDOH Interventions        Food Insecurity Interventions -- -- -- -- Other (Comment)  [receives food stamps limited to cooking locations when staying in car] Other (Comment)  [Receives Foodstamps but has no other income]  Housing Interventions -- -- -- -- Other (Comment)  [referral to communityresources] --  Transportation Interventions -- -- -- --  Intervention Not Indicated  [can't afford insurance, gas, etc] --  [Has access to Medicaid transportation]  Utilities Interventions Intervention Not Indicated -- Intervention Not Indicated -- -- Intervention Not Indicated  Alcohol Usage Interventions Intervention Not Indicated (Score <7) -- Intervention Not Indicated (Score <7) -- Intervention Not Indicated (Score <7) --  Depression Interventions/Treatment  -- -- -- -- Medication, Counseling --  Financial Strain Interventions -- -- -- -- Other (Comment)  [link with resources] --  Stress Interventions -- -- -- -- [employer], Provide Counseling --  Health Literacy Interventions -- Intervention Not Indicated -- Intervention Not Indicated -- --      BSW completed a telephone outreach with patient he stated he does know if he received the resources. BSW will email resources to address on file. Patient states he still has not heard anything from disability. Advanced Directives Status:  Not addressed in this encounter.  Care Plan                 No Known Allergies  Medications Reviewed Today   Medications were not reviewed in this encounter     Patient Active Problem List   Diagnosis Date Noted   Neuropathy due to type 2 diabetes mellitus (HCC) 05/17/2022   Underweight 05/17/2022   Bowel and bladder incontinence 04/26/2022   Abdominal pain 04/23/2022   Depression, recurrent (HCC) 01/29/2020   Hyperglycemia    Hyperlipidemia    Tobacco abuse    Anxiety and depression    Type 2 diabetes mellitus (HCC) 01/24/2020   Chronic bilateral low back pain without sciatica 08/24/2018    Conditions to be addressed/monitored per PCP order:   housing  There are no care plans that you recently modified to display for this patient.   Follow up:  Patient agrees to Care Plan and Follow-up.  Plan: The  Patient has been provided with contact information for the Managed Medicaid care management team and has been advised to  call with any health related questions or concerns.   Matthew Lynch, MHA [facility]  Managed [facility] Social Worker [phone]

## 2023-10-27 NOTE — Patient Instructions (Signed)
 Visit Information  Matthew Lynch was given information about Medicaid Managed Care team care coordination services as a part of their Healthy [facility] Medicaid benefit. Matthew Lynch verbally consented to engagement with the [facility] Managed Care team.   If you are experiencing a medical emergency, please call 911 or report to your local emergency department or urgent care.   If you have a non-emergency medical problem during routine business hours, please contact your provider's office and ask to speak with a nurse.   For questions related to your Healthy [facility] health plan, please call: [phone] or visit the homepage here: [email or web address]  If you would like to schedule transportation through your Healthy [facility] plan, please call the following number at least 2 days in advance of your appointment: [phone]  For information about your ride after you set it up, call Ride Assist at [phone]. Use this number to activate a Will Call pickup, or if your transportation is late for a scheduled pickup. Use this number, too, if you need to make a change or cancel a previously scheduled reservation.  If you need transportation services right away, call [phone]. The after-hours call center is staffed 24 hours to handle ride assistance and urgent reservation requests (including discharges) 365 days a year. Urgent trips include sick visits, hospital discharge requests and life-sustaining treatment.  Call the [facility] Line at [phone], at any time, 24 hours a day, 7 days a week. If you are in danger or need immediate medical attention call 911.  If you would like help to quit smoking, call 1-800-QUIT-NOW (609-397-3756) OR Espaol: 1-855-Djelo-Ya (8-416-606-3016) o para ms informacin haga clic aqu or Text READY to 010-932 to register via text  Matthew Lynch - following are the goals we discussed in your visit today:    Goals Addressed   None     The  Patient                                              has been provided with contact information for the Managed Medicaid care management team and has been advised to call with any health related questions or concerns.   Matthew Lynch, Matthew Lynch, MHA [facility]  Managed Medicaid Social Worker [phone]   Following is a copy of your plan of care:  There are no care plans that you recently modified to display for this patient.

## 2023-10-28 ENCOUNTER — Other Ambulatory Visit: Payer: Self-pay | Admitting: Licensed Clinical Social Worker

## 2023-10-28 NOTE — Patient Outreach (Addendum)
  Medicaid Managed Care   Unsuccessful Attempt Note   10/28/2023 Name: Matthew Lynch MRN: [id number] DOB: 09/26/1970  Referred by: [name], MD Reason for referral : No chief complaint on file.   An unsuccessful telephone outreach was attempted today. The patient was referred to the case management team for assistance with care management and care coordination.  Patient answered the phone but could not hear this provider due to audio connection issues. Patient was advised that he will be rescheduled with the Care Guide team. IT department and supervision notified.   Follow Up Plan: The Managed Medicaid care management team will reach out to the patient again over the next 30 days.   Matthew Lynch, BSW, MSW, LCSW Licensed Clinical Social Worker [employer]   [facility] [email or web address] Direct Dial: [phone]

## 2023-11-01 ENCOUNTER — Ambulatory Visit: Payer: 59 | Admitting: Pharmacist

## 2023-11-02 NOTE — Telephone Encounter (Signed)
 Patient rescheduled for call on 3/6 with Social worker per patient request. Matthew Lynch was unable to make contact.    [name]  [facility]  Value-Based Care Institute, [facility] Guide  Direct Dial: [phone]  Fax [phone]

## 2023-11-03 ENCOUNTER — Ambulatory Visit: Payer: Self-pay | Admitting: Licensed Clinical Social Worker

## 2023-11-03 NOTE — Patient Instructions (Signed)
 Visit Information  Thank you for taking time to visit with me today. Please don't hesitate to contact me if I can be of assistance to you.   Following are the goals we discussed today:   Goals Addressed             This Visit's Progress    Better manage symptoms of Depression       Care Coordination Interventions: Assessed Social Determinants of Health Reviewed all upcoming appointments in Epic system Motivational Interviewing employed Depression screen reviewed  Solution-Focused Strategies employed:  Active listening / Reflection utilized  Emotional Support Provided Problem Solving /Task Center strategies reviewed  Contact your insurance for any benefits  you may be eligible for Contact 988 for MH crisis          Our next appointment is by telephone on 11/11/2023.  Please call the care guide team at [phone] if you need to cancel or reschedule your appointment.   If you are experiencing a Mental Health or Behavioral Health Crisis or need someone to talk to, please call the Suicide and Crisis Lifeline: 988  Patient verbalizes understanding of instructions and care plan provided today and agrees to view in MyChart. Active MyChart status and patient understanding of how to access instructions and care plan via MyChart confirmed with patient.     Telephone follow up appointment with care management team member scheduled for: No further follow up required: 11/11/2023

## 2023-11-03 NOTE — Patient Outreach (Signed)
 Care Coordination   Initial Visit Note   11/03/2023 Name: Matthew Lynch MRN: [id number] DOB: 09-Jan-1971  Matthew Lynch is a 53 y.o. year old male who sees Matthew Mola, MD for primary care. I spoke with  Matthew Lynch by phone today.  What matters to the patients health and wellness today?  Increasing access to stable housing.    Goals Addressed             This Visit's Progress    Better manage symptoms of Depression       Care Coordination Interventions: Assessed Social Determinants of Health Reviewed all upcoming appointments in Epic system Motivational Interviewing employed Depression screen reviewed  Solution-Focused Strategies employed:  Active listening / Reflection utilized  Emotional Support Provided Problem Solving /Task Center strategies reviewed  Contact your insurance for any benefits  you may be eligible for Contact 988 for MH crisis          SDOH assessments and interventions completed:  Yes     Care Coordination Interventions:  Yes, provided   Interventions Today    Flowsheet Row Most Recent Value  Chronic Disease   Chronic disease during today's visit Other  [Depression]  General Interventions   General Interventions Discussed/Reviewed General Interventions Discussed, [employer]  [Pt reported his biggest concern is lack of stable housing. Pt has previously worked with BSW who provided housing resources - pt will check for receipt of those. CSW also encouraged pt to reach out to [facility] for any benefits to reduce stressors]  Education Interventions   Education Provided Provided Education  [Pt has not heard from social security regarding his disability. CSW attempted to discuss plans if disability does not go through - pt did not want to discuss that at this time.]  Mental Health Interventions   Mental Health Discussed/Reviewed Mental Health Discussed, Mental Health Reviewed, Coping Strategies, Anxiety, Depression  [CSW and pt  discussed his MH symptoms including anxietyand depression. Pt feels that no one cares for him and nothing is changing. CSW and pt briefly discussed his coping skills - CSW will attempt to explore new coping skills/behavioral activation later.]        Follow up plan: Follow up call scheduled for 11/11/2023    Encounter Outcome:  Patient Visit Completed   Kenton Kingfisher, LCSW [place]/Value Based Care Institute, [facility] Licensed Clinical Social Worker Care Coordinator [phone]

## 2023-11-11 ENCOUNTER — Ambulatory Visit: Payer: Self-pay | Admitting: Licensed Clinical Social Worker

## 2023-11-11 NOTE — Patient Outreach (Signed)
 Care Coordination   Follow Up Visit Note   11/11/2023 Name: Matthew Lynch MRN: [id number] DOB: Jan 04, 1971  Matthew Lynch is a 53 y.o. year old male who sees [name], MD for primary care. I spoke with  [name] by phone today.  What matters to the patients health and wellness today?  Increasing access to community resources.    Goals Addressed             This Visit's Progress    Better manage symptoms of Depression       Care Coordination Interventions: Assessed Social Determinants of Health Reviewed all upcoming appointments in Epic system Motivational Interviewing employed Depression screen reviewed  Solution-Focused Strategies employed:  Active listening / Reflection utilized  Emotional Support Provided Problem Solving /Task Center strategies reviewed  Contact your insurance for any benefits  you may be eligible for Contact 988 for MH crisis Look out for Contact from [employer] [place] for connection Utilize coping skills as needed to manage symptoms of anxiety           SDOH assessments and interventions completed:  Yes     Care Coordination Interventions:  Yes, provided   Interventions Today    Flowsheet Row Most Recent Value  Chronic Disease   Chronic disease during today's visit Other  [Anxiety]  General Interventions   General Interventions Discussed/Reviewed [employer], General Interventions Discussed  [CSW spoke with [employer] [facility] [place] and requested referral to their case management services for any benefits through their program. CSW encouraged pt to follow up on housing resources BSW sent to pt.]  Mental Health Interventions   Mental Health Discussed/Reviewed Mental Health Discussed, Mental Health Reviewed, Coping Strategies, Depression, Anxiety  [Pt endorsed feeling of hopelessness and anxiety related to housing situation. CSW assessed for safety and attempted to review pt's coping skills and trigger regarding anxiety. CSW provide  emotional support and encouraged pt to consider ongoing psych]        Follow up plan: Follow up call scheduled for 12/02/2023    Encounter Outcome:  Patient Visit Completed   Kenton Kingfisher, LCSW [place], [facility] Licensed Clinical Social Worker Care Coordinator [phone]

## 2023-11-11 NOTE — Patient Instructions (Signed)
 Visit Information  Thank you for taking time to visit with me today. Please don't hesitate to contact me if I can be of assistance to you.   Following are the goals we discussed today:   Goals Addressed             This Visit's Progress    Better manage symptoms of Depression       Care Coordination Interventions: Assessed Social Determinants of Health Reviewed all upcoming appointments in Epic system Motivational Interviewing employed Depression screen reviewed  Solution-Focused Strategies employed:  Active listening / Reflection utilized  Emotional Support Provided Problem Solving /Task Center strategies reviewed  Contact your insurance for any benefits  you may be eligible for Contact 988 for MH crisis Look out for Contact from [facility] for connection Utilize coping skills as needed to manage symptoms of anxiety           Our next appointment is by telephone on 12/02/2023.  Please call the care guide team at [phone] if you need to cancel or reschedule your appointment.   If you are experiencing a Mental Health or Behavioral Health Crisis or need someone to talk to, please call the Suicide and Crisis Lifeline: 988  Patient verbalizes understanding of instructions and care plan provided today and agrees to view in MyChart. Active MyChart status and patient understanding of how to access instructions and care plan via MyChart confirmed with patient.     Telephone follow up appointment with care management team member scheduled for: 12/02/2023

## 2023-11-17 DIAGNOSIS — R350 Frequency of micturition: Secondary | ICD-10-CM | POA: Diagnosis not present

## 2023-11-17 DIAGNOSIS — R102 Pelvic and perineal pain: Secondary | ICD-10-CM | POA: Diagnosis not present

## 2023-11-23 ENCOUNTER — Ambulatory Visit: Payer: Self-pay

## 2023-11-23 ENCOUNTER — Ambulatory Visit: Payer: Self-pay | Admitting: Obstetrics and Gynecology

## 2023-11-23 NOTE — Patient Outreach (Signed)
 Care Coordination   Follow Up Visit Note   11/23/2023 Name: Matthew Lynch MRN: [id number] DOB: Jul 07, 1971  Matthew Lynch is a 53 y.o. year old male who sees Matthew Mola, MD for primary care. I spoke with  Matthew Lynch by phone today.  What matters to the patients health and wellness today?  I spoke with the patient but he was unable to talk at the moment because he was getting a hair cut and asked if I could call back at 330 pm.  When I return the call it went to voicemail and I left a message.      SDOH assessments and interventions completed:  No     Care Coordination Interventions:  No, not indicated   Follow up plan:  Will have the care guide to reschedule the appointment    Encounter Outcome:  Patient Visit Completed   Matthew Fairly RN, BSN, [facility]  [facility], [facility]  Care Coordinator Phone: [phone]

## 2023-12-02 ENCOUNTER — Ambulatory Visit: Payer: Self-pay | Admitting: Licensed Clinical Social Worker

## 2023-12-02 NOTE — Patient Outreach (Signed)
 Care Coordination   Follow Up Visit Note   12/02/2023 Name: Matthew Lynch MRN: [id number] DOB: 11/19/1970  Cope Marte is a 53 y.o. year old male who sees [name], MD for primary care. I spoke with  Matthew Lynch by phone today.  What matters to the patients health and wellness today?  Better managing my mental health symptoms.    Goals Addressed             This Visit's Progress    Better manage symptoms of Depression       Care Coordination Interventions: Assessed Social Determinants of Health Reviewed all upcoming appointments in Epic system Motivational Interviewing employed Depression screen reviewed  Solution-Focused Strategies employed:  Active listening / Reflection utilized  Emotional Support Provided Problem Solving /Task Center strategies reviewed  Contact your insurance for any benefits  you may be eligible for Contact 988 for MH crisis Look out for Contact from [employer] [facility] [place] for connection Utilize coping skills as needed to manage symptoms of anxiety Utilize Medicaid transportation for doctors appointments           SDOH assessments and interventions completed:  Yes     Care Coordination Interventions:  Yes, provided   Interventions Today    Flowsheet Row Most Recent Value  Chronic Disease   Chronic disease during today's visit Other  [Depression]  General Interventions   General Interventions Discussed/Reviewed [employer], General Interventions Discussed  [CSW educated pt on Medicaid transportation options he has available through [employer] - pt voiced confidence in being able to utilize this.]  Education Interventions   Education Provided Provided Education  [Pt agreed to consider community mental health options and we will discuss atnext scheduled appt.]  Mental Health Interventions   Mental Health Discussed/Reviewed Mental Health Discussed, Mental Health Reviewed, Coping Strategies, Depression, Anxiety  [Pt  reported having low energy but thinks this is related to medical issues. CSW encourage pt to reach out to PCP office regarding this. Pt did not want to discuss current MH status,  wediscussed benefits of ongoing counseling and community options.]        Follow up plan: Follow up call scheduled for 12/09/2023    Encounter Outcome:  Patient Visit Completed   Kenton Kingfisher, LCSW [place], [facility] Licensed Clinical Social Worker Care Coordinator [phone]

## 2023-12-07 ENCOUNTER — Ambulatory Visit: Payer: 59 | Admitting: Internal Medicine

## 2023-12-09 ENCOUNTER — Ambulatory Visit: Payer: Self-pay | Admitting: Licensed Clinical Social Worker

## 2023-12-09 NOTE — Patient Instructions (Signed)
 Visit Information  Thank you for taking time to visit with me today. Please don't hesitate to contact me if I can be of assistance to you before our next scheduled appointment.   Patient has met all care management goals. Care Management case will be closed. Patient has been provided contact information should new needs arise.   Please call the care guide team at [phone] if you need to cancel, schedule, or reschedule an appointment.   Please call the Suicide and Crisis Lifeline: 988 if you are experiencing a Mental Health or Behavioral Health Crisis or need someone to talk to.  Kenton Kingfisher, LCSW [place], [facility] Licensed Clinical Social Worker Care Coordinator [phone]

## 2023-12-09 NOTE — Patient Outreach (Signed)
 Complex Care Management   Visit Note  12/09/2023  Name:  Matthew Lynch MRN: [id number] DOB: [birth year]  Situation: Referral received for Complex Care Management related to [place]/Behavioral Health diagnosis Depression  I obtained verbal consent from Patient.  Visit completed with Patient  on the phone  Background:   Past Medical History:  Diagnosis Date   Anxiety    Depression    Diabetes mellitus without complication (HCC)     Assessment: Patient Reported Symptoms:  Cognitive Alert and oriented to person, place, and time, Normal speech and language skills  Neurological No symptoms reported    HEENT Not assessed    Cardiovascular Not assessed    Respiratory Not assesed    Endocrine Not assessed    Gastrointestinal Not assessed    Genitourinary Not assessed    Integumentary Not assessed    Musculoskeletal Not assessed    Psychosocial Depression - if selected complete PHQ 2-9 (Pt has denied MH counseling in the past but agreed to consider connecting with [facility])     There were no vitals filed for this visit.  Medications Reviewed Today   Medications were not reviewed in this encounter     Recommendation:   Follow up with [facility] for [facility] services if you would like to connect with someone for ongoing counseling services.  Follow Up Plan:   Patient has met all care management goals. Care Management case will be closed. Patient has been provided contact information should new needs arise.   Kenton Kingfisher, LCSW [place], [facility] Licensed Clinical Social Worker Care Coordinator [phone]

## 2023-12-30 ENCOUNTER — Telehealth: Payer: Self-pay

## 2023-12-30 DIAGNOSIS — Z91199 Patient's noncompliance with other medical treatment and regimen due to unspecified reason: Secondary | ICD-10-CM | POA: Diagnosis not present

## 2023-12-30 DIAGNOSIS — Z720 Tobacco use: Secondary | ICD-10-CM | POA: Diagnosis not present

## 2023-12-30 DIAGNOSIS — Z681 Body mass index (BMI) 19 or less, adult: Secondary | ICD-10-CM | POA: Diagnosis not present

## 2023-12-30 DIAGNOSIS — E785 Hyperlipidemia, unspecified: Secondary | ICD-10-CM | POA: Diagnosis not present

## 2023-12-30 DIAGNOSIS — Z809 Family history of malignant neoplasm, unspecified: Secondary | ICD-10-CM | POA: Diagnosis not present

## 2023-12-30 DIAGNOSIS — Z823 Family history of stroke: Secondary | ICD-10-CM | POA: Diagnosis not present

## 2023-12-30 DIAGNOSIS — E1140 Type 2 diabetes mellitus with diabetic neuropathy, unspecified: Secondary | ICD-10-CM | POA: Diagnosis not present

## 2023-12-30 DIAGNOSIS — F321 Major depressive disorder, single episode, moderate: Secondary | ICD-10-CM | POA: Diagnosis not present

## 2023-12-30 DIAGNOSIS — Z833 Family history of diabetes mellitus: Secondary | ICD-10-CM | POA: Diagnosis not present

## 2023-12-30 DIAGNOSIS — R32 Unspecified urinary incontinence: Secondary | ICD-10-CM | POA: Diagnosis not present

## 2023-12-30 DIAGNOSIS — R636 Underweight: Secondary | ICD-10-CM | POA: Diagnosis not present

## 2023-12-30 DIAGNOSIS — M4800 Spinal stenosis, site unspecified: Secondary | ICD-10-CM | POA: Diagnosis not present

## 2023-12-30 NOTE — Telephone Encounter (Signed)
 Patient calls nurse line requesting referral for legal aide.   Patient reports that he received referral last year, however, this was denied as he had not received disability denial.   He reports that he has now received this denial and would like to proceed with the assistance of legal aide.   Will forward to provider covering for Dr. Mauri Sous.   Elsie Halo, RN

## 2024-01-10 ENCOUNTER — Other Ambulatory Visit: Payer: Self-pay

## 2024-01-10 ENCOUNTER — Telehealth: Payer: Self-pay

## 2024-01-10 DIAGNOSIS — Z789 Other specified health status: Secondary | ICD-10-CM

## 2024-01-10 DIAGNOSIS — E1140 Type 2 diabetes mellitus with diabetic neuropathy, unspecified: Secondary | ICD-10-CM

## 2024-01-10 DIAGNOSIS — R636 Underweight: Secondary | ICD-10-CM

## 2024-01-10 DIAGNOSIS — R739 Hyperglycemia, unspecified: Secondary | ICD-10-CM

## 2024-01-10 NOTE — Patient Outreach (Signed)
 Complex Care Management   Visit Note  01/10/2024  Name:  Matthew Lynch MRN: [id number] DOB: Jul 30, 1971  Situation: Referral received for Complex Care Management related to Diabetes with Complications I obtained verbal consent from Patient.  Visit completed with patient  on the phone  Background:   Past Medical History:  Diagnosis Date   Anxiety    Depression    Diabetes mellitus without complication (HCC)     Assessment: Patient Reported Symptoms:  Cognitive Cognitive Status: Able to follow simple commands, Alert and oriented to person, place, and time, Normal speech and language skills      Neurological      HEENT HEENT Symptoms Reported: No symptoms reported      Cardiovascular Cardiovascular Symptoms Reported: Chest pain or discomfort Does patient have uncontrolled Hypertension?: No Cardiovascular Comment: says that he has it sometimes while sleep and wake up with sweat.  He has discussed with his physician  Respiratory Respiratory Symptoms Reported: No symptoms reported    Endocrine Patient reports the following symptoms related to hypoglycemia or hyperglycemia : Blurry vision Is patient diabetic?: Yes Is patient checking blood sugars at home?: Yes Endocrine Management Strategies: Medical device, Medication therapy, Exercise Endocrine Self-Management Outcome: 3 (uncertain)  Gastrointestinal Gastrointestinal Symptoms Reported: Diarrhea, Incontinence Gastrointestinal Management Strategies: Medication therapy    Genitourinary Genitourinary Symptoms Reported: No symptoms reported    Integumentary      Musculoskeletal Musculoskelatal Symptoms Reviewed: Weakness, Difficulty walking Musculoskeletal Conditions: Back pain Musculoskeletal Management Strategies: Medication therapy Falls in the past year?: No    Psychosocial       Quality of Family Relationships: supportive Do you feel physically threatened by others?: No      01/10/2024   12:00 PM  Depression screen  PHQ 2/9  Decreased Interest 3  Down, Depressed, Hopeless 3  PHQ - 2 Score 6  Altered sleeping 3  Tired, decreased energy 3  Change in appetite 3  Feeling bad or failure about yourself  3  Trouble concentrating 3  Moving slowly or fidgety/restless 3  Suicidal thoughts 0  PHQ-9 Score 24    There were no vitals filed for this visit.  Medications Reviewed Today     Reviewed by Augustin Leber, RN (Registered Nurse) on 01/10/24 at 1143  Med List Status: <None>   Medication Order Taking? Sig Documenting Provider Last Dose Status Informant  Continuous Glucose Sensor (DEXCOM G7 SENSOR) MISC [id number] Yes 1 Device by Does not apply route as directed. Apply new sensor every 10 days. Santa Cuba, MD Taking Active   DULoxetine  (CYMBALTA ) 60 MG capsule [id number] Yes Take 1 capsule (60 mg total) by mouth 2 (two) times daily. Santa Cuba, MD Taking Active   feeding supplement, GLUCERNA SHAKE, (GLUCERNA SHAKE) LIQD [id number] Yes Take 237 mLs by mouth 3 (three) times daily between meals. Santa Cuba, MD Taking Active   insulin  aspart (FIASP  FLEXTOUCH) 100 UNIT/ML FlexTouch Pen [id number] Yes Inject 4-7 Units into the skin 3 (three) times daily before meals. Santa Cuba, MD Taking Active   Insulin  Glargine (BASAGLAR  [facility]) 100 UNIT/ML [id number] Yes Inject 22 Units into the skin every morning. Edison Gore, MD Taking Active   Insulin  Pen Needle (BD PEN NEEDLE NANO U/F) 32G X 4 MM MISC [id number] Yes Use as directed once a day Santa Cuba, MD Taking Active   loperamide (IMODIUM) 2 MG capsule [id number] Yes Take 2 mg by mouth daily as needed for diarrhea or loose stools. [provider] Taking Active  Med Note Abbey Abbe   Tue Jun 07, 2023  3:34 PM) Takes ~4 days weekly  polyethylene glycol-electrolytes (NULYTELY) 420 g solution [id number] No do 6 oz every 30 mins until the impaction passes, can do 1/2 gallon on first day and 1/2 gallon on 2nd day.  Can continue 2 fleets  enemas a day until it resolves.  Patient not taking: Reported on 01/10/2024   Edmonia Gottron, PA-C Not Taking Active   rosuvastatin  (CRESTOR ) 10 MG tablet [id number] Yes Take 1 tablet (10 mg total) by mouth daily. McDiarmid, Demetra Filter, MD Taking Active   triamcinolone  ointment (KENALOG ) 0.5 % [id number] Yes Apply 1 Application topically 2 (two) times daily. McDiarmid, Demetra Filter, MD Taking Active             Recommendation:   PCP Follow-up  Follow Up Plan:   Telephone follow up appointment with care management team member scheduled for:  02/10/24  11 am      Augustin Leber RN, BSN, [facility]  [facility], [facility]  Care Coordinator Phone: [phone]

## 2024-01-10 NOTE — Patient Instructions (Signed)
 Visit Information  Thank you for taking time to visit with me today. Please don't hesitate to contact me if I can be of assistance to you before our next scheduled appointment.  Your next care management appointment is a Telephone follow up appointment on 02/10/24  11 am  Please call the care guide team at [phone] if you need to cancel, schedule, or reschedule an appointment.   Please call 1-800-273-TALK (toll free, 24 hour hotline) if you are experiencing a Mental Health or Behavioral Health Crisis or need someone to talk to.  Augustin Leber RN, BSN, [facility]  [facility], [facility]  Care Coordinator Phone: [phone]

## 2024-01-13 ENCOUNTER — Telehealth: Payer: Self-pay

## 2024-01-13 NOTE — Progress Notes (Signed)
 Complex Care Management Note Care Guide Note  01/13/2024 Name: Matthew Lynch MRN: [id number] DOB: 1971/06/03  Matthew Lynch is a 53 y.o. year old male who is a primary care patient of Santa Cuba, MD . The community resource team was consulted for assistance with Food Insecurity and housing.  SDOH screenings and interventions completed:  Yes  Social Drivers of Health From This Encounter   Food Insecurity: Food Insecurity Present (01/13/2024)   Hunger Vital Sign    Worried About Running Out of Food in the Last Year: Sometimes true    Ran Out of Food in the Last Year: Sometimes true  Housing: Patient Declined (01/13/2024)   Housing Stability Vital Sign    Unable to Pay for Housing in the Last Year: Patient declined    Number of Times Moved in the Last Year: 5    Homeless in the Last Year: Patient declined  Financial Resource Strain: Patient Declined (01/13/2024)   Overall Financial Resource Strain (CARDIA)    Difficulty of Paying Living Expenses: Patient declined  Transportation Needs: No Transportation Needs (01/13/2024)   PRAPARE - Administrator, Civil Service (Medical): No    Lack of Transportation (Non-Medical): No  Utilities: Not At Risk (01/13/2024)   Utilities    Threatened with loss of utilities: No    SDOH Interventions Today    Flowsheet Row Most Recent Value  SDOH Interventions   Food Insecurity Interventions Other (Comment)  [Verified email address to send food pantry list.]  Housing Interventions Other (Comment)  [Patient request housing information be emailed to him.]        Care guide performed the following interventions: Patient provided with information about care guide support team and interviewed to confirm resource needs.  Follow Up Plan:  No further follow up planned at this time. The patient has been provided with needed resources.  Encounter Outcome:  Patient Visit Completed  Marquisa Salih [facility]  [facility]  Resource Care Guide Direct Dial : [phone]  Fax: [phone] Website: [email or web address]

## 2024-02-02 ENCOUNTER — Telehealth: Payer: Self-pay | Admitting: Family Medicine

## 2024-02-02 NOTE — Telephone Encounter (Signed)
 Patient was identified as falling into the [place] Measure - Diabetes.   Patient was: Appointment scheduled for lab or office visit for A1c.

## 2024-02-09 ENCOUNTER — Encounter: Payer: Self-pay | Admitting: Family Medicine

## 2024-02-09 ENCOUNTER — Ambulatory Visit (INDEPENDENT_AMBULATORY_CARE_PROVIDER_SITE_OTHER): Admitting: Family Medicine

## 2024-02-09 VITALS — BP 110/73 | HR 77

## 2024-02-09 DIAGNOSIS — F32A Depression, unspecified: Secondary | ICD-10-CM

## 2024-02-09 DIAGNOSIS — Z609 Problem related to social environment, unspecified: Secondary | ICD-10-CM

## 2024-02-09 DIAGNOSIS — F419 Anxiety disorder, unspecified: Secondary | ICD-10-CM

## 2024-02-09 DIAGNOSIS — M62838 Other muscle spasm: Secondary | ICD-10-CM | POA: Diagnosis not present

## 2024-02-09 DIAGNOSIS — E1140 Type 2 diabetes mellitus with diabetic neuropathy, unspecified: Secondary | ICD-10-CM | POA: Diagnosis not present

## 2024-02-09 DIAGNOSIS — M6281 Muscle weakness (generalized): Secondary | ICD-10-CM | POA: Diagnosis not present

## 2024-02-09 DIAGNOSIS — R102 Pelvic and perineal pain: Secondary | ICD-10-CM | POA: Diagnosis not present

## 2024-02-09 DIAGNOSIS — Z794 Long term (current) use of insulin: Secondary | ICD-10-CM

## 2024-02-09 DIAGNOSIS — N3941 Urge incontinence: Secondary | ICD-10-CM | POA: Diagnosis not present

## 2024-02-09 DIAGNOSIS — E1165 Type 2 diabetes mellitus with hyperglycemia: Secondary | ICD-10-CM

## 2024-02-09 DIAGNOSIS — R159 Full incontinence of feces: Secondary | ICD-10-CM | POA: Diagnosis not present

## 2024-02-09 DIAGNOSIS — M6289 Other specified disorders of muscle: Secondary | ICD-10-CM | POA: Diagnosis not present

## 2024-02-09 LAB — POCT GLYCOSYLATED HEMOGLOBIN (HGB A1C): HbA1c, POC (controlled diabetic range): 9.4 % — AB (ref 0.0–7.0)

## 2024-02-09 MED ORDER — BD PEN NEEDLE NANO U/F 32G X 4 MM MISC: 1 refills | Status: AC

## 2024-02-09 NOTE — Assessment & Plan Note (Addendum)
 A1c elevated today at 9.4. Unclear if patient using insulin  daily. Per chart review of [facility] notes, patient with mod-severe cervical stenosis and suspect patient's autonomic symptoms secondary to diabetic autonomic neuropathy.  - Discussed increasing to 24 units of LAI daily. Patient then expressed interest in trying to use 22u LAI daily before increasing.  - Continue Cymbalta  60 BID for neuropathy  - Scheduled patient to see Dr Koval on 02/22/24 for diabetes follow up  - UAC today  - Foot exam completed 10/25/23 with overall normal findings.  - Discussed yearly eye exam - has upcoming appt on June 18th

## 2024-02-09 NOTE — Progress Notes (Signed)
    SUBJECTIVE:   CHIEF COMPLAINT / HPI:   T2DM w/ neuropathy  Reports taking 22 LAI daily with 4-8 SAI with meals. Has dexcom. Feels neuropathy has been worsening - reports constant burning pain from his groin down to his feet. Cymbalta  helps some. Has continued incontinence, of both bladder and bowels. Follows with neurosurgery, may get surgery once his A1c is better controlled.   Anxiety/Depression Mood has been challenging, with continued anxious thoughts and worries. His chronic health conditions continue to weigh on him. Has passive SI including thoughts that he may be better off dead. No active SI/HI, no desires of plans to hurt oneself or others. Is open to trying talk therapy.   PERTINENT  PMH / PSH: T2DM w neuropathy, HLD, Anxiety/Depression   OBJECTIVE:   BP 110/73   Pulse 77   SpO2 100%   General: Well-appearing. Resting comfortably in room. CV: Normal S1/S2. No extra heart sounds. Warm and well-perfused. Pulm: Breathing comfortably on room air. CTAB. No increased WOB. Skin:  Warm, dry. MSK: Bilateral lower back soft tissue tenderness, no significant spinal bony tenderness. Negative straight leg raise test bilaterally.       02/09/2024    2:16 PM 01/10/2024   12:00 PM 10/25/2023    1:46 PM  Depression screen PHQ 2/9  Decreased Interest 3 3 3   Down, Depressed, Hopeless 3 3 3   PHQ - 2 Score 6 6 6   Altered sleeping 3 3 3   Tired, decreased energy 3 3 3   Change in appetite 3 3 3   Feeling bad or failure about yourself  3 3 3   Trouble concentrating 3 3 3   Moving slowly or fidgety/restless 3 3 2   Suicidal thoughts 0 0 1  PHQ-9 Score 24 24 24      ASSESSMENT/PLAN:   Assessment & Plan Type 2 diabetes mellitus with hyperglycemia, with long-term current use of insulin  (HCC) A1c elevated today at 9.4. Unclear if patient using insulin  daily. Per chart review of [facility] notes, patient with mod-severe cervical stenosis and suspect patient's autonomic symptoms  secondary to diabetic autonomic neuropathy.  - Discussed increasing to 24 units of LAI daily. Patient then expressed interest in trying to use 22u LAI daily before increasing.  - Continue Cymbalta  60 BID for neuropathy  - Scheduled patient to see Dr Koval on 02/22/24 for diabetes follow up  - UAC today  - Foot exam completed 10/25/23 with overall normal findings.  - Discussed yearly eye exam - has upcoming appt on June 18th  Anxiety and depression PHQ-9 score of 24 today, stable with recent visits. No active SI/HI.  - Discussed and provided resources for talk therapy  - Cont Cymbalta   Poor social situation Patient has upcoming phone call with social team tomorrow. Encouraged patient to discuss all concerns and questions during this phone call.     Future Appointments  Date Time Provider Department Center  02/10/2024 11:00 AM Augustin Leber, RN [facility] None  02/22/2024  9:30 AM Abbey Abbe, RPH-CPP [facility]    Carey Chapman, MD [facility]

## 2024-02-09 NOTE — Assessment & Plan Note (Deleted)
 UAC today   Foot exam completed 10/25/23   Discussed yearly eye exam - has appt in June

## 2024-02-09 NOTE — Patient Instructions (Addendum)
 Therapy and Counseling Resources Most providers on this list will take Medicaid. Patients with commercial insurance or Medicare should contact their insurance company to get a list of in network providers.  [facility]:Psychiatry and Counseling [address] [place], [place] [address] [phone]  [facility]  [address], Suite [place], [place] [address]      [phone]  [facility] [address]  [place], [place] [address] [phone]  [facility] [address]  [place], [place] [address]       [phone]     [facility] (virtual only) [phone]  [facility] [address], [place], [place] [phone]  [facility]:  [address] [place] [phone]  [facility]:  [address] [place] [phone]  [facility] (under & uninsured) [address]   [place] [phone]    [email or web address]    [facility] [address]  [place]    [phone]  [facility] -[address]     Phone:  [phone]     [facility]-  [address]  [phone]   [facility] [address]      [place], [place] [phone]  [facility]: [address] [place], [place], [place]  [phone]   [facility] (Spanish therapist) [email or web address]  [address] [place]  [address]   [place] [address]    [phone]    [facility]   [address],  [place], [place]  [phone]   [facility]  [address] [place]  [phone]  [facility]  [address] [place], [place]        [phone]  Open Access/Walk In Clinic under & uninsured  [facility]  [address] [place], [place]  [phone] Crisis [phone]  [facility] [address],  (Spanish)   [address] , [place]: [phone]) 8:30 - 12; 1 - 2:30  [facility] [employer],  [address] [place], [place]    [phone]):8:30 - 12; 2 - 3PM  [facility] [employer],  [address]  [place]; [phone]):   Mon - Fri 8 AM - 5 PM  [facility] [address] [place]  MWF 12:30 to 3:00 or call to schedule an appointment  [phone]  Specific Provider options Psychology Today  [email or web address].psychologytoday.com/us  click on find a therapist  enter your zip code left side and select or tailor a therapist for your specific need.   [facility] Provider Directory [email or web address]  (Medicaid)   Follow all drop down to find a provider  Social Support program [facility] [phone] or PhotoSolver.pl [address], [place], [place] Recovery support and educational   24- Hour Availability:   [facility]  [address] [place], [place] [phone] Crisis [phone]  [facility] [phone]  [place] [facility]  [phone]   [facility]  [phone] (after hours)  Therapeutic Alternative/Mobile Crisis   [phone]  [place]  National Suicide Hotline  [phone] Derrel Flies)  Call 911 or go to emergency room  [facility]  [phone]);  [place] and [employer]  [phone]); [place],  [place], [place], [place], [place], [place] Thank you for visiting clinic today and allowing us  to participate in your care!  Your A1c is still high today. Please increase your long acting insulin  to 24 units per day. Continue taking your mealtime insulin  as you are.   We collected a urine sample today and I will contact you with the results.   Reach out any time with  any questions or concerns you may have - we are here for you!  Carey Chapman, MD [facility] [phone]  Your upcoming eye doctor appointment:  [facility] Location: [address]  Time: June 18th at 10:45 AM   Your upcoming pharmacy appointment: 02/22/24 at 9:30 AM at our [facility]

## 2024-02-09 NOTE — Assessment & Plan Note (Addendum)
 PHQ-9 score of 24 today, stable with recent visits. No active SI/HI.  - Discussed and provided resources for talk therapy  - Cont Cymbalta 

## 2024-02-10 ENCOUNTER — Other Ambulatory Visit: Payer: Self-pay

## 2024-02-10 ENCOUNTER — Ambulatory Visit: Payer: Self-pay | Admitting: Family Medicine

## 2024-02-10 LAB — MICROALBUMIN / CREATININE URINE RATIO
Creatinine, Urine: 90.3 mg/dL
Microalb/Creat Ratio: 7 mg/g{creat} (ref 0–29)
Microalbumin, Urine: 6.5 ug/mL

## 2024-02-10 NOTE — Patient Outreach (Signed)
 Complex Care Management   Visit Note  02/10/2024  Name:  Matthew Lynch MRN: [id number] DOB: Dec 01, 1970  Situation: Referral received for Complex Care Management related to Diabetes with Complications I obtained verbal consent from Patient.  Visit completed with patient  on the phone  Background:   Past Medical History:  Diagnosis Date   Anxiety    Depression    Diabetes mellitus without complication (HCC)     Assessment:  I had a discussion with Matthew Lynch, who indicated that his blood sugar level was 44 this morning. After consuming half a cup of ginger ale, his blood sugar rose to 140 within approximately 5 to 10 minutes. He noted that this occurrence was atypical, as he does not often experience low blood sugar levels. I informed him that I would communicate this information to Signa Drier. Matthew Lynch is scheduled for an appointment on February 22, 2024, and I advised him to attend. Patient Reported Symptoms:  Cognitive Cognitive Status: Able to follow simple commands, Alert and oriented to person, place, and time, Normal speech and language skills      Neurological Neurological Review of Symptoms: Dizziness, Headaches Neurological Conditions: Headache Neurological Management Strategies: Medication therapy Neurological Comment: patient states he will just will lay down  HEENT HEENT Symptoms Reported: No symptoms reported      Cardiovascular Cardiovascular Symptoms Reported: Chest pain or discomfort Does patient have uncontrolled Hypertension?: No Cardiovascular Comment: He says that he has chest pain from smoking  Respiratory Respiratory Symptoms Reported: No symptoms reported    Endocrine Is patient diabetic?: Yes Is patient checking blood sugars at home?: Yes Endocrine Conditions: Diabetes Endocrine Management Strategies: Medication therapy, Medical device  Gastrointestinal Gastrointestinal Symptoms Reported: Diarrhea, Incontinence Gastrointestinal Conditions:  Diarrhea Gastrointestinal Management Strategies: Incontinence garment/pad Gastrointestinal Comment: patient is seeing a physician    Genitourinary Genitourinary Symptoms Reported: Incontinence Genitourinary Conditions: Incontinence Genitourinary Management Strategies: Incontinence garment/pad  Integumentary Integumentary Symptoms Reported: Other Other Integumentary Symptoms: Rash Skin Management Strategies: Medication therapy Skin Comment: uses cream  Musculoskeletal Musculoskelatal Symptoms Reviewed: Weakness, Difficulty walking Musculoskeletal Conditions: Back pain, Mobility limited Falls in the past year?: No    Psychosocial       Quality of Family Relationships: supportive Do you feel physically threatened by others?: No      02/10/2024   11:33 AM  Depression screen PHQ 2/9  Decreased Interest 3  Down, Depressed, Hopeless 3  PHQ - 2 Score 6  Altered sleeping 3  Tired, decreased energy 3  Change in appetite 3  Feeling bad or failure about yourself  3  Trouble concentrating 3  Moving slowly or fidgety/restless 3  Suicidal thoughts 0  PHQ-9 Score 24    There were no vitals filed for this visit.  Medications Reviewed Today     Reviewed by Augustin Leber, RN (Registered Nurse) on 02/10/24 at 1113  Med List Status: <None>   Medication Order Taking? Sig Documenting Provider Last Dose Status Informant  Continuous Glucose Sensor (DEXCOM G7 SENSOR) MISC [id number] Yes 1 Device by Does not apply route as directed. Apply new sensor every 10 days. Santa Cuba, MD  Active   DULoxetine  (CYMBALTA ) 60 MG capsule [id number] Yes Take 1 capsule (60 mg total) by mouth 2 (two) times daily. Santa Cuba, MD  Active   feeding supplement, GLUCERNA SHAKE, (GLUCERNA SHAKE) LIQD [id number] Yes Take 237 mLs by mouth 3 (three) times daily between meals. Santa Cuba, MD  Active   insulin  aspart (FIASP  FLEXTOUCH) 100 UNIT/ML FlexTouch Pen  [id number] Yes Inject 4-7 Units into the skin 3 (three)  times daily before meals. Santa Cuba, MD  Active   Insulin  Glargine (BASAGLAR  [place]) 100 UNIT/ML [id number] Yes Inject 22 Units into the skin every morning. Edison Gore, MD  Active   Insulin  Pen Needle (BD PEN NEEDLE NANO U/F) 32G X 4 MM MISC [id number] Yes Use as directed once a day Carey Chapman, MD  Active   loperamide (IMODIUM) 2 MG capsule [id number] Yes Take 2 mg by mouth daily as needed for diarrhea or loose stools. [provider]  Active            Med Note Alice Innocent, Arthur Billing   Tue Jun 07, 2023  3:34 PM) Takes ~4 days weekly  polyethylene glycol-electrolytes (NULYTELY) 420 g solution [id number]  do 6 oz every 30 mins until the impaction passes, can do 1/2 gallon on first day and 1/2 gallon on 2nd day.  Can continue 2 fleets enemas a day until it resolves.  Patient not taking: Reported on 02/10/2024   Edmonia Gottron, PA-C  Active   rosuvastatin  (CRESTOR ) 10 MG tablet [id number] Yes Take 1 tablet (10 mg total) by mouth daily. McDiarmid, Demetra Filter, MD  Active   triamcinolone  ointment (KENALOG ) 0.5 % [id number] Yes Apply 1 Application topically 2 (two) times daily. McDiarmid, Demetra Filter, MD  Active             Recommendation:   Make sure to attend appointment with Ruthanne Covey D  Follow Up Plan:   Telephone follow up appointment date/time:  03/14/24  11 am  Augustin Leber RN, BSN, [facility]  [facility], [facility]   Care Coordinator Phone: [phone]

## 2024-02-10 NOTE — Patient Instructions (Signed)
 Visit Information  Thank you for taking time to visit with me today. Please don't hesitate to contact me if I can be of assistance to you before our next scheduled appointment.  Your next care management appointment is by telephone on 03/14/24 at 11 am  Please call the care guide team at [phone] if you need to cancel, schedule, or reschedule an appointment.   Please call 1-800-273-TALK (toll free, 24 hour hotline) call 911 if you are experiencing a Mental Health or Behavioral Health Crisis or need someone to talk to.  Augustin Leber RN, BSN, [facility]  [facility], [facility]   Care Coordinator Phone: [phone]

## 2024-02-15 ENCOUNTER — Ambulatory Visit (INDEPENDENT_AMBULATORY_CARE_PROVIDER_SITE_OTHER): Admitting: Ophthalmology

## 2024-02-15 ENCOUNTER — Encounter (INDEPENDENT_AMBULATORY_CARE_PROVIDER_SITE_OTHER): Payer: Self-pay | Admitting: Ophthalmology

## 2024-02-15 DIAGNOSIS — H2513 Age-related nuclear cataract, bilateral: Secondary | ICD-10-CM | POA: Diagnosis not present

## 2024-02-15 DIAGNOSIS — Z794 Long term (current) use of insulin: Secondary | ICD-10-CM | POA: Diagnosis not present

## 2024-02-15 DIAGNOSIS — H25813 Combined forms of age-related cataract, bilateral: Secondary | ICD-10-CM | POA: Diagnosis not present

## 2024-02-15 DIAGNOSIS — H33312 Horseshoe tear of retina without detachment, left eye: Secondary | ICD-10-CM

## 2024-02-15 DIAGNOSIS — E119 Type 2 diabetes mellitus without complications: Secondary | ICD-10-CM | POA: Diagnosis not present

## 2024-02-15 DIAGNOSIS — H33322 Round hole, left eye: Secondary | ICD-10-CM | POA: Diagnosis not present

## 2024-02-15 LAB — HM DIABETES EYE EXAM

## 2024-02-15 NOTE — Progress Notes (Shared)
 [facility] - Clinic Note  02/15/2024   CHIEF COMPLAINT Patient presents for Retina Evaluation  HISTORY OF PRESENT ILLNESS: Matthew Lynch is a 53 y.o. male who presents to the clinic today for:  HPI     Retina Evaluation           Laterality: left eye   Associated Symptoms: Glare         Comments   Patient here for Retina Evaluation. Referred by Dr Marvin Slot. Patient states vision is blurry from time to time. Reading stuff on the phone gets blurry sometimes. Too much light blurry. DX with DM 4 years ago. Not using drops.      Last edited by Sylvan Evener, COA on 02/15/2024  2:50 PM.    Pt is here on the referral of Dr. Diedre Fox for concern of operculated tear OD, pt states he was sent to [facility] for a DM exam, his dr has been wanting him to go for the last 4 years, but this is the first time he's gone, pt states he was dx with diabetes 4 years ago and his A1c was around 13.2, he states it is 9.4 now, he takes insulin  4 times a day, he states that Dr. Candi Chafe saw something in the back of his left eye and sent him over here   Referring physician: Sidra Dredge, MD [address] [place],  [place] [address]  HISTORICAL INFORMATION:  Selected notes from the MEDICAL RECORD NUMBER Referred by Dr. Diedre Fox for concern of operculated tear OS LEE:  Ocular Hx- PMH-   CURRENT MEDICATIONS: No current outpatient medications on file. (Ophthalmic Drugs)   No current facility-administered medications for this visit. (Ophthalmic Drugs)   Current Outpatient Medications (Other)  Medication Sig   Continuous Glucose Sensor (DEXCOM G7 SENSOR) MISC 1 Device by Does not apply route as directed. Apply new sensor every 10 days.   DULoxetine  (CYMBALTA ) 60 MG capsule Take 1 capsule (60 mg total) by mouth 2 (two) times daily. (Patient taking differently: Take 60 mg by mouth 2 (two) times daily. PRN)   feeding supplement, GLUCERNA SHAKE, (GLUCERNA SHAKE) LIQD Take  237 mLs by mouth 3 (three) times daily between meals.   insulin  aspart (FIASP  FLEXTOUCH) 100 UNIT/ML FlexTouch Pen Inject 4-7 Units into the skin 3 (three) times daily before meals.   Insulin  Glargine (BASAGLAR  [place]) 100 UNIT/ML Inject 22 Units into the skin every morning.   Insulin  Pen Needle (BD PEN NEEDLE NANO U/F) 32G X 4 MM MISC Use as directed once a day   loperamide (IMODIUM) 2 MG capsule Take 2 mg by mouth daily as needed for diarrhea or loose stools.   rosuvastatin  (CRESTOR ) 10 MG tablet Take 1 tablet (10 mg total) by mouth daily. (Patient taking differently: Take 10 mg by mouth daily. PRN)   triamcinolone  ointment (KENALOG ) 0.5 % Apply 1 Application topically 2 (two) times daily. (Patient taking differently: Apply 1 Application topically 2 (two) times daily. PRN)   polyethylene glycol-electrolytes (NULYTELY) 420 g solution do 6 oz every 30 mins until the impaction passes, can do 1/2 gallon on first day and 1/2 gallon on 2nd day.  Can continue 2 fleets enemas a day until it resolves. (Patient not taking: Reported on 02/15/2024)   No current facility-administered medications for this visit. (Other)   REVIEW OF SYSTEMS: ROS   Positive for: Endocrine, Eyes Last edited by Sylvan Evener, COA on 02/15/2024  2:50 PM.  ALLERGIES No Known Allergies PAST MEDICAL HISTORY Past Medical History:  Diagnosis Date   Anxiety    Depression    Diabetes mellitus without complication (HCC)    History reviewed. No pertinent surgical history. FAMILY HISTORY Family History  Problem Relation Age of Onset   Diabetes Sister    Colon cancer Maternal Grandfather    Diabetes Maternal Great-grandmother    SOCIAL HISTORY Social History   Tobacco Use   Smoking status: Some Days    Current packs/day: 1.00    Types: Cigarettes    Passive exposure: Current   Smokeless tobacco: Current   Tobacco comments:    Reduced to 1 cigarette PRN high stress 05/2023  Vaping Use   Vaping status: Never  Used  Substance Use Topics   Alcohol use: Never   Drug use: Never       OPHTHALMIC EXAM:  Base Eye Exam     Visual Acuity (Snellen - Linear)       Right Left   Dist [place] 20/20 -1 20/40   Dist ph [place]  20/20         Tonometry (Tonopen, 2:45 PM)       Right Left   Pressure 21 15         Pupils       Dark Light Shape React APD   Right 5 5 Round Slow None   Left 5 5 Round Slow None  Patient is dilated from Dr Noland Battles office.        Visual Fields (Counting fingers)       Left Right    Full Full         Extraocular Movement       Right Left    Full, Ortho Full, Ortho         Neuro/Psych     Oriented x3: Yes   Mood/Affect: Normal         Dilation     Both eyes: 1.0% Mydriacyl, 2.5% Phenylephrine @ 2:45 PM           Slit Lamp and Fundus Exam     Slit Lamp Exam       Right Left   Lids/Lashes Dermatochalasis - upper lid Dermatochalasis - upper lid   Conjunctiva/Sclera mild melanosis mild melanosis   Cornea arcus, tear film debris arcus, tear film debris   Anterior Chamber deep and clear deep and clear   Iris Round and dilated, No NVI Round and dilated, No NVI   Lens 1-2+ Nuclear sclerosis, 2+ Cortical cataract 1-2+ Nuclear sclerosis, 2+ Cortical cataract   Vitreous mild syneresis mild syneresis         Fundus Exam       Right Left   Disc Pink and Sharp, +cupping Pink and Sharp   C/D Ratio 0.6 0.3   Macula Flat, Good foveal reflex, No heme or edema Flat, Good foveal reflex, rare MA, no edema   Vessels attenuated, Tortuous    Periphery Attached, mild WWoP temporally Attached, pigmented operculated tear at 0130           IMAGING AND PROCEDURES  Imaging and Procedures for 02/15/2024  OCT, Retina - OU - Both Eyes       Right Eye Quality was good. Central Foveal Thickness: 233. Progression has no prior data. Findings include normal foveal contour, no IRF, no SRF, vitreomacular adhesion .   Left Eye Quality was good. Central  Foveal Thickness: 237. Progression has no prior data. Findings include normal  foveal contour, no IRF, no SRF.   Notes *Images captured and stored on drive  Diagnosis / Impression:  NFP, no IRF/SRF OU  Clinical management:  See below  Abbreviations: NFP - Normal foveal profile. CME - cystoid macular edema. PED - pigment epithelial detachment. IRF - intraretinal fluid. SRF - subretinal fluid. EZ - ellipsoid zone. ERM - epiretinal membrane. ORA - outer retinal atrophy. ORT - outer retinal tubulation. SRHM - subretinal hyper-reflective material. IRHM - intraretinal hyper-reflective material           ASSESSMENT/PLAN:   ICD-10-CM   1. Retinal tear of left eye  H33.312 OCT, Retina - OU - Both Eyes    2. Diabetes mellitus type 2 without retinopathy (HCC)  E11.9     3. Current use of insulin  (HCC)  Z79.4     4. Combined forms of age-related cataract of both eyes  H25.813      Retinal tear, OS   - The incidence, risk factors, and natural history of retinal tear was discussed with patient.   - Potential treatment options including laser retinopexy and cryotherapy discussed with patient. - pigmented operculated tear at 0130 - recommend laser retinopexy today - RBA of procedure discussed, questions answered - informed consent obtained and signed - see procedure note - start PF QID x7 days - f/u in *** wks  2,3. Diabetes mellitus, type 2 without retinopathy  - A1c: 9.4 on February 09, 2024 - The incidence, risk factors for progression, natural history and treatment options for diabetic retinopathy  were discussed with patient.   - The need for close monitoring of blood glucose, blood pressure, and serum lipids, avoiding cigarette or any type of tobacco, and the need for long term follow up was also discussed with patient. - f/u in 1 year, sooner prn  4. Mixed Cataract OU - The symptoms of cataract, surgical options, and treatments and risks were discussed with patient. - discussed  diagnosis and progression - not yet visually significant - monitor for now    Ophthalmic Meds Ordered this visit:  No orders of the defined types were placed in this encounter.    No follow-ups on file.  There are no Patient Instructions on file for this visit.  Explained the diagnoses, plan, and follow up with the patient and they expressed understanding.  Patient expressed understanding of the importance of proper follow up care.   This document serves as a record of services personally performed by Jeanice Millard, MD, PhD. It was created on their behalf by Morley Arabia. Bevin Bucks, OA an ophthalmic technician. The creation of this record is the provider's dictation and/or activities during the visit.    Electronically signed by: Morley Arabia. Bevin Bucks, OA 02/15/24 4:02 PM   Jeanice Millard, M.D., Ph.D. Diseases & Surgery of the Retina and Vitreous [facility] 02/15/2024  Abbreviations: M myopia (nearsighted); A astigmatism; H hyperopia (farsighted); P presbyopia; Mrx spectacle prescription;  CTL contact lenses; OD right eye; OS left eye; OU both eyes  XT exotropia; ET esotropia; PEK punctate epithelial keratitis; PEE punctate epithelial erosions; DES dry eye syndrome; MGD meibomian gland dysfunction; ATs artificial tears; PFAT's preservative free artificial tears; NSC nuclear sclerotic cataract; PSC posterior subcapsular cataract; ERM epi-retinal membrane; PVD posterior vitreous detachment; RD retinal detachment; DM diabetes mellitus; DR diabetic retinopathy; NPDR non-proliferative diabetic retinopathy; PDR proliferative diabetic retinopathy; CSME clinically significant macular edema; DME diabetic macular edema; dbh dot blot hemorrhages; CWS cotton wool spot; POAG primary  open angle glaucoma; C/D cup-to-disc ratio; HVF humphrey visual field; GVF goldmann visual field; OCT optical coherence tomography; IOP intraocular pressure; BRVO Branch retinal vein occlusion; CRVO central  retinal vein occlusion; CRAO central retinal artery occlusion; BRAO branch retinal artery occlusion; RT retinal tear; SB scleral buckle; PPV pars plana vitrectomy; VH Vitreous hemorrhage; PRP panretinal laser photocoagulation; IVK intravitreal kenalog ; VMT vitreomacular traction; MH Macular hole;  NVD neovascularization of the disc; NVE neovascularization elsewhere; AREDS age related eye disease study; ARMD age related macular degeneration; POAG primary open angle glaucoma; EBMD epithelial/anterior basement membrane dystrophy; ACIOL anterior chamber intraocular lens; IOL intraocular lens; PCIOL posterior chamber intraocular lens; Phaco/IOL phacoemulsification with intraocular lens placement; PRK photorefractive keratectomy; LASIK laser assisted in situ keratomileusis; HTN hypertension; DM diabetes mellitus; COPD chronic obstructive pulmonary disease

## 2024-02-16 ENCOUNTER — Encounter (INDEPENDENT_AMBULATORY_CARE_PROVIDER_SITE_OTHER): Admitting: Ophthalmology

## 2024-02-19 ENCOUNTER — Encounter (INDEPENDENT_AMBULATORY_CARE_PROVIDER_SITE_OTHER): Payer: Self-pay | Admitting: Ophthalmology

## 2024-02-21 ENCOUNTER — Encounter: Payer: Self-pay | Admitting: Family Medicine

## 2024-02-21 DIAGNOSIS — H33312 Horseshoe tear of retina without detachment, left eye: Secondary | ICD-10-CM | POA: Insufficient documentation

## 2024-02-22 ENCOUNTER — Ambulatory Visit: Admitting: Pharmacist

## 2024-02-22 ENCOUNTER — Encounter: Payer: Self-pay | Admitting: Pharmacist

## 2024-02-22 VITALS — BP 105/66 | HR 82 | Wt 111.8 lb

## 2024-02-22 DIAGNOSIS — Z720 Tobacco use: Secondary | ICD-10-CM | POA: Diagnosis not present

## 2024-02-22 DIAGNOSIS — E785 Hyperlipidemia, unspecified: Secondary | ICD-10-CM | POA: Diagnosis not present

## 2024-02-22 DIAGNOSIS — Z794 Long term (current) use of insulin: Secondary | ICD-10-CM

## 2024-02-22 DIAGNOSIS — E1140 Type 2 diabetes mellitus with diabetic neuropathy, unspecified: Secondary | ICD-10-CM

## 2024-02-22 DIAGNOSIS — E1165 Type 2 diabetes mellitus with hyperglycemia: Secondary | ICD-10-CM

## 2024-02-22 MED ORDER — ROSUVASTATIN CALCIUM 10 MG PO TABS: 10.0000 mg | ORAL_TABLET | Freq: Every day | ORAL | 11 refills | Status: DC

## 2024-02-22 MED ORDER — BASAGLAR KWIKPEN 100 UNIT/ML ~~LOC~~ SOPN: 20.0000 [IU] | PEN_INJECTOR | Freq: Every day | SUBCUTANEOUS | 3 refills | Status: DC

## 2024-02-22 MED ORDER — DULOXETINE HCL 60 MG PO CPEP: 60.0000 mg | ORAL_CAPSULE | Freq: Two times a day (BID) | ORAL | 1 refills | Status: DC

## 2024-02-22 MED ORDER — FIASP FLEXTOUCH 100 UNIT/ML ~~LOC~~ SOPN: 5.0000 [IU] | PEN_INJECTOR | Freq: Three times a day (TID) | SUBCUTANEOUS | Status: DC

## 2024-02-22 NOTE — Assessment & Plan Note (Signed)
 Tobacco Use Disorder - Increased frequency/amount of smoking due to stress, now smoking ~ 10 cigarettes per day. Highly concerned about a potential return to jail for failing to pay child support.  -No goal set for intake reduction

## 2024-02-22 NOTE — Progress Notes (Signed)
 S:     Chief Complaint  Patient presents with   Medication Management    Diabetes - Lipid Therapy - tobacco intake reduction   53 y.o. male who presents for diabetes evaluation, education, and management. Patient arrives in good spirits and presents without any assistance.   Patient was referred and last seen by Primary Care Provider, Dr. Diona, on 02/09/2024.  At last visit, diabetes control was worsened to a level of 9.4   PMH is significant for .  Patient reports Diabetes was diagnosed in 2021.   Family/Social History: reports fear of being sent back to jail for failing to pay child support  Requests visit with PCP Dr. Nicholas for letter supporting him in his court case 03/07/2024  Current diabetes medications include: insulin  Fiasp  (insulin  aspart) 4-7 units TID with meals, insulin  Basaglar  (insulin  glargine) 22 units once daily Current hypertension medications include: None Current hyperlipidemia medications include: denies taking rosuvastatin  10 mg once daily   Do you have any problems obtaining medications due to transportation or finances? yes  Patient reports hypoglycemic events when food is scarce.  Reports smoking 1/2 ppd currently due to stress.    Within the past 12 months, did you worry whether your food would run out before you got money to buy more? yes   O:   Review of Systems  Constitutional:  Positive for malaise/fatigue.  Psychiatric/Behavioral:  The patient is nervous/anxious.   All other systems reviewed and are negative.   Physical Exam Vitals reviewed.  Constitutional:      Appearance: Normal appearance.  Pulmonary:     Effort: Pulmonary effort is normal.   Neurological:     Mental Status: He is alert.   Psychiatric:        Mood and Affect: Mood normal.        Thought Content: Thought content normal.        Judgment: Judgment normal.    Dexcom CGM Download today 02/22/2024 % Time CGM is active: 76% Average Glucose: 216 mg/dL Glucose  Management Indicator: 8.5  Glucose Variability: 37.7% (goal <36%) Time in Goal:  - Time in range 70-180: 31% - Time above range: 66% - Time below range: 3% Observed patterns:  Lab Results  Component Value Date   HGBA1C 9.4 (A) 02/09/2024   Vitals:   02/22/24 1014  BP: 105/66  Pulse: 82  SpO2: 99%    Lipid Panel     Component Value Date/Time   CHOL 172 06/10/2023 1607   TRIG 100 06/10/2023 1607   HDL 62 06/10/2023 1607   CHOLHDL 2.8 06/10/2023 1607   CHOLHDL 5.2 01/25/2020 1308   VLDL 54 (H) 01/25/2020 1308   LDLCALC 92 06/10/2023 1607    Patient is participating in a Managed Medicaid Plan:  Yes   A/P: Diabetes since 2021 recently worsened control, but improving. Patient is able to verbalize appropriate hypoglycemia management plan. Medication insulin  adherence appears good. Control is suboptimal due to various life stressors. -Increased Fiasp  (insulin  aspart) from 4-7 units TID with meals to 5-8units TID with meals -Reduced Basaglar  (insulin  glargine) from 22 to 20 units once daily -Patient educated on purpose, proper use, and potential adverse effects.  -Extensively discussed pathophysiology of diabetes, recommended lifestyle interventions, dietary effects on blood sugar control.  -Counseled on s/sx of and management of hypoglycemia.    ASCVD risk - primary prevention in patient with diabetes. Patient has been nonadherent to rosuvastatin  since last visit with me in 07/2023.  Re-discussed the  importance of this medication.  Following discussion patient willing to restart.  -Restarted rosuvastatin  10 mg once daily and encouraged adherence  Tobacco Use Disorder - Increased frequency/amount of smoking due to stress, now smoking ~ 10 cigarettes per day. Highly concerned about a potential return to jail for failing to pay child support.  -No goal set for intake reduction   Refilled duloxetine  for peripheral neuropathy - appears stable at this time with current tx.    Written patient instructions provided. Patient verbalized understanding of treatment plan.  Total time in face to face counseling 28 minutes.    Follow-up:  Pharmacist PRN PCP clinic visit tomorrow for patient in need of letter from PCP for court to avoid jail.

## 2024-02-22 NOTE — Assessment & Plan Note (Signed)
 Diabetes since 2021 recently worsened control, but improving. Patient is able to verbalize appropriate hypoglycemia management plan. Medication insulin  adherence appears good. Control is suboptimal due to various life stressors. -Increased Fiasp  (insulin  aspart) from 4-7 units TID with meals to 5-8units TID with meals -Reduced Basaglar  (insulin  glargine) from 22 to 20 units once daily -Patient educated on purpose, proper use, and potential adverse effects.  -Extensively discussed pathophysiology of diabetes, recommended lifestyle interventions, dietary effects on blood sugar control.  -Counseled on s/sx of and management of hypoglycemia.

## 2024-02-22 NOTE — Patient Instructions (Signed)
 It was nice to see you today!  Your goal blood sugar is 80-130 before eating and less than 180 after eating.  Keep up the good work adjusting meal time insulin .   Medication Changes:  Use 5-8 units of Fiasp  (meal time) insulin  with each meal 2-3 times per day  Decrease Basaglar  from 22 to 20 units once daily  REstart  Rosuvastatin   Continue Duloxetine    Keep up the good work with diet and exercise. Aim for a diet full of vegetables, fruit and lean meats (chicken, [place], fish). Try to limit salt intake by eating fresh or frozen vegetables (instead of canned), rinse canned vegetables prior to cooking and do not add any additional salt to meals.

## 2024-02-22 NOTE — Assessment & Plan Note (Signed)
 ASCVD risk - primary prevention in patient with diabetes. Patient has been nonadherent to rosuvastatin  since last visit with me in 07/2023.  Re-discussed the importance of this medication.  Following discussion patient willing to restart.  -Restarted rosuvastatin  10 mg once daily and encouraged adherence

## 2024-02-23 ENCOUNTER — Ambulatory Visit (INDEPENDENT_AMBULATORY_CARE_PROVIDER_SITE_OTHER): Admitting: Family Medicine

## 2024-02-23 ENCOUNTER — Telehealth: Payer: Self-pay | Admitting: Family Medicine

## 2024-02-23 VITALS — BP 122/84 | HR 98 | Wt 110.8 lb

## 2024-02-23 DIAGNOSIS — N50812 Left testicular pain: Secondary | ICD-10-CM | POA: Diagnosis not present

## 2024-02-23 DIAGNOSIS — N4889 Other specified disorders of penis: Secondary | ICD-10-CM | POA: Diagnosis not present

## 2024-02-23 MED ORDER — LEVOFLOXACIN 500 MG PO TABS: 500.0000 mg | ORAL_TABLET | Freq: Every day | ORAL | 0 refills | Status: DC

## 2024-02-23 NOTE — Addendum Note (Signed)
 Addended by: Alondria Mousseau on: 02/23/2024 05:25 PM   Modules accepted: Orders

## 2024-02-23 NOTE — Telephone Encounter (Signed)
 Attempted to call patient x2 for each phone number in the chart. Patient did not answer and did not have voicemail box set up. Wanted to tell patient that we have sent antibiotics to empirically treat epididymitis, but will inform him of the urine results when those become available as well.

## 2024-02-23 NOTE — Patient Instructions (Signed)
 It was wonderful to see you today.  Please bring ALL of your medications with you to every visit.   Today we talked about:  Disability letter - I have written and offered a new letter outlining your ability to work. Please contact your legal team and ask if there is any additional disability paperwork you need me to fill out.   Please ask your urologist to send us  records from your last 6 months of visits.   Please follow up in 1 month   Thank you for choosing [facility].   Please call [phone] with any questions about today's appointment.  Please be sure to schedule follow up at the front desk before you leave today.   Areta Saliva, MD  Family Medicine

## 2024-02-23 NOTE — Progress Notes (Signed)
    SUBJECTIVE:   CHIEF COMPLAINT / HPI:   Letter regarding diabetes  Patient requests a letter outlining that he has diabetes that has caused him to have neuropathy and other issues that make it difficult to work. Patient has received a letter of this sort from me before and would like one with current date stating that this condition and treatment is on going. Patient also has disability claim pending with appeal. He had been previously referred for legal aid and is working with them for this.   Testicular pain  Patient mentions that he is having pain at the posterior portion of his left testicle at the part on top of the testicle. He has noticed this for a couple days now; however, it is difficult for him to say a timeline as he has a prolonged history of penile and pelvic pain/parasthesias that is ongoing. He has been to urology and recently had cystoscopy. Unfortunately the notes from his most recent visit and cystoscopy records have not been sent over. He does note that he does not have as frequent urinary incontinence as he once did and he no longer has any fecal incontinence. Denies any frank pus, fever, or other lesions. No dysuria. He is not sexually active at this time.    PERTINENT  PMH / PSH: T2DM, Neuropathy, tobacco use   OBJECTIVE:   BP 122/84   Pulse 98   Wt 110 lb 12.8 oz (50.3 kg)   SpO2 100%   BMI 18.44 kg/m   General: well appearing, in no acute distress, sitting leaning back in chair to avoid putting more pressure on testicle  CV: RRR, no lower extremity edema  Resp: Normal work of breathing on room air  GU (chaperoned by CMA): No lesions on penis or testicles, right testicle without pain, normal contours, left testicle normal size, painful to palpation of the epididymis which seems slightly edematous, slight relief of pain with elevation of left testicle  DRE no pain on palpation of prostate.   ASSESSMENT/PLAN:   Assessment & Plan Testicular pain, left Exam  seems to be consistent with epididymitis. Difficult to ascertain as patient has had penile and pelvic discomfort for some time. However, these symptoms seem to be more new and different from his norm. Given age and relatively recent instrumentation will prescribe levofloxacin.  - Urine culture, Urine GC/CT  - levofloxacin 500 mg daily for 10 days     Letter  Letter written outlining that patient has neuropathy that makes it difficult for him to stand for long periods of time. Also mentioned that patient requires insulin  and close monitoring of blood glucose and that patient is continuing to require frequent doctors visits to evaluate and treat his condition. Encouraged patient to reach out to legal team and ask if there are specific forms that need to be filled out.   Areta Saliva, MD [facility]

## 2024-02-25 LAB — URINE CULTURE: Organism ID, Bacteria: NO GROWTH

## 2024-02-27 NOTE — Progress Notes (Signed)
 Reviewed and agree with Dr Macky Lower plan.

## 2024-03-14 ENCOUNTER — Other Ambulatory Visit: Payer: Self-pay

## 2024-03-14 NOTE — Patient Outreach (Signed)
 Complex Care Management   Visit Note  03/14/2024  Name:  Matthew Lynch MRN: [id number] DOB: February 21, 1971  Situation: Referral received for Complex Care Management related to Diabetes with Complications I obtained verbal consent from Patient.  Visit completed with patient  on the phone  Background:   Past Medical History:  Diagnosis Date   Anxiety    Depression    Diabetes mellitus without complication (HCC)     Assessment: Patient Reported Symptoms:  Cognitive Cognitive Status: Able to follow simple commands, Alert and oriented to person, place, and time, Normal speech and language skills      Neurological Neurological Review of Symptoms: No symptoms reported    HEENT HEENT Symptoms Reported: No symptoms reported      Cardiovascular Cardiovascular Symptoms Reported: No symptoms reported    Respiratory Respiratory Symptoms Reported: No symptoms reported    Endocrine Is patient diabetic?: Yes Is patient checking blood sugars at home?: Yes List most recent blood sugar readings, include date and time of day: 99 this mo0rning 134 1107 am    Gastrointestinal Gastrointestinal Symptoms Reported: No symptoms reported      Genitourinary Genitourinary Symptoms Reported: No symptoms reported    Integumentary Integumentary Symptoms Reported: Rash Other Integumentary Symptoms: he states that the rash comes and goes    Musculoskeletal Musculoskelatal Symptoms Reviewed: No symptoms reported   Falls in the past year?: No    Psychosocial       Quality of Family Relationships: supportive Do you feel physically threatened by others?: No      03/14/2024   11:23 AM  Depression screen PHQ 2/9  Decreased Interest 2  Down, Depressed, Hopeless 2  PHQ - 2 Score 4  Altered sleeping 3  Tired, decreased energy 3  Change in appetite 3  Feeling bad or failure about yourself  3  Trouble concentrating 3  Moving slowly or fidgety/restless 2  Suicidal thoughts 0  PHQ-9 Score 21     There were no vitals filed for this visit.  Medications Reviewed Today     Reviewed by Weyman Corning, RN (Registered Nurse) on 03/14/24 at 1116  Med List Status: <None>   Medication Order Taking? Sig Documenting Provider Last Dose Status Informant  Continuous Glucose Sensor (DEXCOM G7 SENSOR) MISC [id number] Yes 1 Device by Does not apply route as directed. Apply new sensor every 10 days. Nicholas Bar, MD  Active   DULoxetine  (CYMBALTA ) 60 MG capsule [id number] Yes Take 1 capsule (60 mg total) by mouth 2 (two) times daily. McDiarmid, Krystal BIRCH, MD  Active   feeding supplement, GLUCERNA SHAKE, (GLUCERNA SHAKE) LIQD [id number]  Take 237 mLs by mouth 3 (three) times daily between meals.  Patient not taking: Reported on 03/14/2024   Nicholas Bar, MD  Active   insulin  aspart (FIASP  FLEXTOUCH) 100 UNIT/ML FlexTouch Pen [id number] Yes Inject 5-8 Units into the skin 3 (three) times daily before meals. McDiarmid, Krystal BIRCH, MD  Active   Insulin  Glargine (BASAGLAR  KWIKPEN) 100 UNIT/ML [id number] Yes Inject 20 Units into the skin daily. McDiarmid, Krystal BIRCH, MD  Active   Insulin  Pen Needle (BD PEN NEEDLE NANO U/F) 32G X 4 MM MISC [id number] Yes Use as directed once a day Diona Perkins, MD  Active   levofloxacin  (LEVAQUIN ) 500 MG tablet [id number] Yes Take 1 tablet (500 mg total) by mouth daily. Nicholas Bar, MD  Active   loperamide (IMODIUM) 2 MG capsule [id number] Yes Take 2 mg by mouth daily as needed for diarrhea or loose stools.  [provider]  Active            Med Note MAUDIE, MAUDE MATSU   Tue Jun 07, 2023  3:34 PM) Takes ~4 days weekly  polyethylene glycol-electrolytes (NULYTELY) 420 g solution [id number]  do 6 oz every 30 mins until the impaction passes, can do 1/2 gallon on first day and 1/2 gallon on 2nd day.  Can continue 2 fleets enemas a day until it resolves.  Patient not taking: Reported on 03/14/2024   Craig Alan SAUNDERS, PA-C  Active   rosuvastatin  (CRESTOR ) 10 MG tablet [id number] Yes Take 1  tablet (10 mg total) by mouth daily. McDiarmid, Krystal BIRCH, MD  Active   triamcinolone  ointment (KENALOG ) 0.5 % [id number] Yes Apply 1 Application topically 2 (two) times daily. McDiarmid, Krystal BIRCH, MD  Active             Recommendation:   PCP Follow-up  Follow Up Plan:   Telephone follow up appointment with care management team member scheduled for:  8/18//25  10 am Olam Ku RNCM  Wilbert Diver RN, BSN, [facility], [facility]    Care Coordinator Phone: [phone]

## 2024-03-14 NOTE — Patient Instructions (Signed)
 Visit Information  [name] was given information about Medicaid Managed Care team care coordination services as a part of their Healthy [facility] Medicaid benefit. Matthew Lynch verbally consented to engagement with the [facility] Managed Care team.   If you are experiencing a medical emergency, please call 911 or report to your local emergency department or urgent care.   If you have a non-emergency medical problem during routine business hours, please contact your provider's office and ask to speak with a nurse.   For questions related to your Healthy [facility] health plan, please call: [phone] or visit the homepage here: [email or web address]  If you would like to schedule transportation through your Healthy [facility] plan, please call the following number at least 2 days in advance of your appointment: [phone]  For information about your ride after you set it up, call Ride Assist at [phone]. Use this number to activate a Will Call pickup, or if your transportation is late for a scheduled pickup. Use this number, too, if you need to make a change or cancel a previously scheduled reservation.  If you need transportation services right away, call [phone]. The after-hours call center is staffed 24 hours to handle ride assistance and urgent reservation requests (including discharges) 365 days a year. Urgent trips include sick visits, hospital discharge requests and life-sustaining treatment.  Call the [facility] Line at [phone], at any time, 24 hours a day, 7 days a week. If you are in danger or need immediate medical attention call 911.  If you would like help to quit smoking, call 1-800-QUIT-NOW (336-053-1111) OR Espaol: 1-855-Djelo-Ya (8-144-664-6430) o para ms informacin haga clic aqu or Text READY to 799-599 to register via text  Matthew Lynch - following are the goals we discussed in your visit today:    Goals Addressed             This Visit's Progress    VBCI RN Care Plan- DM II         Problems:  Chronic Disease Management support and education needs related to DMII  Goal: Over the next 60 days the Patient will attend all scheduled medical appointments: with PCP and specialist as evidenced by keeping al scheduled appointments        demonstrate Ongoing adherence to prescribed treatment plan for DM II as evidenced by no admissions to the hospital verbalize basic understanding of DM II disease process and self health management plan as evidenced by verbal explanation lifestyle changes and consistent medication compliance   Interventions:   Diabetes Interventions: Assessed patient's understanding of A1c goal: <7% Reviewed medications with patient and discussed importance of medication adherence Counseled on importance of regular laboratory monitoring as prescribed Discussed plans with patient for ongoing care management follow up and provided patient with direct contact information for care management team Review of patient status, including review of consultants reports, relevant laboratory and other test results, and medications completed Screening for signs and symptoms of depression related to chronic disease state  Assessed social determinant of health barriers Take medications as prescribed Continue to eat healthy meals Check blood sugars regularly Follow up with Matthew Lynch at appointment to discuss lows Check with your insurance to make sure to get all of the benefits you are afforded at the numbers I gave you Ride Assist at [phone] and [phone] for eye and dental benefits.  Lab Results  Component Value Date   HGBA1C 9.4 (A) 02/09/2024    Patient Self-Care Activities:  Attend all  scheduled provider appointments Call pharmacy for medication refills 3-7 days in advance of running out of medications Call provider office for new concerns or questions  Perform all  self care activities independently  Take medications as prescribed   keep appointment with eye doctor check blood sugar at prescribed times: three times daily  Plan:  Telephone follow up appointment with care management team member scheduled for:  8/18//25  10 am Matthew Lynch             Please see education materials related to Diabetes provided by MyChart link.  Patient verbalizes understanding of instructions and care plan provided today and agrees to view in MyChart. Active MyChart status and patient understanding of how to access instructions and care plan via MyChart confirmed with patient.     Telephone follow up appointment with care management team member scheduled for:  8/18//25  10 am Matthew Lynch  Matthew Diver RN, BSN, [facility]  [facility], [facility]    Care Coordinator Phone: [phone]      Following is a copy of your plan of care:  There are no care plans that you recently modified to display for this patient.

## 2024-03-23 ENCOUNTER — Encounter: Payer: Self-pay | Admitting: Family Medicine

## 2024-03-23 ENCOUNTER — Ambulatory Visit (INDEPENDENT_AMBULATORY_CARE_PROVIDER_SITE_OTHER): Admitting: Family Medicine

## 2024-03-23 VITALS — BP 119/79 | HR 91 | Ht 65.0 in | Wt 111.4 lb

## 2024-03-23 DIAGNOSIS — E1100 Type 2 diabetes mellitus with hyperosmolarity without nonketotic hyperglycemic-hyperosmolar coma (NKHHC): Secondary | ICD-10-CM

## 2024-03-23 DIAGNOSIS — Z794 Long term (current) use of insulin: Secondary | ICD-10-CM

## 2024-03-23 DIAGNOSIS — E1165 Type 2 diabetes mellitus with hyperglycemia: Secondary | ICD-10-CM

## 2024-03-23 DIAGNOSIS — R102 Pelvic and perineal pain: Secondary | ICD-10-CM

## 2024-03-23 DIAGNOSIS — R45851 Suicidal ideations: Secondary | ICD-10-CM | POA: Diagnosis not present

## 2024-03-23 DIAGNOSIS — Z609 Problem related to social environment, unspecified: Secondary | ICD-10-CM

## 2024-03-23 MED ORDER — DEXCOM G7 SENSOR MISC: 1.0000 | 11 refills | Status: AC

## 2024-03-23 NOTE — Progress Notes (Cosign Needed Addendum)
 SUBJECTIVE:   CHIEF COMPLAINT / HPI:   Pelvic discomfort Patient says pain is even worse now. He has pain from the back of the testicle going to his rectum.  Patient was previously seen for pain in his testicle and was prescribed empiric antibiotics for epididymitis.  The urine culture was negative.  Patient did not finish antibiotics until recently because he was recently in jail.  Denies fevers, incontinence.  However patient endorses recent worsening of constipation and having harder stools that he has to strain to evacuate.  Says this discomfort in his pelvis is different than his neuropathic pain.  Denies nausea or vomiting.  Discomfort is constant.  Has [facility] with [facility].  Was unable to get cystoscopy report.  Patient was going to call to have the report sent however difficult with his recent situation.  Difficult social situation Patient is disabled due to significant morbidity from his diabetes.  He has significant neuropathy and other problems.  This makes it difficult for patient to work.  As he is not working he is not able to pay child support and has been placed in prison multiple times for this reason.  Most recently was placed in prison in June and recently was released.  While in prison patient was initially not given insulin  and was only given metformin  later was given insulin  but according the patient was not told what dosages he was being given.  Patient reports that he had episodes of hypoglycemic symptoms.  For this reason he questioned his insulin  dosages however was placed in psychiatric solitary due to thought of patient being suicidal since he was refusing insulin .  Patient said this felt like his dignity was being taken away.  He is very frustrated with his situation of his health and not being able to work as well as cycle of going to prison and being released.  He does have contact with [employer] and is trying to appeal his disability denial.  Currently he is  living with his mother but tells me that he will have to leave her in a few days and will most likely be homeless. Patient denies any active SI at this time and says multiple times that he wants to live wants to get better for his 2 children.  PERTINENT  PMH / PSH: T2DM, HLD, tobacco use   OBJECTIVE:   BP 119/79   Pulse 91   Ht 5' 5 (1.651 m)   Wt 111 lb 6.4 oz (50.5 kg)   SpO2 100%   BMI 18.54 kg/m   General: well appearing, in no acute distress CV: Well-perfused Resp: Normal work of breathing on room air Neuro: Alert & Oriented x 4 Psych: Tearful while discussing social situation, good insight, good judgment and organized thought process   ASSESSMENT/PLAN:   Assessment & Plan Pelvic pain in male Unlikely that patient has epididymitis given negative urine culture.  Cystoscopy was normal in the past.  However patient has not been able to follow-up with urology since then.  Most likely the pelvic discomfort is due to constipation and pressure given his recent worsening of constipation previously when seen by GI noted that patient has had lots of hard stool in the rectum.  Could also be due to diabetic neuropathy. - Provided patient with phone number for [facility] and recommended follow-up with them - Recommended MiraLAX daily to treat constipation - Continue Cymbalta  for neuropathy - Follow-up in 1 month Poor social situation Patient has lots of  poor socioeconomic determinants of health.  Has [facility] Child psychotherapist already.  Is currently working with [employer].  I have written him a letter describing his disability and condition and reasoning for why insulin  is lifesaving and a need for him. - Will reach out to social worker regarding patient's needs - Provided food from the clinic food pantry - Not actively suicidal and poor mood is solely due to his poor social situation at this time - Follow-up in 1 month Passive suicidal ideations Patient with + question 9 on phq 9. SI is  passive. He has history of depression and is being treated with Cymbalta .  His passive SI is mainly related to his social situation.  He repeated many times that he does not want to die and wants to live especially for his children.  He also wants to improve his health.  He is very motivated to improve his situation.  He feels supported.  He does endorse that he would reach out if symptoms worsened. - Will address social situation as above - Follow-up in 1 month - Continue Cymbalta      Areta Saliva, MD [facility]

## 2024-03-23 NOTE — Patient Instructions (Addendum)
 It was wonderful to see you today.  Please bring ALL of your medications with you to every visit.   Today we talked about:  Pelvic fullness - Please try taking miralax daily to see if this can relieve the hard stool and help with the pelvic fullness. Please also call your Urologist for a follow up appointment. They are called [facility] [phone].   I have scheduled you an appointment with Dr. Koval to work on your insulin  dosage. I have sent the sensor refill to the pharmacy.   I have sent in a social work referral to help with housing and some other resources.   Thank you for choosing [facility].   Please call [phone] with any questions about today's appointment.  Areta Saliva, MD  Family Medicine

## 2024-04-02 ENCOUNTER — Encounter: Payer: Self-pay | Admitting: Pharmacist

## 2024-04-02 ENCOUNTER — Ambulatory Visit (INDEPENDENT_AMBULATORY_CARE_PROVIDER_SITE_OTHER): Admitting: Pharmacist

## 2024-04-02 VITALS — BP 120/73 | HR 87 | Wt 117.8 lb

## 2024-04-02 DIAGNOSIS — Z720 Tobacco use: Secondary | ICD-10-CM

## 2024-04-02 DIAGNOSIS — E1165 Type 2 diabetes mellitus with hyperglycemia: Secondary | ICD-10-CM | POA: Diagnosis not present

## 2024-04-02 DIAGNOSIS — Z794 Long term (current) use of insulin: Secondary | ICD-10-CM

## 2024-04-02 MED ORDER — FIASP FLEXTOUCH 100 UNIT/ML ~~LOC~~ SOPN: 4.0000 [IU] | PEN_INJECTOR | Freq: Three times a day (TID) | SUBCUTANEOUS | Status: DC

## 2024-04-02 MED ORDER — BASAGLAR KWIKPEN 100 UNIT/ML ~~LOC~~ SOPN: 18.0000 [IU] | PEN_INJECTOR | Freq: Every day | SUBCUTANEOUS | Status: DC

## 2024-04-02 NOTE — Assessment & Plan Note (Signed)
 Tobacco Use Disorder - complete cessation reported since time spent in jail and new focus on trying to do everything to become more accountable - Reports 10/10 commitment to long-term cessation confidence. - Congratulated on success and encouraged continued abstinence.

## 2024-04-02 NOTE — Progress Notes (Signed)
 S:     Chief Complaint  Patient presents with   Medication Management    Diabetes - CGM   53 y.o. male who presents for diabetes evaluation, education, and management. Patient arrives in very good spirits and presents without any assistance. Patient reports a new level of commitment to his efforts to improve his situation and take care of his family.  He reports his jail time helped him to refocus his efforts.  He shared that he has quit smoking and is committed to remaining quit with 10/10 confidence in remaining a long-term quit.   Patient was referred and last seen by Primary Care Provider, Dr. Nicholas, on 03/23/2024.   Patient reports Diabetes was diagnosed in 2021.   Family/Social History: Risk of homelessness - lacks permanent home/resources.  Current diabetes medications include: insulin  Fiasp  (insulin  aspart) 4-6 units TID with meals, insulin  Basaglar  (insulin  glargine) 20 units once daily Current hypertension medications include: None Current hyperlipidemia medications include: denies taking rosuvastatin  10 mg once daily  Patient reports adherence to taking all medications as prescribed.   Patient reports  neuropathy (nerve pain) has been less and improved. Notes that elevated glucose appears to be associated with worse pain control.    Patient reported dietary habits: Eats 3 meals/day More fruits and less meats. Drinks: Atkins protein drink   O:   Review of Systems  Constitutional:  Negative for weight loss.    Physical Exam Constitutional:      General: Distressed: gained 6 lbs since last assessment.     Appearance: He is normal weight.  Neurological:     Mental Status: He is alert.     DEXCOM CGM Download today 04/02/2024 % Time CGM is active: 97% Average Glucose: 179 mg/dL Glucose Management Indicator: 7.6  Glucose Variability: 35.9% (goal <36%) Time in Goal:  - Time in range 70-180: 50% - Time above range: 47% - Time below range: 3% Observed  patterns:  Lab Results  Component Value Date   HGBA1C 9.4 (A) 02/09/2024   Vitals:   04/02/24 1109  BP: 120/73  Pulse: 87  SpO2: 100%    Lipid Panel     Component Value Date/Time   CHOL 172 06/10/2023 1607   TRIG 100 06/10/2023 1607   HDL 62 06/10/2023 1607   CHOLHDL 2.8 06/10/2023 1607   CHOLHDL 5.2 01/25/2020 1308   VLDL 54 (H) 01/25/2020 1308   LDLCALC 92 06/10/2023 1607    A/P: Diabetes since 2021 recently improved control with tremendous transition to accountability to improving . Patient is able to verbalize appropriate hypoglycemia management plan. Medication insulin  adherence appears good. Variability remains a challenge to improved control.  Continues to have low readings overnight.  Improved commitment to dietary intake.  Gained 6 pls in weight and patient reported energy increase.   -Increased Fiasp  (insulin  aspart) from 4-8 units TID with meals and encouraged to use more meals with 5 units -Reduced Basaglar  (insulin  glargine) from 20 to 18 units once daily -Patient educated on purpose, proper use, and potential adverse effects.  -Extensively discussed pathophysiology of diabetes, recommended lifestyle interventions, dietary effects on blood sugar control.  -Counseled on s/sx of and management of hypoglycemia.  Tobacco Use Disorder - complete cessation reported since time spent in jail and new focus on trying to do everything to become more accountable - Reports 10/10 commitment to long-term cessation confidence. - Congratulated on success and encouraged continued abstinence.    Written patient instructions provided. Patient verbalized understanding of  treatment plan.  Total time in face to face counseling 28 minutes.    Follow-up:  Pharmacist PRN - TBD PCP clinic visit, [place], 05/01/2024 Patient seen with Dr. Tharon, MD, Fonda Blase, PharmD Candidate - PY3 student and Calton Nash, PharmD Candidate - PY4 student.

## 2024-04-02 NOTE — Patient Instructions (Signed)
 It was nice to see you today!  GREAT job on quitting smoking!  Your goal blood sugar is 80-130 before eating and less than 180 after eating.  Medication Changes: Decrease Basaglar  (insulin  glargine)  18 units once per day.   Use your Fiasp  (insulin  aspart)  4- 8 units prior to meals (try to use more 5 units if your glucose is higher than than 150)  Continue all other medication the same.   Keep up the good work with diet and exercise. Aim for a diet full of vegetables, fruit and lean meats (chicken, [place], fish). Try to limit salt intake by eating fresh or frozen vegetables (instead of canned), rinse canned vegetables prior to cooking and do not add any additional salt to meals.

## 2024-04-02 NOTE — Assessment & Plan Note (Signed)
 Diabetes since 2021 recently improved control with tremendous transition to accountability to improving . Patient is able to verbalize appropriate hypoglycemia management plan. Medication insulin  adherence appears good. Variability remains a challenge to improved control.  Continues to have low readings overnight.  Improved commitment to dietary intake.  Gained 6 pls in weight and patient reported energy increase.   -Increased Fiasp  (insulin  aspart) from 4-8 units TID with meals and encouraged to use more meals with 5 units -Reduced Basaglar  (insulin  glargine) from 20 to 18 units once daily -Patient educated on purpose, proper use, and potential adverse effects.  -Extensively discussed pathophysiology of diabetes, recommended lifestyle interventions, dietary effects on blood sugar control.  -Counseled on s/sx of and management of hypoglycemia.

## 2024-04-03 NOTE — Progress Notes (Signed)
 Reviewed and agree with Dr Macky Lower plan.

## 2024-04-09 ENCOUNTER — Telehealth: Payer: Self-pay | Admitting: *Deleted

## 2024-04-09 DIAGNOSIS — F339 Major depressive disorder, recurrent, unspecified: Secondary | ICD-10-CM

## 2024-04-09 DIAGNOSIS — E1140 Type 2 diabetes mellitus with diabetic neuropathy, unspecified: Secondary | ICD-10-CM

## 2024-04-09 NOTE — Progress Notes (Signed)
 Complex Care Management Note  Care Guide Note 04/09/2024 Name: Matthew Lynch MRN: [id number] DOB: 20-Nov-1970  Matthew Lynch is a 53 y.o. year old male who sees Nicholas Bar, MD for primary care. I reached out to [name] by phone today to offer complex care management services.  Matthew Lynch was given information about Complex Care Management services today including:   The Complex Care Management services include support from the care team which includes your Nurse Care Manager, Clinical Social Worker, or Pharmacist.  The Complex Care Management team is here to help remove barriers to the health concerns and goals most important to you. Complex Care Management services are voluntary, and the patient may decline or stop services at any time by request to their care team member.   Complex Care Management Consent Status: Patient agreed to services and verbal consent obtained.   Follow up plan:  Telephone appointment with complex care management team member scheduled for:  8/13  Encounter Outcome:  Patient Scheduled  Matthew Lynch  [facility]  [facility], [facility] Guide  Direct Dial : [phone]  Fax [phone]

## 2024-04-11 ENCOUNTER — Other Ambulatory Visit: Payer: Self-pay

## 2024-04-11 DIAGNOSIS — R102 Pelvic and perineal pain: Secondary | ICD-10-CM | POA: Diagnosis not present

## 2024-04-11 DIAGNOSIS — N3941 Urge incontinence: Secondary | ICD-10-CM | POA: Diagnosis not present

## 2024-04-11 DIAGNOSIS — M6281 Muscle weakness (generalized): Secondary | ICD-10-CM | POA: Diagnosis not present

## 2024-04-11 DIAGNOSIS — R159 Full incontinence of feces: Secondary | ICD-10-CM | POA: Diagnosis not present

## 2024-04-11 DIAGNOSIS — M6289 Other specified disorders of muscle: Secondary | ICD-10-CM | POA: Diagnosis not present

## 2024-04-11 DIAGNOSIS — M62838 Other muscle spasm: Secondary | ICD-10-CM | POA: Diagnosis not present

## 2024-04-11 NOTE — Patient Outreach (Signed)
 Complex Care Management   Visit Note  04/11/2024  Name:  Matthew Lynch MRN: [id number] DOB: 12-30-1970  Situation: Referral received for Complex Care Management related to SDOH Barriers:  Housing finding stable housing Food insecurity Financial Resource Strain I obtained verbal consent from Patient.  Visit completed with patient  on the phone  Background:   Past Medical History:  Diagnosis Date   Anxiety    Depression    Diabetes mellitus without complication (HCC)     Assessment: BSW held initial call with pt. Pt was alert and cognitive. SDOH needs were assessed and the following were identified: food insecurity, housing (finding stable housing), and financial strain. Pt reports he is currently staying on the couch of his mothers home in [place], [place]. Pt states his mother wants him to live on his own as she had medical issues of her own. Pt states daughter and extended family (cousins) have helped him in the past but they are basically done with him. Pt was recently released from jail this year and has been struggling. Pt reports being denied for SSDI twice and is actively working with legal aid of [place] [phone]) to appeal his claim. Pt reports FNS benefit, but was receptive to food pantry resources. BSW will provide resources for dental providers, food, and housing via mail. BSW will also message PCP regarding patient request for a referral to see a podiatrist. No other resources were provided/requested at this time. Patient was given BSW direct # for future reference.   SDOH Interventions    Flowsheet Row Patient Outreach Telephone from 04/11/2024 in [place] POPULATION HEALTH DEPARTMENT Patient Outreach Telephone from 03/14/2024 in [place] POPULATION HEALTH DEPARTMENT Patient Outreach Telephone from 02/10/2024 in [place] POPULATION HEALTH DEPARTMENT Telephone from 01/13/2024 in [place] POPULATION HEALTH DEPARTMENT Patient Outreach Telephone from 01/10/2024 in [place]  [facility] POP[facility]ATION [facility]ALTH DEPARTMENT Patient Outreach Telephone from 10/26/2023 in [place] POPULATION HEALTH DEPARTMENT  SDOH Interventions        Food Insecurity Interventions Community Resources Provided  [patient receives FNS benefit.] Intervention Not Indicated Intervention Not Indicated Other (Comment)  [Verified email address to send food pantry list.] Other (Comment)  [careguides] --  Housing Interventions Community Resources Provided Intervention Not Indicated Intervention Not Indicated Other (Comment)  [Patient request housing information be emailed to him.] Other (Comment)  [BSW] --  Transportation Interventions Patient Resources (Friends/Family), Payor Benefit  [mother provides transportation when he needs it to doctors appt. Would like info on healthy blue transportation. Will send via maiil.] Intervention Not Indicated -- -- -- --  Utilities Interventions Intervention Not Indicated  [patient does not pay rent/utility where he lives right now with mother.] Intervention Not Indicated Intervention Not Indicated -- Intervention Not Indicated Intervention Not Indicated  Alcohol Usage Interventions -- -- -- -- -- Intervention Not Indicated (Score <7)  Depression Interventions/Treatment  -- Medication, Counseling Medication, Counseling -- Medication, Counseling --      Recommendation:   Access resources once receive.   Follow Up Plan:   Telephone follow up appointment date/time:  04/25/2024 at 3pm  [name], [place]/[facility] - [facility] Social Worker [phone]

## 2024-04-11 NOTE — Patient Instructions (Signed)
 Visit Information  Mr. Jech was given information about Medicaid Managed Care team care coordination services as a part of their Healthy [facility] Medicaid benefit. Vivaan Helseth   If you would like to schedule transportation through your Healthy [facility] plan, please call the following number at least 2 days in advance of your appointment: [phone]  For information about your ride after you set it up, call Ride Assist at [phone]. Use this number to activate a Will Call pickup, or if your transportation is late for a scheduled pickup. Use this number, too, if you need to make a change or cancel a previously scheduled reservation.  If you need transportation services right away, call [phone]. The after-hours call center is staffed 24 hours to handle ride assistance and urgent reservation requests (including discharges) 365 days a year. Urgent trips include sick visits, hospital discharge requests and life-sustaining treatment.  Call the [facility] Line at [phone], at any time, 24 hours a day, 7 days a week. If you are in danger or need immediate medical attention call 911.   Mr. Latour - following are the goals we discussed in your visit today:   Goals Addressed             This Visit's Progress    BSW VBCI Social Work Care Plan       Problems:   Corporate treasurer , Geophysicist/field seismologist , and Housing   CSW Clinical Goal(s):   Over the next 2 weeks the Patient will work with Child psychotherapist to address concerns related to food insecurity, housing, and financial strain..  Interventions:  BSW will provided SDOH resources via mail.   Patient Goals/Self-Care Activities:  None  Plan:   Telephone follow up appointment with care management team member scheduled for:  04/25/2024 at 3PM.         Patient verbalizes understanding of instructions and care plan provided today and agrees to view in MyChart. Active MyChart status and patient understanding  of how to access instructions and care plan via MyChart confirmed with patient.     Telephone follow up appointment with Managed Medicaid care management team member scheduled for: 04/25/2024 at 3pm  [name], [place]/[facility] - [facility] Social Worker [phone]  Following is a copy of your plan of care:  There are no care plans that you recently modified to display for this patient.

## 2024-04-16 ENCOUNTER — Other Ambulatory Visit: Payer: Self-pay | Admitting: *Deleted

## 2024-04-16 NOTE — Patient Outreach (Signed)
 Complex Care Management   Visit Note  04/16/2024  Name:  Matthew Lynch MRN: [id number] DOB: 11/13/1970  Situation: Referral received for Complex Care Management related to Diabetes with Complications I obtained verbal consent from Patient.  Visit completed with patient  on the phone  Background:   Past Medical History:  Diagnosis Date   Anxiety    Depression    Diabetes mellitus without complication (HCC)     Assessment: Patient Reported Symptoms:  Cognitive Cognitive Status: Difficulties with attention and concentration, Able to follow simple commands, Normal speech and language skills      Neurological Neurological Review of Symptoms: No symptoms reported    HEENT HEENT Symptoms Reported: No symptoms reported      Cardiovascular Cardiovascular Symptoms Reported: No symptoms reported Does patient have uncontrolled Hypertension?: No    Respiratory Respiratory Symptoms Reported: No symptoms reported Respiratory Management Strategies: Routine screening  Endocrine Endocrine Symptoms Reported: Blurry vision (Consult eye doctor unable to have the recommended surgery (on hold) due to insurance coverage.) Is patient diabetic?: Yes Is patient checking blood sugars at home?: Yes List most recent blood sugar readings, include date and time of day: fasting am read was 120 at 0930 Endocrine Self-Management Outcome: 3 (uncertain)  Gastrointestinal Gastrointestinal Symptoms Reported: No symptoms reported Gastrointestinal Management Strategies: Incontinence garment/pad Gastrointestinal Comment: Verified patient is consulting a GI provider    Genitourinary Genitourinary Symptoms Reported: No symptoms reported Genitourinary Management Strategies: Incontinence garment/pad Genitourinary Comment: Verified pt is consulting a urologist  Integumentary Other Integumentary Symptoms: Patient report ongoing rash with use of his Kenalog  (prescribed medication) Additional Integumentary Details:  Followed by PCP for ongoing treatment Skin Management Strategies: Medication therapy Skin Comment: Use of his Kenalog  (prescribed medication)  Musculoskeletal Musculoskelatal Symptoms Reviewed: No symptoms reported        Psychosocial Psychosocial Symptoms Reported: No symptoms reported     Quality of Family Relationships: supportive, involved, helpful Do you feel physically threatened by others?: No      03/23/2024   11:06 AM  Depression screen PHQ 2/9  Decreased Interest 3  Down, Depressed, Hopeless 3  PHQ - 2 Score 6  Altered sleeping 3  Tired, decreased energy 3  Change in appetite 3  Feeling bad or failure about yourself  3  Trouble concentrating 3  Moving slowly or fidgety/restless 2  Suicidal thoughts 3  PHQ-9 Score 26  Difficult doing work/chores Very difficult    There were no vitals filed for this visit.  Medications Reviewed Today     Reviewed by Alvia Olam BIRCH, RN (Registered Nurse) on 04/16/24 at 1012  Med List Status: <None>   Medication Order Taking? Sig Documenting Provider Last Dose Status Informant  Continuous Glucose Sensor (DEXCOM G7 SENSOR) MISC [id number] Yes 1 Device by Does not apply route as directed. Apply new sensor every 10 days. Nicholas Bar, MD  Active   DULoxetine  (CYMBALTA ) 60 MG capsule [id number] Yes Take 1 capsule (60 mg total) by mouth 2 (two) times daily. McDiarmid, Krystal BIRCH, MD  Active   feeding supplement, GLUCERNA SHAKE, (GLUCERNA SHAKE) LIQD [id number] Yes Take 237 mLs by mouth 3 (three) times daily between meals. Nicholas Bar, MD  Active   insulin  aspart (FIASP  FLEXTOUCH) 100 UNIT/ML FlexTouch Pen [id number] Yes Inject 4-8 Units into the skin 3 (three) times daily before meals. McDiarmid, Krystal BIRCH, MD  Active   Insulin  Glargine (BASAGLAR  [facility]) 100 UNIT/ML [id number] Yes Inject 18 Units into the skin daily. McDiarmid, Krystal BIRCH, MD  Active  Insulin  Pen Needle (BD PEN NEEDLE NANO U/F) 32G X 4 MM MISC [id number] Yes Use as directed once a  day Diona Perkins, MD  Active   loperamide (IMODIUM) 2 MG capsule [id number] Yes Take 2 mg by mouth daily as needed for diarrhea or loose stools. [provider]  Active            Med Note MAUDIE, MAUDE MATSU   Tue Jun 07, 2023  3:34 PM) Takes ~4 days weekly  polyethylene glycol-electrolytes (NULYTELY) 420 g solution [id number]  do 6 oz every 30 mins until the impaction passes, can do 1/2 gallon on first day and 1/2 gallon on 2nd day.  Can continue 2 fleets enemas a day until it resolves.  Patient not taking: Reported on 04/02/2024   Craig Alan SAUNDERS, PA-C  Active   rosuvastatin  (CRESTOR ) 10 MG tablet [id number] Yes Take 1 tablet (10 mg total) by mouth daily. McDiarmid, Krystal BIRCH, MD  Active   triamcinolone  ointment (KENALOG ) 0.5 % [id number] Yes Apply 1 Application topically 2 (two) times daily. McDiarmid, Krystal BIRCH, MD  Active             Recommendation:   PCP Follow-up Continue Current Plan of Care  Follow Up Plan:   Telephone follow up appointment date/time:  05/18/2024 @ 11:00 am   Olam Ku, RN, BSN [place]  [facility], [facility] RN Care Manager Direct Dial : [phone]  Fax: [phone]

## 2024-04-16 NOTE — Patient Instructions (Signed)
 Visit Information  Thank you for taking time to visit with me today. Please don't hesitate to contact me if I can be of assistance to you before our next scheduled appointment.  Your next care management appointment is by telephone on 05/18/2024 at 11:00 am  Please call the care guide team at [phone] if you need to cancel, schedule, or reschedule an appointment.   Please call the Suicide and Crisis Lifeline: 988 call the [place]  National Suicide Prevention Lifeline: 587-478-7467 or TTY: 443-178-8770 TTY 403-396-9875) to talk to a trained counselor call 1-800-273-TALK (toll free, 24 hour hotline) if you are experiencing a Mental Health or Behavioral Health Crisis or need someone to talk to.  Olam Ku, RN, BSN [place]  [facility], [place] - [facility] Health RN Care Manager Direct Dial : [phone]  Fax: [phone]

## 2024-04-18 ENCOUNTER — Other Ambulatory Visit: Payer: Self-pay | Admitting: Family Medicine

## 2024-04-18 DIAGNOSIS — E1165 Type 2 diabetes mellitus with hyperglycemia: Secondary | ICD-10-CM

## 2024-04-24 DIAGNOSIS — R102 Pelvic and perineal pain: Secondary | ICD-10-CM | POA: Diagnosis not present

## 2024-04-24 DIAGNOSIS — M62838 Other muscle spasm: Secondary | ICD-10-CM | POA: Diagnosis not present

## 2024-04-24 DIAGNOSIS — M6281 Muscle weakness (generalized): Secondary | ICD-10-CM | POA: Diagnosis not present

## 2024-04-24 DIAGNOSIS — N3941 Urge incontinence: Secondary | ICD-10-CM | POA: Diagnosis not present

## 2024-04-24 DIAGNOSIS — R159 Full incontinence of feces: Secondary | ICD-10-CM | POA: Diagnosis not present

## 2024-04-24 DIAGNOSIS — M6289 Other specified disorders of muscle: Secondary | ICD-10-CM | POA: Diagnosis not present

## 2024-04-25 ENCOUNTER — Other Ambulatory Visit: Payer: Self-pay

## 2024-04-25 NOTE — Patient Instructions (Signed)
 Visit Information  [name] was given information about Medicaid Managed Care team care coordination services as a part of their Healthy [facility] Medicaid benefit. Matthew Lynch   If you would like to schedule transportation through your Healthy [place] plan, please call the following number at least 2 days in advance of your appointment: [phone]  For information about your ride after you set it up, call Ride Assist at [phone]. Use this number to activate a Will Call pickup, or if your transportation is late for a scheduled pickup. Use this number, too, if you need to make a change or cancel a previously scheduled reservation.  If you need transportation services right away, call [phone]. The after-hours call center is staffed 24 hours to handle ride assistance and urgent reservation requests (including discharges) 365 days a year. Urgent trips include sick visits, hospital discharge requests and life-sustaining treatment.  Call the [facility] Line at [phone], at any time, 24 hours a day, 7 days a week. If you are in danger or need immediate medical attention call 911.   Matthew Lynch - following are the goals we discussed in your visit today:   Goals Addressed             This Visit's Progress    BSW VBCI Social Work Care Plan   On track    Problems:   Corporate treasurer , Geophysicist/field seismologist , and Housing   CSW Clinical Goal(s):   Over the next 2 weeks the Patient will work with Child psychotherapist to address concerns related to food insecurity, housing, and financial strain..  Interventions:  BSW will provided SDOH resources via mail.   Patient Goals/Self-Care Activities:  None  Plan:   Telephone follow up appointment with care management team member scheduled for:  04/25/2024 at 3PM.         Patient verbalizes understanding of instructions and care plan provided today and agrees to view in MyChart. Active MyChart status and patient  understanding of how to access instructions and care plan via MyChart confirmed with patient.     Telephone follow up appointment with Managed Medicaid care management team member scheduled for: 05/09/2024  Matthew Lynch, [place]/VBCI - [facility] Social Worker [phone]   Following is a copy of your plan of care:  There are no care plans that you recently modified to display for this patient.

## 2024-04-25 NOTE — Patient Outreach (Signed)
 Complex Care Management   Visit Note  04/25/2024  Name:  Matthew Lynch MRN: [id number] DOB: 08/12/1971  Situation: Referral received for Complex Care Management related to SDOH Barriers:  Housing finding stable housing Food insecurity I obtained verbal consent from Patient.  Visit completed with Patient  on the phone  Background:   Past Medical History:  Diagnosis Date   Anxiety    Depression    Diabetes mellitus without complication (HCC)     Assessment: BSW held f/u appt with pt. Pt was alert and cognitive. Pt reports he has not received resources via mail. BSW checked with Leita Lyme and confirmed resources were mailed out on 08/13. Pt was advised to continue waiting and would check in with pt at next follow up re resources. Resources will be mailed out again. Pt confirmed he got an appt with podiatry coming up. Pt declined LCSW services at this time and was encouraged to seek services in the future if he changed his mind. Pt was referred to [place] of [employer] and [place] and [employer] for section 8 housing.  BSW and pt called [employer] together re extra benefits for fruits & veggies and housing. A referral for housing was placed for pt over the phone and will be getting a call back in 3-5 business days. If approved, pt could receive up to $500 towards securing housing.  Pt was enrolled into [employer] rewards over the phone and has signed up to receive his first food box delivery. Pt was explained how to place an additional order in the future. No other resources were provided/requested at this time.   SDOH Interventions    Flowsheet Row Patient Outreach Telephone from 04/11/2024 in [place] POPULATION HEALTH DEPARTMENT Patient Outreach Telephone from 03/14/2024 in [place] POPULATION HEALTH DEPARTMENT Patient Outreach Telephone from 02/10/2024 in [place] POPULATION HEALTH DEPARTMENT Telephone from 01/13/2024 in [place] POPULATION HEALTH  DEPARTMENT Patient Outreach Telephone from 01/10/2024 in [place] POPULATION HEALTH DEPARTMENT Patient Outreach Telephone from 10/26/2023 in [place] POPULATION HEALTH DEPARTMENT  SDOH Interventions        Food Insecurity Interventions Community Resources Provided  [patient receives FNS benefit.] Intervention Not Indicated Intervention Not Indicated Other (Comment)  [Verified email address to send food pantry list.] Other (Comment)  [careguides] --  Housing Interventions Community Resources Provided Intervention Not Indicated Intervention Not Indicated Other (Comment)  [Patient request housing information be emailed to him.] Other (Comment)  [BSW] --  Transportation Interventions Patient Resources (Friends/Family), Payor Benefit  [mother provides transportation when he needs it to doctors appt. Would like info on healthy blue transportation. Will send via maiil.] Intervention Not Indicated -- -- -- --  Utilities Interventions Intervention Not Indicated  [patient does not pay rent/utility where he lives right now with mother.] Intervention Not Indicated Intervention Not Indicated -- Intervention Not Indicated Intervention Not Indicated  Alcohol Usage Interventions -- -- -- -- -- Intervention Not Indicated (Score <7)  Depression Interventions/Treatment  -- Medication, Counseling Medication, Counseling -- Medication, Counseling --      There were no vitals filed for this visit.  Medications Reviewed Today   Medications were not reviewed in this encounter     Recommendation:   Call housing authority agencies re section 8 housing and application process.   Follow Up Plan:   Telephone follow up appointment date/time:  05/09/2024 at 2pm  [name], [place]/VBCI - [facility] Social Worker [phone]

## 2024-04-27 ENCOUNTER — Ambulatory Visit: Admitting: Podiatry

## 2024-05-01 ENCOUNTER — Ambulatory Visit: Admitting: Family Medicine

## 2024-05-01 ENCOUNTER — Encounter: Payer: Self-pay | Admitting: Podiatry

## 2024-05-01 ENCOUNTER — Ambulatory Visit (INDEPENDENT_AMBULATORY_CARE_PROVIDER_SITE_OTHER)

## 2024-05-01 ENCOUNTER — Ambulatory Visit (INDEPENDENT_AMBULATORY_CARE_PROVIDER_SITE_OTHER): Admitting: Podiatry

## 2024-05-01 DIAGNOSIS — M7751 Other enthesopathy of right foot: Secondary | ICD-10-CM

## 2024-05-01 DIAGNOSIS — M7752 Other enthesopathy of left foot: Secondary | ICD-10-CM

## 2024-05-01 DIAGNOSIS — R0989 Other specified symptoms and signs involving the circulatory and respiratory systems: Secondary | ICD-10-CM

## 2024-05-01 DIAGNOSIS — E1140 Type 2 diabetes mellitus with diabetic neuropathy, unspecified: Secondary | ICD-10-CM

## 2024-05-01 DIAGNOSIS — R2681 Unsteadiness on feet: Secondary | ICD-10-CM | POA: Diagnosis not present

## 2024-05-01 DIAGNOSIS — M79675 Pain in left toe(s): Secondary | ICD-10-CM | POA: Diagnosis not present

## 2024-05-01 DIAGNOSIS — B351 Tinea unguium: Secondary | ICD-10-CM

## 2024-05-01 DIAGNOSIS — M79674 Pain in right toe(s): Secondary | ICD-10-CM | POA: Diagnosis not present

## 2024-05-01 DIAGNOSIS — L97411 Non-pressure chronic ulcer of right heel and midfoot limited to breakdown of skin: Secondary | ICD-10-CM

## 2024-05-01 MED ORDER — MUPIROCIN 2 % EX OINT: 1.0000 | TOPICAL_OINTMENT | Freq: Two times a day (BID) | CUTANEOUS | 2 refills | Status: AC

## 2024-05-01 NOTE — Progress Notes (Unsigned)
 Subjective:  Patient ID: Matthew Lynch, male    DOB: Aug 17, 1971,  MRN: [id number]  Chief Complaint  Patient presents with   Diabetes    Rm13 Diabetic foot Care/Neuropathy/blood sugar 223/ A1c 9.4    Discussed the use of AI scribe software for clinical note transcription with the patient, who gave verbal consent to proceed.  History of Present Illness Matthew Lynch is a 53 year old male with diabetes and neuropathy who presents for a foot check.  He experiences weakness and tingling in his feet, describing the sensation as 'he explodes'. Pain in his feet is exacerbated by pressure and is described as a pulling sensation up the leg. He has been taking duloxetine  for approximately two years to manage these symptoms.  His blood sugar levels have been elevated recently, with readings in the 200s and 300s. He has no history of wounds on his feet that have been difficult to heal, although his shoes sometimes cause scrapes on his feet, particularly when they swell. These areas take a long time to heal. No recent injuries or falls.  He also needs his nails trimmed as they are somewhat thickened and elongated as difficulty trimming them.      Objective:    Physical Exam General: AAO x3, NAD  Dermatological: Pleasant mildly elongated with hypertrophic dystrophic with discoloration.  There is no edema, erythema.  No open lesions.  Vascular: Dorsalis Pedis artery and Posterior Tibial artery pedal pulses are palpable bilateral with immedate capillary fill time.  Pedal hair decreased. There is no pain with calf compression, swelling, warmth, erythema.   Neruologic: There is a mildly decreased with Semmes Weinstein monofilament.  Musculoskeletal: Mild diffuse tenderness bilateral lower extremities but unable to express any area pinpoint tenderness.  Flexor, extensor tendons intact. Gait: Unassisted, Nonantalgic.     No images are attached to the encounter.     Results RADIOLOGY Bilateral foot X-ray: No evidence of acute fracture. Joint space maintained. (05/01/2024)   Assessment:   1. Capsulitis of metatarsophalangeal (MTP) joint of left foot   2. Capsulitis of metatarsophalangeal (MTP) joint of right foot   3. Gait instability   4. Decreased pedal pulses   5. Ulcer of right heel, limited to breakdown of skin [facility])      Plan:  Patient was evaluated and treated and all questions answered.  Assessment and Plan Assessment & Plan Type 2 diabetes mellitus with diabetic peripheral neuropathy Diabetic peripheral neuropathy with tingling, weakness, and pain in feet. Elevated blood sugar levels. Managed with duloxetine . - Continue duloxetine  for neuropathic symptoms. - Discuss topical medications for neuropathy.  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 Thin skin with delayed healing. Antibiotic ointment recommended over steroid cream. - Prescribe mupirocin  ointment for wounds. - Apply Band-Aid daily over affected area. - Monitor for any clinical signs or symptoms of infection and directed to call the office immediately should any occur or go to the ER. - if not healed in the next 1-2 weeks to let me know or sooner if any issues arise.   Chronic bilateral foot pain Chronic foot pain exacerbated by pressure, related to neuropathy and wounds. No acute fractures on x-ray, joint spaces maintained.  Unsteadiness on feet Unsteadiness possibly related to neuropathy and foot pain. No recent falls or injuries. Referral to PT  Evaluation for peripheral vascular disease Evaluated for peripheral vascular disease due to hair loss Palpable pulses however will check an ABI ordered  for circulation assessment.      Return in about 3 months (around 07/31/2024).   Donnice JONELLE Fees DPM

## 2024-05-01 NOTE — Patient Instructions (Addendum)
 Apply a small amount of the mupirocin  ointment to the back of the heel daily.  Monitor for any signs/symptoms of infection. Call the office immediately if any occur or go directly to the emergency room. Call with any questions/concerns.  ---  Diabetes Mellitus and Foot Care Diabetes, also called diabetes mellitus, may cause problems with your feet and legs because of poor blood flow (circulation). Poor circulation may make your skin: Become thinner and drier. Break more easily. Heal more slowly. Peel and crack. You may also have nerve damage (neuropathy). This can cause decreased feeling in your legs and feet. This means that you may not notice minor injuries to your feet that could lead to more serious problems. Finding and treating problems early is the best way to prevent future foot problems. How to care for your feet Foot hygiene  Wash your feet daily with warm water and mild soap. Do not use hot water. Then, pat your feet and the areas between your toes until they are fully dry. Do not soak your feet. This can dry your skin. Trim your toenails straight across. Do not dig under them or around the cuticle. File the edges of your nails with an emery board or nail file. Apply a moisturizing lotion or petroleum jelly to the skin on your feet and to dry, brittle toenails. Use lotion that does not contain alcohol and is unscented. Do not apply lotion between your toes. Shoes and socks Wear clean socks or stockings every day. Make sure they are not too tight. Do not wear knee-high stockings. These may decrease blood flow to your legs. Wear shoes that fit well and have enough cushioning. Always look in your shoes before you put them on to be sure there are no objects inside. To break in new shoes, wear them for just a few hours a day. This prevents injuries on your feet. Wounds, scrapes, corns, and calluses  Check your feet daily for blisters, cuts, bruises, sores, and redness. If you cannot see  the bottom of your feet, use a mirror or ask someone for help. Do not cut off corns or calluses or try to remove them with medicine. If you find a minor scrape, cut, or break in the skin on your feet, keep it and the skin around it clean and dry. You may clean these areas with mild soap and water. Do not clean the area with peroxide, alcohol, or iodine. If you have a wound, scrape, corn, or callus on your foot, look at it several times a day to make sure it is healing and not infected. Check for: Redness, swelling, or pain. Fluid or blood. Warmth. Pus or a bad smell. General tips Do not cross your legs. This may decrease blood flow to your feet. Do not use heating pads or hot water bottles on your feet. They may burn your skin. If you have lost feeling in your feet or legs, you may not know this is happening until it is too late. Protect your feet from hot and cold by wearing shoes, such as at the beach or on hot pavement. Schedule a complete foot exam at least once a year or more often if you have foot problems. Report any cuts, sores, or bruises to your health care provider right away. Where to find more information American Diabetes Association: diabetes.org Association of Diabetes Care & Education Specialists: diabeteseducator.org Contact a health care provider if: You have a condition that increases your risk of infection, and you  have any cuts, sores, or bruises on your feet. You have an injury that is not healing. You have redness on your legs or feet. You feel burning or tingling in your legs or feet. You have pain or cramps in your legs and feet. Your legs or feet are numb. Your feet always feel cold. You have pain around any toenails. Get help right away if: You have a wound, scrape, corn, or callus on your foot and: You have signs of infection. You have a fever. You have a red line going up your leg. This information is not intended to replace advice given to you by your  health care provider. Make sure you discuss any questions you have with your health care provider. Document Revised: 02/17/2022 Document Reviewed: 02/17/2022 Elsevier Patient Education  2024 [employer].

## 2024-05-09 ENCOUNTER — Other Ambulatory Visit: Payer: Self-pay

## 2024-05-09 NOTE — Patient Outreach (Signed)
 BSW attempted outreach attempt twice during scheduled f/u today without success. BSW was not able to leave a VM. BSW will try again next week 09/17 at 2pm.

## 2024-05-15 ENCOUNTER — Ambulatory Visit (HOSPITAL_COMMUNITY)
Admission: RE | Admit: 2024-05-15 | Discharge: 2024-05-15 | Disposition: A | Discharge status: Home / Self Care | Source: Ambulatory Visit | Attending: Podiatry | Admitting: Podiatry

## 2024-05-15 DIAGNOSIS — R0989 Other specified symptoms and signs involving the circulatory and respiratory systems: Secondary | ICD-10-CM | POA: Diagnosis not present

## 2024-05-16 ENCOUNTER — Other Ambulatory Visit: Payer: Self-pay

## 2024-05-16 LAB — VAS US ABI WITH/WO TBI
Left ABI: 1.18
Right ABI: 1.12

## 2024-05-16 NOTE — Patient Instructions (Signed)
 Visit Information  Matthew Lynch was given information about Medicaid Managed Care team care coordination services as a part of their Healthy [facility] Medicaid benefit. [name]   If you would like to schedule transportation through your Healthy [facility] plan, please call the following number at least 2 days in advance of your appointment: [phone]  For information about your ride after you set it up, call Ride Assist at [phone]. Use this number to activate a Will Call pickup, or if your transportation is late for a scheduled pickup. Use this number, too, if you need to make a change or cancel a previously scheduled reservation.  If you need transportation services right away, call [phone]. The after-hours call center is staffed 24 hours to handle ride assistance and urgent reservation requests (including discharges) 365 days a year. Urgent trips include sick visits, hospital discharge requests and life-sustaining treatment.  Call the [facility] Line at [phone], at any time, 24 hours a day, 7 days a week. If you are in danger or need immediate medical attention call 911.   Matthew Lynch - following are the goals we discussed in your visit today:   Goals Addressed             This Visit's Progress    COMPLETED: BSW VBCI Social Work Care Plan   On track    Problems:   Corporate treasurer , Geophysicist/field seismologist , and Housing   CSW Clinical Goal(s):   Over the next 2 weeks the Patient will work with Child psychotherapist to address concerns related to food insecurity, housing, and financial strain..  Interventions:  BSW will provided SDOH resources via mail.   Patient Goals/Self-Care Activities:  None  Plan:   Telephone follow up appointment with care management team member scheduled for:  04/25/2024 at 3PM.         Please see education materials related to food, housing, transportation, and healthy blue extra benefits provided by mail and email.    Patient verbalizes understanding of instructions and care plan provided today and agrees to view in MyChart. Active MyChart status and patient understanding of how to access instructions and care plan via MyChart confirmed with patient.     No further follow up required: patient declined further follow up appointments at this time.   [name], BSW [place]/VBCI - [employer] Social Worker [phone]   Following is a copy of your plan of care:  There are no care plans that you recently modified to display for this patient.

## 2024-05-16 NOTE — Patient Outreach (Signed)
 Complex Care Management   Visit Note  05/16/2024  Name:  Matthew Lynch MRN: [id number] DOB: 1971-01-15  Situation: Referral received for Complex Care Management related to SDOH Barriers:  Housing stable housing Food insecurity Financial Resource Strain I obtained verbal consent from Patient.  Visit completed with POA  on the phone  Background:   Past Medical History:  Diagnosis Date   Anxiety    Depression    Diabetes mellitus without complication (HCC)     Assessment: BSW held df.u appt with pt. Pt was alert and cognitive. Pt confirmed he recently received wellness food box from [employer]. BSW reminded pt he can order another box in October and confirmed he understood how to do so over the phone. Pt states he is still living with mother. BSW will provide patient resources for food, housing, and dental via mail and email. Pt declined referral to [facility] for LCSW services and declined further follow up appts. BSW informed pt how he can get connected with services again in the future and informed him he would notify his RNCM Olam Ku that BSW is dropping from care team. Pt understood and agreed. No other resources were provided/requested at this time. BSW reviewed upcoming appts with patient and encouraged him to schedule transportation with [employer] for Friday appt with PCP. Pt agreed.   SDOH Interventions    Flowsheet Row Patient Outreach Telephone from 05/16/2024 in [place] POPULATION HEALTH DEPARTMENT Patient Outreach Telephone from 04/11/2024 in [place] POPULATION HEALTH DEPARTMENT Patient Outreach Telephone from 03/14/2024 in [place] POPULATION HEALTH DEPARTMENT Patient Outreach Telephone from 02/10/2024 in [place] POPULATION HEALTH DEPARTMENT Telephone from 01/13/2024 in [place] POPULATION HEALTH DEPARTMENT Patient Outreach Telephone from 01/10/2024 in [place] POPULATION HEALTH DEPARTMENT  SDOH Interventions        Food Insecurity Interventions Community  Resources Provided [employer] Provided  [patient receives FNS benefit.] Intervention Not Indicated Intervention Not Indicated Other (Comment)  [Verified email address to send food pantry list.] Other (Comment)  [careguides]  Housing Interventions Community Resources Provided [employer] Provided Intervention Not Indicated Intervention Not Indicated Other (Comment)  [Patient request housing information be emailed to him.] Other (Comment)  [BSW]  Transportation Interventions Patient Resources (Friends/Family), Payor Benefit Patient Resources (Friends/Family), Payor Benefit  [mother provides transportation when he needs it to doctors appt. Would like info on healthy blue transportation. Will send via maiil.] Intervention Not Indicated -- -- --  Utilities Interventions Intervention Not Indicated Intervention Not Indicated  [patient does not pay rent/utility where he lives right now with mother.] Intervention Not Indicated Intervention Not Indicated -- Intervention Not Indicated  Depression Interventions/Treatment  -- -- Medication, Counseling Medication, Counseling -- Medication, Counseling  Financial Strain Interventions Community Resources Provided -- -- -- -- --      Recommendation:   Continue all medical appts as scheduled.   Follow Up Plan:   Patient has met all care management goals. Care Management case will be closed. Patient has been provided contact information should new needs arise.   [name], BSW [place]/VBCI - [employer] Social Worker [phone]

## 2024-05-17 ENCOUNTER — Telehealth: Payer: Self-pay

## 2024-05-17 NOTE — Telephone Encounter (Signed)
 Prior authorization submitted for DEXCOM G7 SENSORS to [employer]ICA[employer] via Latent.   Key: [id number]

## 2024-05-17 NOTE — Telephone Encounter (Signed)
 Pharmacy Patient Advocate Encounter  Received notification from [employer] that Prior Authorization for [facility] G7 SENSORS has been APPROVED from 05/17/24 to 11/13/24   PA #/Case ID/Reference #: [id number]

## 2024-05-18 ENCOUNTER — Other Ambulatory Visit: Payer: Self-pay | Admitting: *Deleted

## 2024-05-18 ENCOUNTER — Ambulatory Visit (INDEPENDENT_AMBULATORY_CARE_PROVIDER_SITE_OTHER): Admitting: Family Medicine

## 2024-05-18 ENCOUNTER — Encounter: Payer: Self-pay | Admitting: Family Medicine

## 2024-05-18 VITALS — BP 108/78 | HR 85 | Ht 65.0 in | Wt 124.6 lb

## 2024-05-18 DIAGNOSIS — E1140 Type 2 diabetes mellitus with diabetic neuropathy, unspecified: Secondary | ICD-10-CM | POA: Diagnosis not present

## 2024-05-18 DIAGNOSIS — Z794 Long term (current) use of insulin: Secondary | ICD-10-CM | POA: Diagnosis not present

## 2024-05-18 DIAGNOSIS — R102 Pelvic and perineal pain: Secondary | ICD-10-CM

## 2024-05-18 DIAGNOSIS — E1165 Type 2 diabetes mellitus with hyperglycemia: Secondary | ICD-10-CM

## 2024-05-18 LAB — POCT GLYCOSYLATED HEMOGLOBIN (HGB A1C): HbA1c, POC (controlled diabetic range): 7.6 % — AB (ref 0.0–7.0)

## 2024-05-18 MED ORDER — DULOXETINE HCL 60 MG PO CPEP
60.0000 mg | ORAL_CAPSULE | Freq: Two times a day (BID) | ORAL | 1 refills | Status: AC
Start: 2024-05-18 — End: ?

## 2024-05-18 NOTE — Progress Notes (Signed)
    SUBJECTIVE:   CHIEF COMPLAINT / HPI:   Diabetes  Patient says he is doing well on his current regimen. Denies any side effects.  Denies any hypoglycemic episodes.  He is still working with his legal aid team to apply for disability.  They are going through the appeal process.  Pelvic pain:  Patient still has sharp shooting pain into his scrotum.  This is along with neuropathic pain that goes up and down his entire legs.  He also mentions erectile dysfunction.  Denies any normal discharge.  Denies dysuria.  PERTINENT  PMH / PSH: T2DM  OBJECTIVE:   BP 108/78   Pulse 85   Ht 5' 5 (1.651 m)   Wt 124 lb 9.6 oz (56.5 kg)   SpO2 100%   BMI 20.73 kg/m   General: Well-appearing, in no acute distress CV: well perfused  Resp: Normal work of breathing on room air   ASSESSMENT/PLAN:   Assessment & Plan Type 2 diabetes mellitus with hyperglycemia, with long-term current use of insulin  (HCC) A1c today with improved control 7.6 from 9.4.  - Continue Basaglar  18 units daily and aspart 4 to 8 units 3 times a day with meals  - continue rosuvastatin .  - Follow up with Dr. Koval for evaluation of CGM data and adjustment of insulin  dosing  Neuropathy due to type 2 diabetes mellitus (HCC) Patient continues to have neuropathy however greatly greatly improved and patient is much more functional compared to previously now is able to exercise at least a little bit and is able to move around and sit in a chair without excruciating pain. - Continue Cymbalta  Pelvic pain in male Patient continues to have pelvic pain.  Is difficult to ascertain what this is caused by.  Could be due to neuropathy.  We have empirically treated him for epididymitis in the past though urine culture came back negative.  Exam previously did not show evidence of prostatitis.  No dysuria or abdominal pain unlikely irritable bowel syndrome. - Has follow-up with urology on Monday would appreciate any further recommendations from  urology. - Continue pelvic physical therapy     Areta Saliva, MD [facility]

## 2024-05-18 NOTE — Patient Instructions (Signed)
 Visit Information  Thank you for taking time to visit with me today. Please don't hesitate to contact me if I can be of assistance to you before our next scheduled appointment.  Your next care management appointment is by telephone on 06/11/2024 at 10:30 am  Please call the care guide team at [phone] if you need to cancel, schedule, or reschedule an appointment.   Please call the Suicide and Crisis Lifeline: 988 call the [place]  National Suicide Prevention Lifeline: (763)274-4482 or TTY: (519)011-9569 TTY 213-844-1893) to talk to a trained counselor call 1-800-273-TALK (toll free, 24 hour hotline) if you are experiencing a Mental Health or Behavioral Health Crisis or need someone to talk to.   Olam Ku, RN, BSN [place]  [facility], [facility] Health RN Care Manager Direct Dial : [phone]  Fax: [phone]

## 2024-05-18 NOTE — Patient Outreach (Signed)
 Complex Care Management   Visit Note  05/18/2024  Name:  Matthew Lynch MRN: [id number] DOB: April 13, 1971  Situation: Referral received for Complex Care Management related to Diabetes with Complications I obtained verbal consent from Patient.  Visit completed with Patient  on the phone  Background:   Past Medical History:  Diagnosis Date   Anxiety    Depression    Diabetes mellitus without complication (HCC)     Assessment: Education provided on A1C as patient an office visit today with PCP for labs. Will provide education on A1C and carbohydrates counting related diabetes dietary measures to MyChart for patient review. Patient Reported Symptoms:  Cognitive Cognitive Status: Able to follow simple commands, Normal speech and language skills, Difficulties with attention and concentration (Historicallly reports difficulties with attention and concentration)      Neurological Neurological Review of Symptoms: No symptoms reported    HEENT HEENT Symptoms Reported: No symptoms reported      Cardiovascular Cardiovascular Symptoms Reported: No symptoms reported Cardiovascular Management Strategies: Coping strategies, Routine screening Cardiovascular Comment: Pt reports he stopped smoking in June 2025. Denies any assistance for maintaining his cessation  Respiratory Respiratory Symptoms Reported: No symptoms reported    Endocrine Endocrine Symptoms Reported: No symptoms reported (Blurred visions resolved and patient reports visit to his eye provide June 2025) Is patient diabetic?: Yes Is patient checking blood sugars at home?: Yes List most recent blood sugar readings, include date and time of day: Reports fasting this morning at 130 remains asymptomatic Endocrine Self-Management Outcome: 4 (good)  Gastrointestinal Gastrointestinal Symptoms Reported: No symptoms reported Gastrointestinal Management Strategies: Incontinence garment/pad Gastrointestinal Comment: Continue to see GI consult  when scheduled.    Genitourinary Genitourinary Symptoms Reported: No symptoms reported Genitourinary Comment: Continue to consult urologist when needed  Integumentary Integumentary Symptoms Reported: Rash Other Integumentary Symptoms: Patients rash has improved with Kenalog  (prescribed medication) Additional Integumentary Details: Pt report pending f/u today with PCP Skin Management Strategies: Medication therapy, Routine screening  Musculoskeletal Musculoskelatal Symptoms Reviewed: No symptoms reported Musculoskeletal Management Strategies: Medication therapy, Routine screening      Psychosocial Psychosocial Symptoms Reported: No symptoms reported          There were no vitals filed for this visit.  Medications Reviewed Today     Reviewed by Alvia Olam BIRCH, RN (Registered Nurse) on 05/18/24 at 1112  Med List Status: <None>   Medication Order Taking? Sig Documenting Provider Last Dose Status Informant  Continuous Glucose Sensor (DEXCOM G7 SENSOR) MISC [id number] Yes 1 Device by Does not apply route as directed. Apply new sensor every 10 days. Nicholas Bar, MD  Active   DULoxetine  (CYMBALTA ) 60 MG capsule [id number] Yes Take 1 capsule (60 mg total) by mouth 2 (two) times daily. McDiarmid, Krystal BIRCH, MD  Active   feeding supplement, GLUCERNA SHAKE, (GLUCERNA SHAKE) LIQD [id number] Yes Take 237 mLs by mouth 3 (three) times daily between meals. Nicholas Bar, MD  Active   insulin  aspart (FIASP  FLEXTOUCH) 100 UNIT/ML FlexTouch Pen [id number] Yes Inject 4-8 Units into the skin 3 (three) times daily before meals. McDiarmid, Krystal BIRCH, MD  Active   Insulin  Glargine (BASAGLAR  KWIKPEN) 100 UNIT/ML [id number] Yes Inject 18 Units into the skin daily. McDiarmid, Krystal BIRCH, MD  Active   Insulin  Pen Needle (BD PEN NEEDLE NANO U/F) 32G X 4 MM MISC [id number] Yes Use as directed once a day Diona Perkins, MD  Active   loperamide (IMODIUM) 2 MG capsule [id number] Yes Take 2 mg by mouth daily as  needed for diarrhea or  loose stools. [provider]  Active            Med Note MAUDIE, MAUDE KANDICE Debar Jun 07, 2023  3:34 PM) Takes ~4 days weekly  mupirocin  ointment (BACTROBAN ) 2 % [id number] Yes Apply 1 Application topically 2 (two) times daily. Gershon Donnice SAUNDERS, DPM  Active   polyethylene glycol-electrolytes (NULYTELY) 420 g solution [id number]  do 6 oz every 30 mins until the impaction passes, can do 1/2 gallon on first day and 1/2 gallon on 2nd day.  Can continue 2 fleets enemas a day until it resolves.  Patient not taking: Reported on 05/01/2024   Craig Alan SAUNDERS, PA-C  Active   rosuvastatin  (CRESTOR ) 10 MG tablet [id number] Yes Take 1 tablet (10 mg total) by mouth daily. McDiarmid, Krystal BIRCH, MD  Active   triamcinolone  ointment (KENALOG ) 0.5 % [id number] Yes Apply 1 Application topically 2 (two) times daily. McDiarmid, Krystal BIRCH, MD  Active             Recommendation:   PCP Follow-up Continue Current Plan of Care  Follow Up Plan:   Telephone follow up appointment date/time:  06/11/2024 @ 10:30 am   Olam Ku, RN, BSN [place]  [facility], [facility] RN Care Manager Direct Dial : [phone]  Fax: [phone]

## 2024-05-18 NOTE — Assessment & Plan Note (Signed)
 A1c today with improved control 7.6 from 9.4.  - Continue Basaglar  18 units daily and aspart 4 to 8 units 3 times a day with meals  - continue rosuvastatin .  - Follow up with Dr. Koval for evaluation of CGM data and adjustment of insulin  dosing

## 2024-05-18 NOTE — Patient Instructions (Signed)
 It was wonderful to see you today.  Please bring ALL of your medications with you to every visit.   Today we talked about:  Diabetes - Your sugars are much better! Continue on your current dose and course.   For your pelvic pain - Continue the cymbalta  we have for your neuropathy. Continue pelvic floor PT and you can discuss erectile dysfunction at your upcoming urology appointment.   For your FMLA forms lets make a follow up appointment for your diabetes and to discuss these forms in a month. You can also drop off the forms earlier if you would like.   Please follow up in 1 month   Thank you for choosing [facility].   Please call [phone] with any questions about today's appointment.  Please be sure to schedule follow up at the front desk before you leave today.   Areta Saliva, MD  Family Medicine

## 2024-05-18 NOTE — Assessment & Plan Note (Signed)
 Patient continues to have pelvic pain.  Is difficult to ascertain what this is caused by.  Could be due to neuropathy.  We have empirically treated him for epididymitis in the past though urine culture came back negative.  Exam previously did not show evidence of prostatitis.  No dysuria or abdominal pain unlikely irritable bowel syndrome. - Has follow-up with urology on Monday would appreciate any further recommendations from urology. - Continue pelvic physical therapy

## 2024-05-18 NOTE — Assessment & Plan Note (Signed)
 Patient continues to have neuropathy however greatly greatly improved and patient is much more functional compared to previously now is able to exercise at least a little bit and is able to move around and sit in a chair without excruciating pain. - Continue Cymbalta 

## 2024-05-22 ENCOUNTER — Ambulatory Visit: Payer: Self-pay | Admitting: Podiatry

## 2024-06-07 ENCOUNTER — Telehealth: Payer: Self-pay | Admitting: Family Medicine

## 2024-06-07 NOTE — Telephone Encounter (Signed)
 Called patient to schedule appointment with Dr. Koval for diabetes. VM not set up if patient calls back please assist in scheduling.   Thanks!

## 2024-06-11 ENCOUNTER — Other Ambulatory Visit: Payer: Self-pay | Admitting: *Deleted

## 2024-06-11 NOTE — Patient Outreach (Signed)
 Complex Care Management   Visit Note  06/11/2024  Name:  Matthew Lynch MRN: [id number] DOB: 29-Sep-1970  Situation: Referral received for Complex Care Management related to DM II I obtained verbal consent from Patient.  Visit completed with Patient  on the phone  Background:   Past Medical History:  Diagnosis Date   Anxiety    Depression    Diabetes mellitus without complication (HCC)     Assessment: Patient Reported Symptoms:  Cognitive Cognitive Status: Able to follow simple commands, Alert and oriented to person, place, and time, Normal speech and language skills   Health Maintenance Behaviors: Annual physical exam  Neurological Neurological Review of Symptoms: No symptoms reported Neurological Management Strategies: Medication therapy  HEENT HEENT Symptoms Reported: No symptoms reported      Cardiovascular Cardiovascular Symptoms Reported: No symptoms reported Does patient have uncontrolled Hypertension?: No Cardiovascular Management Strategies: Routine screening  Respiratory Respiratory Symptoms Reported: No symptoms reported Respiratory Management Strategies: Routine screening  Endocrine Endocrine Symptoms Reported: No symptoms reported Is patient diabetic?: Yes Is patient checking blood sugars at home?: Yes List most recent blood sugar readings, include date and time of day: Fasting CBG 0918 was 235 as pt remains asymptomatic. Last A1C has improved from 9.4 (02/08/2025) to 7.6% on 05/18/2024. Continued to praise patient for this improvement and encouraged ongoing adherence in managing his diabetes. Endocrine Self-Management Outcome: 4 (good)  Gastrointestinal Gastrointestinal Symptoms Reported: Incontinence Gastrointestinal Management Strategies: Incontinence garment/pad Gastrointestinal Comment: GI provider involved when needed.    Genitourinary Genitourinary Symptoms Reported: Incontinence Genitourinary Management Strategies: Incontinence garment/pad Genitourinary  Comment: Urologist involved when needed  Integumentary Integumentary Symptoms Reported: Rash Other Integumentary Symptoms: History of an existing rash as patient continues to use Kenalog  as prescribed. Additional Integumentary Details: RNCM inquired if pt needs something stronger for resolution to this ongoing issues-patient declined indicating he will continue to utlize the current medication prescribed although the process is slow. Skin Management Strategies: Medication therapy, Routine screening Skin Self-Management Outcome: 4 (good)  Musculoskeletal Musculoskelatal Symptoms Reviewed: No symptoms reported Musculoskeletal Management Strategies: Medication therapy, Routine screening      Psychosocial Psychosocial Symptoms Reported: No symptoms reported     Quality of Family Relationships: involved, supportive, helpful Do you feel physically threatened by others?: No     There were no vitals filed for this visit.  Medications Reviewed Today     Reviewed by Alvia Olam BIRCH, RN (Registered Nurse) on 06/11/24 at 1040  Med List Status: <None>   Medication Order Taking? Sig Documenting Provider Last Dose Status Informant  Continuous Glucose Sensor (DEXCOM G7 SENSOR) MISC [id number] Yes 1 Device by Does not apply route as directed. Apply new sensor every 10 days. Nicholas Bar, MD  Active   DULoxetine  (CYMBALTA ) 60 MG capsule [id number] Yes Take 1 capsule (60 mg total) by mouth 2 (two) times daily. Nicholas Bar, MD  Active   feeding supplement, GLUCERNA SHAKE, (GLUCERNA SHAKE) LIQD [id number] Yes Take 237 mLs by mouth 3 (three) times daily between meals. Nicholas Bar, MD  Active   insulin  aspart (FIASP  FLEXTOUCH) 100 UNIT/ML FlexTouch Pen [id number] Yes Inject 4-8 Units into the skin 3 (three) times daily before meals. McDiarmid, Krystal BIRCH, MD  Active   Insulin  Glargine (BASAGLAR  KWIKPEN) 100 UNIT/ML [id number] Yes Inject 18 Units into the skin daily. McDiarmid, Krystal BIRCH, MD  Active   Insulin  Pen  Needle (BD PEN NEEDLE NANO U/F) 32G X 4 MM MISC [id number] Yes Use as directed once a day Diona Perkins, MD  Active   loperamide (IMODIUM) 2 MG capsule [id number] Yes Take 2 mg by mouth daily as needed for diarrhea or loose stools. [provider]  Active            Med Note MAUDIE, MAUDE KANDICE Debar Jun 07, 2023  3:34 PM) Takes ~4 days weekly  mupirocin  ointment (BACTROBAN ) 2 % [id number] Yes Apply 1 Application topically 2 (two) times daily. Gershon Donnice SAUNDERS, DPM  Active   polyethylene glycol-electrolytes (NULYTELY) 420 g solution [id number]  do 6 oz every 30 mins until the impaction passes, can do 1/2 gallon on first day and 1/2 gallon on 2nd day.  Can continue 2 fleets enemas a day until it resolves.  Patient not taking: Reported on 05/01/2024   Craig Alan SAUNDERS, PA-C  Active   rosuvastatin  (CRESTOR ) 10 MG tablet [id number] Yes Take 1 tablet (10 mg total) by mouth daily. McDiarmid, Krystal BIRCH, MD  Active   triamcinolone  ointment (KENALOG ) 0.5 % [id number] Yes Apply 1 Application topically 2 (two) times daily. McDiarmid, Krystal BIRCH, MD  Active             Recommendation:   PCP Follow-up Continue Current Plan of Care  Follow Up Plan:   Telephone follow up appointment date/time:  07/11/2024 @ 11:30 AM   Olam Ku, RN, BSN [place]  [facility], [facility] Health RN Care Manager Direct Dial : [phone]  Fax: [phone]

## 2024-06-11 NOTE — Patient Instructions (Signed)
 Visit Information  Thank you for taking time to visit with me today. Please don't hesitate to contact me if I can be of assistance to you before our next scheduled appointment.  Your next care management appointment is by telephone on 07/11/2024 at 11:30 AM  Please call the care guide team at [phone] if you need to cancel, schedule, or reschedule an appointment.   Please call the Suicide and Crisis Lifeline: 988 call the [place]  National Suicide Prevention Lifeline: 208-107-5484 or TTY: (905) 826-7477 TTY 808-228-6183) to talk to a trained counselor call 1-800-273-TALK (toll free, 24 hour hotline) if you are experiencing a Mental Health or Behavioral Health Crisis or need someone to talk to.   Olam Ku, RN, BSN [place]  [place], [facility] Health RN Care Manager Direct Dial : [phone]  Fax: [phone]

## 2024-06-19 ENCOUNTER — Ambulatory Visit: Admitting: Family Medicine

## 2024-06-25 ENCOUNTER — Other Ambulatory Visit: Payer: Self-pay | Admitting: Family Medicine

## 2024-06-25 ENCOUNTER — Telehealth: Payer: Self-pay | Admitting: Pharmacist

## 2024-06-25 DIAGNOSIS — Z794 Long term (current) use of insulin: Secondary | ICD-10-CM

## 2024-06-25 NOTE — Telephone Encounter (Signed)
 Patient contacted for follow-up of Diabetes QI and recent missed appt with PCP  Since last contact patient reports he is trying drop primary insurance and use medicaid as primary insurance.  He is having limited help in this process through the [facility] office.   He reports being out of insulin  for 3 days.   Instructed to continue with contact plan for identified medicaid SW and main Medicaid office  Visit schedule with PCP for follow-up 11/13 Needs   Lipid, UACR and BMET     Total time with patient call and documentation of interaction: 21 minutes.

## 2024-06-25 NOTE — Telephone Encounter (Signed)
 Reviewed and agree with Dr Rennis plan.

## 2024-06-26 ENCOUNTER — Other Ambulatory Visit: Payer: Self-pay

## 2024-06-26 DIAGNOSIS — E1165 Type 2 diabetes mellitus with hyperglycemia: Secondary | ICD-10-CM

## 2024-06-26 MED ORDER — FIASP FLEXTOUCH 100 UNIT/ML ~~LOC~~ SOPN
4.0000 [IU] | PEN_INJECTOR | Freq: Three times a day (TID) | SUBCUTANEOUS | 1 refills | Status: DC
Start: 2024-06-26 — End: 2024-07-19

## 2024-07-11 ENCOUNTER — Other Ambulatory Visit: Payer: Self-pay | Admitting: *Deleted

## 2024-07-11 NOTE — Patient Instructions (Signed)
 Visit Information  Mr. Toscano was given information about Medicaid Managed Care team care coordination services as a part of their Healthy [facility] Medicaid benefit. Jkai Arwood   If you would like to schedule transportation through your [employer] [facility] plan, please call the following number at least 2 days in advance of your appointment: [phone]  For information about your ride after you set it up, call Ride Assist at [phone]. Use this number to activate a Will Call pickup, or if your transportation is late for a scheduled pickup. Use this number, too, if you need to make a change or cancel a previously scheduled reservation.  If you need transportation services right away, call [phone]. The after-hours call center is staffed 24 hours to handle ride assistance and urgent reservation requests (including discharges) 365 days a year. Urgent trips include sick visits, hospital discharge requests and life-sustaining treatment.  Call the [facility] Line at [phone], at any time, 24 hours a day, 7 days a week. If you are in danger or need immediate medical attention call 911.   Please see education materials related to Hemoglobin A1C provided by MyChart link.  Care plan and visit instructions communicated with the patient verbally today. Patient agrees to receive a copy in MyChart. Active MyChart status and patient understanding of how to access instructions and care plan via MyChart confirmed with patient.     Telephone follow up appointment with Managed Medicaid care management team member scheduled for: 08/10/2024 @ 11:00 am   Olam Ku, RN, BSN [place]  [facility], [facility] Health RN Care Manager Direct Dial : [phone]  Fax: [phone]

## 2024-07-11 NOTE — Patient Outreach (Addendum)
 Complex Care Management   Visit Note  07/11/2024  Name:  Matthew Lynch MRN: [id number] DOB: Jan 24, 1971  Situation: Referral received for Complex Care Management related to DM II I obtained verbal consent from Patient.  Visit completed with Patient  on the phone  Background:   Past Medical History:  Diagnosis Date   Anxiety    Depression    Diabetes mellitus without complication (HCC)     Assessment: Pt inquired on Section 8 housing. RN provided the contact number for DSS in [facility] and encouraged pt to speak with his case worker for details on his eligibility for voucher expenses related to available house if appropriate. Pt will follow up accordingly. Pt currently has a supportive mother to assist with transportation needs. Patient Reported Symptoms:  Cognitive Cognitive Status: Able to follow simple commands, Alert and oriented to person, place, and time, Normal speech and language skills   Health Maintenance Behaviors: Annual physical exam  Neurological Neurological Review of Symptoms: No symptoms reported Neurological Management Strategies: Medication therapy, Routine screening  HEENT HEENT Symptoms Reported: No symptoms reported HEENT Management Strategies: Routine screening    Cardiovascular Cardiovascular Symptoms Reported: No symptoms reported Does patient have uncontrolled Hypertension?: No Cardiovascular Management Strategies: Routine screening  Respiratory Respiratory Symptoms Reported: No symptoms reported Respiratory Management Strategies: Routine screening  Endocrine Endocrine Symptoms Reported: No symptoms reported Is patient diabetic?: Yes Is patient checking blood sugars at home?: Yes List most recent blood sugar readings, include date and time of day: Reports fasting CBG at 140 this morning with no reported symptoms. Verified last A1C on 05/18/2024 was 7.6 %. Will continue to educate on the prediabetic reads as a goal to continue reducing his A1C  <7. Endocrine Self-Management Outcome: 4 (good)  Gastrointestinal Gastrointestinal Symptoms Reported: Incontinence Gastrointestinal Management Strategies: Incontinence garment/pad Gastrointestinal Comment: Pt has an established GI provider when needed    Genitourinary Genitourinary Symptoms Reported: Incontinence Genitourinary Management Strategies: Incontinence garment/pad Genitourinary Comment: Pt has a Insurance Underwriter when needed  Integumentary Integumentary Symptoms Reported: Rash Other Integumentary Symptoms: History of ongoing rash issues as pt continue to treat with prescribed medications Kenalog . Skin Management Strategies: Routine screening, Medication therapy Skin Self-Management Outcome: 4 (good)  Musculoskeletal Musculoskelatal Symptoms Reviewed: No symptoms reported Musculoskeletal Management Strategies: Routine screening      Psychosocial Psychosocial Symptoms Reported: No symptoms reported           There were no vitals filed for this visit. Pain Scale: 0-10 Pain Score: 0-No pain  Medications Reviewed Today     Reviewed by Alvia Olam BIRCH, RN (Registered Nurse) on 07/11/24 at 1138  Med List Status: <None>   Medication Order Taking? Sig Documenting Provider Last Dose Status Informant  Continuous Glucose Sensor (DEXCOM G7 SENSOR) MISC [id number] Yes 1 Device by Does not apply route as directed. Apply new sensor every 10 days. Nicholas Bar, MD  Active   DULoxetine  (CYMBALTA ) 60 MG capsule [id number] Yes Take 1 capsule (60 mg total) by mouth 2 (two) times daily. Nicholas Bar, MD  Active   feeding supplement, GLUCERNA SHAKE, (GLUCERNA SHAKE) LIQD [id number] Yes Take 237 mLs by mouth 3 (three) times daily between meals. Nicholas Bar, MD  Active   FIASP  FLEXTOUCH 100 UNIT/ML FlexTouch Pen [id number] Yes INJECT 4 TO 7 UNITS SUBCUTANEOUSLY THREE TIMES DAILY BEFORE MEAL(S) Nicholas Bar, MD  Active   insulin  aspart (FIASP  FLEXTOUCH) 100 UNIT/ML FlexTouch Pen [id number] Yes  Inject 4-8 Units into the skin 3 (three) times daily before meals. Nicholas Bar,  MD  Active   Insulin  Glargine (BASAGLAR  KWIKPEN) 100 UNIT/ML [id number] Yes Inject 18 Units into the skin daily. McDiarmid, Krystal BIRCH, MD  Active   Insulin  Pen Needle (BD PEN NEEDLE NANO U/F) 32G X 4 MM MISC [id number] Yes Use as directed once a day Diona Perkins, MD  Active   loperamide (IMODIUM) 2 MG capsule [id number] Yes Take 2 mg by mouth daily as needed for diarrhea or loose stools. [provider]  Active            Med Note MAUDIE, MAUDE KANDICE Debar Jun 07, 2023  3:34 PM) Takes ~4 days weekly  mupirocin  ointment (BACTROBAN ) 2 % [id number] Yes Apply 1 Application topically 2 (two) times daily. Gershon Donnice SAUNDERS, DPM  Active   polyethylene glycol-electrolytes (NULYTELY) 420 g solution [id number]  do 6 oz every 30 mins until the impaction passes, can do 1/2 gallon on first day and 1/2 gallon on 2nd day.  Can continue 2 fleets enemas a day until it resolves.  Patient not taking: Reported on 05/01/2024   Craig Alan SAUNDERS, PA-C  Active   rosuvastatin  (CRESTOR ) 10 MG tablet [id number] Yes Take 1 tablet (10 mg total) by mouth daily. McDiarmid, Krystal BIRCH, MD  Active   triamcinolone  ointment (KENALOG ) 0.5 % [id number] Yes Apply 1 Application topically 2 (two) times daily. McDiarmid, Krystal BIRCH, MD  Active             Recommendation:   PCP Follow-up Continue Current Plan of Care  Follow Up Plan:   Telephone follow up appointment date/time:  08/10/2024 @ 11:00 am   Olam Ku, RN, BSN [place]  [facility], [facility] Health RN Care Manager Direct Dial : [phone]  Fax: [phone]

## 2024-07-12 ENCOUNTER — Ambulatory Visit: Admitting: Family Medicine

## 2024-07-12 ENCOUNTER — Encounter: Payer: Self-pay | Admitting: Family Medicine

## 2024-07-12 VITALS — BP 112/77 | HR 110 | Ht 65.0 in | Wt 124.4 lb

## 2024-07-12 DIAGNOSIS — F32A Depression, unspecified: Secondary | ICD-10-CM | POA: Diagnosis not present

## 2024-07-12 DIAGNOSIS — Z794 Long term (current) use of insulin: Secondary | ICD-10-CM | POA: Diagnosis not present

## 2024-07-12 DIAGNOSIS — E1165 Type 2 diabetes mellitus with hyperglycemia: Secondary | ICD-10-CM | POA: Diagnosis present

## 2024-07-12 DIAGNOSIS — F419 Anxiety disorder, unspecified: Secondary | ICD-10-CM | POA: Diagnosis not present

## 2024-07-12 DIAGNOSIS — Z87891 Personal history of nicotine dependence: Secondary | ICD-10-CM | POA: Diagnosis not present

## 2024-07-12 MED ORDER — BASAGLAR KWIKPEN 100 UNIT/ML ~~LOC~~ SOPN: 12.0000 [IU] | PEN_INJECTOR | Freq: Every day | SUBCUTANEOUS | 1 refills | Status: AC

## 2024-07-12 MED ORDER — BASAGLAR KWIKPEN 100 UNIT/ML ~~LOC~~ SOPN: 14.0000 [IU] | PEN_INJECTOR | Freq: Every day | SUBCUTANEOUS | 1 refills | Status: DC

## 2024-07-12 NOTE — Assessment & Plan Note (Signed)
 Experienced nocturnal hypoglycemia due to increased activity and patient difficulty knowing how much preprandial insulin  to take. Now has access to insulin .  - Reduce basaglar  from 18 u to 12 u, Continue same preprandial insulin  dose  - Referred to dietitian for carbohydrate counting and insulin -to-carbohydrate ratio education. - Ordered labs for kidney function and cholesterol. - Scheduled follow-up with Dr. Koval for insulin  regimen review with CGM data

## 2024-07-12 NOTE — Progress Notes (Addendum)
   SUBJECTIVE:   CHIEF COMPLAINT / HPI:  Discussed the use of AI scribe software for clinical note transcription with the patient, who gave verbal consent to proceed.  History of Present Illness Matthew Lynch is a 53 year old male with diabetes who presents with concerns about insulin  management and recent hypoglycemic episodes.  Glycemic control and insulin  management - Diabetes mellitus with recent concerns regarding insulin  regimen and hypoglycemic episodes - Resumed regular insulin  regimen after a seven-day lapse due to insurance issues; now has Medicaid coverage - before lapse, used insulin  sparingly to stretch out the amount he had  - Current regimen: 18 units Basaglar  in the morning, 4 to 8 units short-acting insulin  before meals - Feels like he does not have a good idea of how to count carbs and know how much insulin  to give himself with meals.  - No nausea or vomiting  - Has some chronic abdominal discomfort that has not changed. Has stayed stable. He is able to eat. Consistent stools.   Hypoglycemia - Nocturnal hypoglycemic episodes with blood glucose dropping below 40 mg/dL - Episodes occurred four times in the past week, between 2 to 5 AM  Tobacco use cessation - Quit smoking completely on February 27, 2024, after a brief relapse - Improved health and exercise capacity since cessation - Feels better since cessation.   Depression and Anxiety  Patient feels like his mood symptoms are due to feeling like he can't provide for his family or take care of himself. He does not feel like he has a mood disorder. He does not want to take medications.     PERTINENT  PMH / PSH: T2DM  OBJECTIVE:  BP 112/77   Pulse (!) 110   Ht 5' 5 (1.651 m)   Wt 124 lb 6.4 oz (56.4 kg)   SpO2 98%   BMI 20.70 kg/m    General: Thin, well appearing, in no acute distress CV: RRR, radial pulses equal and palpable, no BLE edema  Resp: Normal work of breathing on room air, CTAB Abd: Soft, non  tender, non distended  Neuro: Alert & Oriented x 4  Psyc: pleasant, normal affect, conversant   ASSESSMENT/PLAN:   Assessment & Plan Type 2 diabetes mellitus with hyperglycemia, with long-term current use of insulin  (HCC) Experienced nocturnal hypoglycemia due to increased activity and patient difficulty knowing how much preprandial insulin  to take. Now has access to insulin .  - Reduce basaglar  from 18 u to 12 u, Continue same preprandial insulin  dose  - Referred to dietitian for carbohydrate counting and insulin -to-carbohydrate ratio education. - Ordered labs for kidney function and cholesterol. - Scheduled follow-up with Dr. Koval for insulin  regimen review with CGM data  Anxiety and depression Still has PHQ 9 score of 18 significant mood symptoms related to his chronic illnesses and ability to provide for his family; however, his symptoms have significantly improved from months ago. No SI. Patient on duloxetine  for pain management, but has possible helped his mood disorder a little bit  - Discussed medication management which patient declined.  - patient agreed to call a therapist, provided patient with resources   Former tobacco use Great news that patient has been able to continue his cessation of tobacco use.  - Continue to monitor and offered to be a continued resource for him if he has any cravings or issues.   Patient declined vaccines     Areta Saliva, MD [facility]

## 2024-07-12 NOTE — Patient Instructions (Addendum)
 It was wonderful to see you today.  Please bring ALL of your medications with you to every visit.   Today we talked about:  Diabetes - I am glad you have insulin  supply again. Please reduce your basaglar  (long acting insulin ) from 18 units to 12 units. We want to avoid any low or hypoglycemic episodes. I have also referred you to a dietician to discuss carb counting.   I am so proud of you for quitting smoking!   For your mood I highly recommend talking to a therapist. I have attached a list below.   You have a follow up with Dr. Koval.   Otherwise follow up with me in 1 month or sooner if something comes up   Thank you for choosing [facility].   Please call [phone] with any questions about today's appointment.  Areta Saliva, MD  Family Medicine     Therapy and Counseling Resources Most providers on this list will take Medicaid. Patients with commercial insurance or Medicare should contact their insurance company to get a list of in network providers.  [facility] (takes children) Location 1: [address] [place], [place] [address] Location 2: [address] [place], [place] [address] [phone]   [facility] (spanish speaking therapist available)(habla espanol)(take medicare and medicaid)  [address], [place], [place] [address], [place]  [email or web address] [phone]  [facility] [address] [place], [place] [address] [phone]  [facility]   [address], Suite [place], [place] [address]      [phone]  [facility] (spanish available) [address]  [place], [place] [address] [phone]  [facility] (take [facility] and medicare) [address]  [place], [place] [address]       [phone]     [facility] (virtual only) [phone]  [facility] [address],  [place], [place] [phone]  [facility]:  [address] [place] [phone]  [facility]:  [address] [place] [phone]  [facility] (under & uninsured) [address]   [place] [phone]    [email or web address]    [facility] [address]  [place]    [phone]  [facility] -[address]     Phone:  [phone]     [facility]-  [address]  [phone]   [facility] [address]      [place], [place] [phone]  [facility]: [address] [place], [place], [place]  [phone]   [facility] (Spanish therapist) [email or web address]  [address] [place]  [address]   [place] [address]    [phone]    [facility]   [address],  [place], [place]  [phone]   [facility]  [address] [place]  [phone]  [facility]  [address] [place], [place]        [phone]  Open Access/Walk In Clinic under & uninsured  [facility]  [address] [place], [place] [phone] Crisis [phone]  [facility] [address],  (Spanish)   [address] , [place]: [phone]) 8:30 - 12; 1 - 2:30  [facility] [employer],  [address] [place], [place]    (  [phone]):8:30 - 12; 2 - 3PM  [facility] [address]  [address],  [place]; [phone]):   Mon - Fri 8 AM - 5 PM  [facility] [address] [place]  MWF 12:30 to 3:00 or call to schedule an appointment  [phone]  Specific Provider options Psychology Today  [email or web address]  click on find a therapist  enter your zip code left side and select or tailor a therapist for your specific need.   [facility] Provider  Directory [email or web address]  (Medicaid)   Follow all drop down to find a provider  Social Support program [facility] [place] [phone] or photosolver.pl [address], [place], [place] Recovery support and educational   24- Hour Availability:   [facility]  [address] [place], [place] [phone] Crisis [phone]  [facility] [phone]  [facility]  [phone]   [facility]  [phone] (after hours)  Therapeutic Alternative/Mobile Crisis   [phone]  [place]  National Suicide Hotline  702-529-3581 MERRILYN)  Call 911 or go to emergency room  [facility]  [phone]);  [place]  [phone]); [place], [place], [place], [place], [place], [place], [place]

## 2024-07-13 LAB — LIPID PANEL
Chol/HDL Ratio: 4.4 ratio (ref 0.0–5.0)
Cholesterol, Total: 252 mg/dL — ABNORMAL HIGH (ref 100–199)
HDL: 57 mg/dL (ref >39)
LDL Chol Calc (NIH): 172 mg/dL — ABNORMAL HIGH (ref 0–99)
Triglycerides: 126 mg/dL (ref 0–149)
VLDL Cholesterol Cal: 23 mg/dL (ref 5–40)

## 2024-07-13 LAB — BASIC METABOLIC PANEL WITH GFR
BUN/Creatinine Ratio: 10 (ref 9–20)
BUN: 11 mg/dL (ref 6–24)
CO2: 25 mmol/L (ref 20–29)
Calcium: 10.1 mg/dL (ref 8.7–10.2)
Chloride: 94 mmol/L — ABNORMAL LOW (ref 96–106)
Creatinine, Ser: 1.15 mg/dL (ref 0.76–1.27)
Glucose: 237 mg/dL — ABNORMAL HIGH (ref 70–99)
Potassium: 4.2 mmol/L (ref 3.5–5.2)
Sodium: 135 mmol/L (ref 134–144)
eGFR: 77 mL/min/1.73 (ref >59)

## 2024-07-13 LAB — MICROALBUMIN / CREATININE URINE RATIO
Creatinine, Urine: 565.7 mg/dL
Microalb/Creat Ratio: 42 mg/g{creat} — ABNORMAL HIGH (ref 0–29)
Microalbumin, Urine: 237.5 ug/mL

## 2024-07-14 NOTE — Assessment & Plan Note (Signed)
 Great news that patient has been able to continue his cessation of tobacco use.  - Continue to monitor and offered to be a continued resource for him if he has any cravings or issues.

## 2024-07-14 NOTE — Assessment & Plan Note (Signed)
 Still has PHQ 9 score of 18 significant mood symptoms related to his chronic illnesses and ability to provide for his family; however, his symptoms have significantly improved from months ago. No SI. Patient on duloxetine  for pain management, but has possible helped his mood disorder a little bit  - Discussed medication management which patient declined.  - patient agreed to call a therapist, provided patient with resources

## 2024-07-15 NOTE — Addendum Note (Signed)
 Addended by: Christpher Stogsdill on: 07/15/2024 12:25 AM   Modules accepted: Level of Service

## 2024-07-19 ENCOUNTER — Encounter: Payer: Self-pay | Admitting: Pharmacist

## 2024-07-19 ENCOUNTER — Ambulatory Visit (INDEPENDENT_AMBULATORY_CARE_PROVIDER_SITE_OTHER): Admitting: Pharmacist

## 2024-07-19 VITALS — BP 116/71 | HR 93 | Wt 127.0 lb

## 2024-07-19 DIAGNOSIS — E1140 Type 2 diabetes mellitus with diabetic neuropathy, unspecified: Secondary | ICD-10-CM

## 2024-07-19 DIAGNOSIS — Z87891 Personal history of nicotine dependence: Secondary | ICD-10-CM

## 2024-07-19 DIAGNOSIS — E785 Hyperlipidemia, unspecified: Secondary | ICD-10-CM

## 2024-07-19 DIAGNOSIS — E1165 Type 2 diabetes mellitus with hyperglycemia: Secondary | ICD-10-CM

## 2024-07-19 MED ORDER — ROSUVASTATIN CALCIUM 40 MG PO TABS: 40.0000 mg | ORAL_TABLET | Freq: Every day | ORAL | 6 refills | Status: AC

## 2024-07-19 MED ORDER — FIASP FLEXTOUCH 100 UNIT/ML ~~LOC~~ SOPN: 3.0000 [IU] | PEN_INJECTOR | Freq: Three times a day (TID) | SUBCUTANEOUS | Status: AC

## 2024-07-19 NOTE — Assessment & Plan Note (Signed)
 Diabetes longstanding currently controlled. Most recent GMI 7.2%. Patient is able to verbalize appropriate hypoglycemia management plan. Medication adherence appears poor-fair. Discussed with patient on the potential of decreasing Basaglar  (insulin  glargine) to avoid low blood sugar episodes but he refused and believed that the current insulin  regimen is helping him. He has been decreasing meal time insulin  to 3-5 units per patient preference. -Continued basal insulin  Basaglar  (insulin  glargine) 12 units daily -Continued rapid insulin  Fiasp  (insulin  aspart) 3-5 units TID -Extensively discussed pathophysiology of diabetes, recommended lifestyle interventions, dietary effects on blood sugar control.  -Next A1c anticipated January, 2026.

## 2024-07-19 NOTE — Patient Instructions (Addendum)
 It was nice to see you today!  Your goal blood sugar is 80-130 before eating and less than 180 after eating.  Medication Changes:  Increase rosuvastatin  dose to 40 mg daily   Continue all other medication the same.   Keep up the good work with diet and exercise. Aim for a diet full of vegetables, fruit and lean meats (chicken, turkey, fish). Try to limit salt intake by eating fresh or frozen vegetables (instead of canned), rinse canned vegetables prior to cooking and do not add any additional salt to meals.

## 2024-07-19 NOTE — Progress Notes (Signed)
 S:     Chief Complaint  Patient presents with   Medication Management    DM follow-up    53 y.o. male who presents for diabetes evaluation, education, and management. Patient arrives in good spirits and presents without any assistance.   Patient was referred and last seen by Primary Care Provider, Dr. Nicholas, on 04/02/24.  PMH is significant for T2DM, hyperlipidemia, depression, underweight, pelvic pain, former tobacco use.  Family/Social History: Risk of homelessness - lacks permanent home/resources.   Current diabetes medications include: Fiasp  (insulin  aspart) 3-5 units TID, insulin  glargine 12 units daily  Current hypertension medications include: none Current hyperlipidemia medications include: rosuvastatin  10 mg (not adherent)  Patient denies adherence to taking all medications as prescribed. He has been trying to wean off his medications since he thinks he has to take too many of them. Reported taking duloxetine  PRN, around 1 time per day and skipping his rosuvastatin  doses.   Patient reports hypoglycemic events. He thinks the lows are due to he wants to keep the blood sugar number low.   Patient reports nocturia (nighttime urination). Usually 2-3 times per night for defecation. Bigger meals worsen the diarrhea. Patient suspects that it could be due to oily and fried foods.    Patient reported dietary habits: Eats 5-6 small meals/day  Patient-reported exercise habits: Walking around the house with 5lbs weights, raking yard daily. He has been staying active throughout the day with his family.   O:   Review of Systems  All other systems reviewed and are negative.  Physical Exam Constitutional:      Appearance: Normal appearance.  Pulmonary:     Effort: Pulmonary effort is normal.  Neurological:     Mental Status: He is alert.  Psychiatric:        Mood and Affect: Mood normal.        Behavior: Behavior normal.        Thought Content: Thought content normal.         Judgment: Judgment normal.    Libre3 CGM Download today 07/06/24-07/19/24 % Time CGM is active: 91.8% Average Glucose: 162 mg/dL Glucose Management Indicator: 7.2%  Glucose Variability: 37.2% (goal <36%) Time in Goal:  - Time in range 70-180: 63% - Time above range: 33% - Time below range: 4%  Lab Results  Component Value Date   HGBA1C 7.6 (A) 05/18/2024   Vitals:   07/19/24 1337  BP: 116/71  Pulse: 93  SpO2: 99%    Lipid Panel     Component Value Date/Time   CHOL 252 (H) 07/12/2024 1400   TRIG 126 07/12/2024 1400   HDL 57 07/12/2024 1400   CHOLHDL 4.4 07/12/2024 1400   CHOLHDL 5.2 01/25/2020 1308   VLDL 54 (H) 01/25/2020 1308   LDLCALC 172 (H) 07/12/2024 1400    Clinical Atherosclerotic Cardiovascular Disease (ASCVD): No  The 10-year ASCVD risk score (Arnett DK, et al., 2019) is: 9%   Values used to calculate the score:     Age: 33 years     Clincally relevant sex: Male     Is Non-Hispanic African American: Yes     Diabetic: Yes     Tobacco smoker: No     Systolic Blood Pressure: 116 mmHg     Is BP treated: No     HDL Cholesterol: 57 mg/dL     Total Cholesterol: 252 mg/dL   A/P: Diabetes longstanding currently controlled. Most recent GMI 7.2%. Patient is able to verbalize appropriate hypoglycemia  management plan. Medication adherence appears poor-fair. Discussed with patient on the potential of decreasing Basaglar  (insulin  glargine) to avoid low blood sugar episodes but he refused and believed that the current insulin  regimen is helping him. He has been decreasing meal time insulin  to 3-5 units per patient preference. -Continued basal insulin  Basaglar  (insulin  glargine) 12 units daily -Continued rapid insulin  Fiasp  (insulin  aspart) 3-5 units TID -Extensively discussed pathophysiology of diabetes, recommended lifestyle interventions, dietary effects on blood sugar control.  -Next A1c anticipated January, 2026.   ASCVD risk - primary prevention in patient with  diabetes. Last LDL is 172, not at goal of <70  mg/dL. ASCVD risk factors include T2DM, HLD and 10-year ASCVD risk score of 14.2%. high intensity statin indicated. Patient denied adherence to rosuvastatin  10 mg since he wants to reduce his medication intake.  -Increased dose of rosuvastatin  to 40 mg.  -Extensively educated patient on the importance of statin and his lipid management. Patient demonstrated understanding and agreeable to take the increased dose of rosuvastatin .   Written patient instructions provided. Patient verbalized understanding of treatment plan.  Total time in face to face counseling 35 minutes.    Follow-up:  PCP clinic visit in 09/07/2024 Patient seen with Lawson Mao, PharmD Candidate - PY3 student and [employer] PharmD - PY4 Candidate.

## 2024-07-19 NOTE — Assessment & Plan Note (Signed)
 History of tobacco use disorder.  Quit in late June.  Relapsed for a single weekend and now reports continued abstinence for ~ 2 weeks.  Reports that he is confident in complete abstinence in the future.   - Congratulated and encouraged continued success.

## 2024-07-19 NOTE — Assessment & Plan Note (Signed)
 ASCVD risk - primary prevention in patient with diabetes. Last LDL is 172, not at goal of <70  mg/dL. ASCVD risk factors include T2DM, HLD and 10-year ASCVD risk score of 14.2%. high intensity statin indicated. Patient denied adherence to rosuvastatin  10 mg since he wants to reduce his medication intake.  -Increased dose of rosuvastatin  to 40 mg.  -Extensively educated patient on the importance of statin and his lipid management. Patient demonstrated understanding and agreeable to take the increased dose of rosuvastatin .

## 2024-07-20 NOTE — Progress Notes (Signed)
 Reviewed and agree with Dr Rennis plan.

## 2024-08-10 ENCOUNTER — Telehealth: Payer: Self-pay | Admitting: *Deleted

## 2024-08-10 ENCOUNTER — Encounter: Payer: Self-pay | Admitting: *Deleted

## 2024-08-10 NOTE — Patient Instructions (Signed)
 [name] - I am sorry I was unable to reach you today for our scheduled appointment. I work with Nicholas Bar, MD and am calling to support your healthcare needs. Please contact me at [phone] at your earliest convenience. I look forward to speaking with you soon.   Thank you,   Olam Ku, RN, BSN [place]  [facility], [facility] Health RN Care Manager Direct Dial : [phone]  Fax: [phone]

## 2024-08-15 ENCOUNTER — Telehealth: Payer: Self-pay | Admitting: *Deleted

## 2024-08-15 ENCOUNTER — Encounter: Payer: Self-pay | Admitting: *Deleted

## 2024-08-15 NOTE — Patient Outreach (Signed)
 Complex Care Management   Visit Note  08/15/2024  Name:  Matthew Lynch MRN: [id number] DOB: 08/31/1970  Situation: RNCM spoke beliefly with the pt for the scheduled appointment for today. Pt verified this was not a good time as he was in another appointment and requested to reschedule today's appointment.   RNCM will reschedule as requested and pt indicated he would check his MyChart for the rescheduled appointment.  Follow Up Plan:   Telephone follow up appointment date/time:  08/17/2024 @ 2:00 PM   Olam Ku, RN, BSN [place]  [facility], [facility] RN Care Manager Direct Dial : [phone]  Fax: [phone]

## 2024-08-17 ENCOUNTER — Telehealth: Admitting: *Deleted

## 2024-08-17 ENCOUNTER — Other Ambulatory Visit: Payer: Self-pay | Admitting: *Deleted

## 2024-08-17 NOTE — Patient Instructions (Signed)
 Visit Information  Thank you for taking time to visit with me today. Please don't hesitate to contact me if I can be of assistance to you before our next scheduled appointment.  Your next care management appointment is by telephone on 09/13/2024 at 1:00 pm  Please call the care guide team at [phone] if you need to cancel, schedule, or reschedule an appointment.   Please call the Suicide and Crisis Lifeline: 988 call the [place]  National Suicide Prevention Lifeline: 734-297-0370 or TTY: (929)349-8666 TTY [phone]) to talk to a trained counselor call 1-800-273-TALK (toll free, 24 hour hotline) if you are experiencing a Mental Health or Behavioral Health Crisis or need someone to talk to.   Olam Ku, RN, BSN [place]  [facility], [facility] Health RN Care Manager Direct Dial : [phone]  Fax: [phone]

## 2024-08-17 NOTE — Patient Outreach (Signed)
 Complex Care Management   Visit Note  08/17/2024  Name:  Matthew Lynch MRN: [id number] DOB: November 07, 1970  Situation: Referral received for Complex Care Management related to DMII I obtained verbal consent from Patient.  Visit completed with Patient  on the phone  Background:   Past Medical History:  Diagnosis Date   Anxiety    Depression    Diabetes mellitus without complication (HCC)     Assessment: Patient Reported Symptoms:  Cognitive Cognitive Status: Able to follow simple commands, Alert and oriented to person, place, and time, Normal speech and language skills   Health Maintenance Behaviors: Annual physical exam Healing Pattern: Slow Health Facilitated by: Rest  Neurological Neurological Review of Symptoms: No symptoms reported Neurological Management Strategies: Routine screening Neurological Self-Management Outcome: 4 (good)  HEENT HEENT Symptoms Reported: No symptoms reported HEENT Management Strategies: Routine screening HEENT Self-Management Outcome: 4 (good)    Cardiovascular Cardiovascular Symptoms Reported: No symptoms reported Does patient have uncontrolled Hypertension?: No Cardiovascular Management Strategies: Routine screening Cardiovascular Self-Management Outcome: 4 (good) Cardiovascular Comment: Pt reports he stopped smoking in June 2025.  Respiratory Respiratory Symptoms Reported: No symptoms reported Respiratory Management Strategies: Routine screening, Coping strategies Respiratory Self-Management Outcome: 4 (good)  Endocrine Endocrine Symptoms Reported: No symptoms reported Is patient diabetic?: Yes Is patient checking blood sugars at home?: Yes List most recent blood sugar readings, include date and time of day: Reports fasting CBG at 176 this morning and pt remains asymptomatic. Dexcom currently reports A1c 7.8% with last office visit lab indicating 7.6% on 05/18/2024 Endocrine Self-Management Outcome: 4 (good)  Gastrointestinal Gastrointestinal  Symptoms Reported: Incontinence Gastrointestinal Management Strategies: Incontinence garment/pad Gastrointestinal Self-Management Outcome: 4 (good) Gastrointestinal Comment: Pt states he has a GI provider    Genitourinary Genitourinary Symptoms Reported: Incontinence Genitourinary Management Strategies: Incontinence garment/pad Genitourinary Self-Management Outcome: 4 (good) Genitourinary Comment: Urologist available when needed  Integumentary Integumentary Symptoms Reported: No symptoms reported Other Integumentary Symptoms: History of ongoing rash issues as pt continue to treat with prescribed medications Kenalog . Skin Management Strategies: Coping strategies, Medication therapy, Routine screening Skin Self-Management Outcome: 4 (good) Skin Comment: Use of his Kenalog  (prescribed medication)  Musculoskeletal Musculoskelatal Symptoms Reviewed: Back pain Additional Musculoskeletal Details: Pt takes Tylenol  when needed with good relief however chronic. Musculoskeletal Management Strategies: Coping strategies, Routine screening Musculoskeletal Self-Management Outcome: 4 (good)      Psychosocial Psychosocial Symptoms Reported: No symptoms reported Behavioral Management Strategies: Coping strategies, Support system Behavioral Health Self-Management Outcome: 4 (good) Major Change/Loss/Stressor/Fears (CP): Denies Quality of Family Relationships: helpful, involved, supportive Do you feel physically threatened by others?: No     There were no vitals filed for this visit. Pain Scale: 0-10 Pain Score: 0-No pain  Medications Reviewed Today     Reviewed by Alvia Olam BIRCH, RN (Registered Nurse) on 08/17/24 at 1406  Med List Status: <None>   Medication Order Taking? Sig Documenting Provider Last Dose Status Informant  Continuous Glucose Sensor (DEXCOM G7 SENSOR) MISC [id number] Yes 1 Device by Does not apply route as directed. Apply new sensor every 10 days. Nicholas Bar, MD  Active    DULoxetine  (CYMBALTA ) 60 MG capsule [id number] Yes Take 1 capsule (60 mg total) by mouth 2 (two) times daily. Nicholas Bar, MD  Active   feeding supplement, GLUCERNA SHAKE, (GLUCERNA SHAKE) LIQD [id number] Yes Take 237 mLs by mouth 3 (three) times daily between meals. Nicholas Bar, MD  Active   insulin  aspart (FIASP  FLEXTOUCH) 100 UNIT/ML FlexTouch Pen [id number] Yes Inject 3-5 Units into the skin  3 (three) times daily before meals. McDiarmid, Krystal BIRCH, MD  Active   Insulin  Glargine (BASAGLAR  [facility]) 100 UNIT/ML [id number] Yes Inject 12 Units into the skin daily. Nicholas Bar, MD  Active   Insulin  Pen Needle (BD PEN NEEDLE NANO U/F) 32G X 4 MM MISC [id number] Yes Use as directed once a day Diona Perkins, MD  Active   loperamide (IMODIUM) 2 MG capsule [id number] Yes Take 2 mg by mouth daily as needed for diarrhea or loose stools. [provider]  Active            Med Note MAUDIE MAUDE KANDICE Sonjia Jun 07, 2023  3:34 PM) Takes ~4 days weekly  meloxicam (MOBIC) 15 MG tablet [id number] Yes Take 15 mg by mouth daily. [provider]  Active   mupirocin  ointment (BACTROBAN ) 2 % [id number] Yes Apply 1 Application topically 2 (two) times daily. Gershon Donnice SAUNDERS, DPM  Active   polyethylene glycol-electrolytes (NULYTELY) 420 g solution [id number]  do 6 oz every 30 mins until the impaction passes, can do 1/2 gallon on first day and 1/2 gallon on 2nd day.  Can continue 2 fleets enemas a day until it resolves.  Patient not taking: Reported on 07/19/2024   Craig Alan SAUNDERS, PA-C  Active   rosuvastatin  (CRESTOR ) 40 MG tablet [id number] Yes Take 1 tablet (40 mg total) by mouth daily. McDiarmid, Krystal BIRCH, MD  Active   tadalafil (CIALIS) 5 MG tablet [id number] Yes Take 5 mg by mouth daily. [provider]  Active   triamcinolone  ointment (KENALOG ) 0.5 % [id number] Yes Apply 1 Application topically 2 (two) times daily. McDiarmid, Krystal BIRCH, MD  Active             Recommendation:   PCP  Follow-up Continue Current Plan of Care  Follow Up Plan:   Telephone follow up appointment date/time:  09/13/2024 @ 1:00 pm   Olam Ku, RN, BSN [place]  [facility], [facility] RN Care Manager Direct Dial : [phone]  Fax: [phone]

## 2024-09-07 ENCOUNTER — Ambulatory Visit: Admitting: Family Medicine

## 2024-09-07 ENCOUNTER — Telehealth: Payer: Self-pay | Admitting: Family Medicine

## 2024-09-07 ENCOUNTER — Encounter: Payer: Self-pay | Admitting: Family Medicine

## 2024-09-07 VITALS — BP 114/75 | HR 96 | Ht 65.0 in | Wt 122.8 lb

## 2024-09-07 DIAGNOSIS — E1140 Type 2 diabetes mellitus with diabetic neuropathy, unspecified: Secondary | ICD-10-CM | POA: Diagnosis present

## 2024-09-07 DIAGNOSIS — R61 Generalized hyperhidrosis: Secondary | ICD-10-CM

## 2024-09-07 DIAGNOSIS — R0781 Pleurodynia: Secondary | ICD-10-CM

## 2024-09-07 LAB — GLUCOSE, POCT (MANUAL RESULT ENTRY): POC Glucose: 38 mg/dL — AB (ref 70–99)

## 2024-09-07 LAB — POCT GLYCOSYLATED HEMOGLOBIN (HGB A1C): HbA1c, POC (controlled diabetic range): 7.9 % — AB (ref 0.0–7.0)

## 2024-09-07 MED ORDER — GLUCOSE 40 % PO GEL
1.0000 | Freq: Once | ORAL | Status: AC
  Administered 2024-09-07: 31 g via ORAL

## 2024-09-07 NOTE — Assessment & Plan Note (Signed)
 Suboptimal diabetes management with recent hypoglycemic episodes.  While patient was getting labs he had a hypoglycemic episode down to 38.  This improved with glucose gel and snacks.  A1c increased to 7.9%. Neuropathic pain likely due to diabetic neuropathy  - Decreased long-acting insulin  to 10 units daily - Advised to not fast and have very regular meals.  Advised patient that his A1c most likely went up as he has been sometimes skipping insulin  and also sometimes fasting and then eating great amounts of food later thus actually making his disease process worse - Instructed to monitor blood glucose levels closely. - Scheduled follow-up with Doctor Koval in one month to review CGM data. - Scheduled follow-up with primary care in two months.

## 2024-09-07 NOTE — Patient Instructions (Addendum)
" °  VISIT SUMMARY: During your visit, we discussed your diabetes management, chest pain, back pain, and lipid management. We reviewed your recent glucose levels and insulin  regimen, and addressed your chronic pain and depression. We also discussed your current medications and made some adjustments to improve your overall health.  YOUR PLAN: -TYPE 2 DIABETES MELLITUS WITH DIABETIC NEUROPATHY: This condition means that your body has difficulty managing blood sugar levels, which can lead to nerve damage. We have decreased your long-acting insulin  to 12 units daily and advised you to not do longer than 12 hours of fasting. Do not take short acting insulin  when fasting.  Please monitor your blood glucose levels closely and maintain a consistent meal schedule. We have ordered thyroid  function tests and a blood test to screen for pulmonary embolism given your discomfort with deep breathing. Follow up with Doctor Koval in one month to review your CGM data and with your primary care doctor in three months. Please take your duloxetine  consistently.  Try to stop taking meloxicam if you are not taking it consistently and do not need it.   Follow up with Dr. Koval in one month and with me in 2 months.  "

## 2024-09-07 NOTE — Telephone Encounter (Signed)
 Received after hours page from [facility] regarding a stat lab (d-dimer) that was ordered in clinic today.  The result was a D-dimer of less than 0.20 which is negative.  Will route to ordering physician.

## 2024-09-07 NOTE — Progress Notes (Signed)
 "  SUBJECTIVE:   CHIEF COMPLAINT / HPI:  Discussed the use of AI scribe software for clinical note transcription with the patient, who gave verbal consent to proceed.  History of Present Illness Matthew Lynch is a 54 year old male with diabetes who presents for a follow-up visit regarding his diabetes management.  Glycemic control and insulin  management - Continuous glucose monitor (CGM) on July 19, 2024 showed glucose management indicator of 7.2% with 63% time in range and no hypoglycemia.  But some percentage below the range.  At that time patient did not want to decrease his long-acting insulin  dose but did decrease his short acting insulin . - Over the past two months since his last appointment, approximately four hypoglycemic episodes with CGM readings as low as 40, associated with prolonged fasting up to two days.  Patient had been prescribed 12 units of long-acting insulin  daily however had increased his long-acting insulin  to 16 units daily and was doing this regimen while also fasting for up to 2 days.  He had increased his insulin  dose since he said he had elevated blood sugars of 180 overnight and not decreasing with exercise or other means. - A1c increased from 7.6 to 7.9 since last visit. - Restricting meat and carbohydrates, focusing on herbs, fruits, and vegetables.  Has been fasting and reducing his food intake not due to food insecurity but due to desire to improve his diabetes.  Was not instructed to fast. - Patient says that has been wanting to taper off of all of his medications because he wants to be over this and better from his disease processes.  Back pain, burning with respiration - Endorses burning back pain right below his shoulder blades with deep respiration occasionally.  No shortness of breath or worsening chest pain with exertion.  Has not been sick recently.  Has been going on for the past month.  No long trips recently no surgeries recently. -Does not take  Cymbalta  as prescribed and takes it as needed.  Has noticed that his neuropathic symptoms get worse when he takes it as needed and has also acknowledged that he should take it as prescribed.  Night sweats - Night sweats and sweating of chest and back at night. - Glucose during these episodes typically 70-80. No fevers, weight loss, fatigue  Lipid management - Rosuvastatin  recently increased to 40 mg after prior nonadherence at 10 mg and elevated lipid panel - Ten-year ASCVD risk score was 14.2%. -Reports adherence to this  Depression Has been taking his Cymbalta  intermittently.  Says his depression is controlled his started seeing a therapist for his depression and has found this useful.    PERTINENT  PMH / PSH: T2DM on insulin    OBJECTIVE:  BP 114/75   Pulse 96   Ht 5' 5 (1.651 m)   Wt 122 lb 12.8 oz (55.7 kg)   SpO2 100%   BMI 20.43 kg/m   General: well appearing, in no acute distress CV: RRR, radial pulses equal and palpable, no BLE edema  Resp: Normal work of breathing on room air, CTAB Abd: Soft, non tender, non distended  Neuro: Alert & Oriented x 4   ASSESSMENT/PLAN:   Assessment & Plan Type 2 diabetes mellitus with diabetic neuropathy, without long-term current use of insulin  (HCC) Suboptimal diabetes management with recent hypoglycemic episodes.  While patient was getting labs he had a hypoglycemic episode down to 38.  This improved with glucose gel and snacks.  A1c increased to 7.9%. Neuropathic pain  likely due to diabetic neuropathy  - Decreased long-acting insulin  to 10 units daily - Advised to not fast and have very regular meals.  Advised patient that his A1c most likely went up as he has been sometimes skipping insulin  and also sometimes fasting and then eating great amounts of food later thus actually making his disease process worse - Instructed to monitor blood glucose levels closely. - Scheduled follow-up with Doctor Koval in one month to review CGM  data. - Scheduled follow-up with primary care in two months. Night sweats Most likely due to hypoglycemic episodes.  No B symptoms. - Continue to monitor symptoms - Reduced daily insulin  - TSH Pleuritic pain Burning back pain/pleuritic pain less likely PE given patient does not have risk factors however cannot rule out at this time.  Wells score for PE is 0.  Most likely a type of neuropathic pain given the burning in nature.  Patient could also have fibromyalgia given he has burning and pain in multiple different locations as well as mood disorder.  Did not have chest pain or shortness of breath during the visit. -Continue with Cymbalta  twice daily - D-dimer to rule out PE - Gave patient ED precautions for worsening pleuritic pain, shortness of breath, chest pain.      Areta Saliva, MD [facility] "

## 2024-09-08 ENCOUNTER — Ambulatory Visit: Payer: Self-pay | Admitting: Family Medicine

## 2024-09-08 LAB — D-DIMER, QUANTITATIVE: D-DIMER: <0.2 mg{FEU}/L (ref 0.00–0.49)

## 2024-09-08 LAB — TSH RFX ON ABNORMAL TO FREE T4: TSH: 1.25 u[IU]/mL (ref 0.450–4.500)

## 2024-09-08 NOTE — Telephone Encounter (Signed)
 Called patient about lab results. Informed him that d dimer was negative and TSH was wnl. Encouraged him to continue his duloxetine .  He assured me he was not fasting and his sugars were running a little high. I told him we would prefer that he was a little high and we adjust his insulin  than him having repeated hypoglycemic episodes. He agreed and had no further questions.

## 2024-09-13 ENCOUNTER — Other Ambulatory Visit: Payer: Self-pay

## 2024-09-17 NOTE — Patient Outreach (Signed)
 Complex Care Management   Visit Note  09/17/2024  Name:  Matthew Lynch MRN: [id number] DOB: May 04, 1971  Situation: Referral received for Complex Care Management related to DM I obtained verbal consent from Patient.  Visit completed with Matthew Lynch  on the phone  Background:   Past Medical History:  Diagnosis Date   Anxiety    Depression    Diabetes mellitus without complication (HCC)     Assessment: Patient Reported Symptoms:  Cognitive Cognitive Status: Alert and oriented to person, place, and time, Normal speech and language skills      Neurological Neurological Review of Symptoms: No symptoms reported    HEENT HEENT Symptoms Reported: Not assessed      Cardiovascular Cardiovascular Symptoms Reported: No symptoms reported    Respiratory Respiratory Symptoms Reported: Shortness of breath Other Respiratory Symptoms: SHOB at baseline    Endocrine Endocrine Symptoms Reported: No symptoms reported Is patient diabetic?: Yes Is patient checking blood sugars at home?: Yes List most recent blood sugar readings, include date and time of day: non fasting 182mg /dL. Endocrine Self-Management Outcome: 3 (uncertain) Endocrine Comment: A1C 7.9% on 09/07/24, slight increase from 7.6% 05/18/24, yet improved from 9.4% on 02/09/24.  Gastrointestinal Gastrointestinal Symptoms Reported: No symptoms reported      Genitourinary Genitourinary Symptoms Reported: No symptoms reported    Integumentary Integumentary Symptoms Reported: No symptoms reported    Musculoskeletal Musculoskelatal Symptoms Reviewed: No symptoms reported        Psychosocial Psychosocial Symptoms Reported: Not assessed          09/17/2024    PHQ2-9 Depression Screening   Little interest or pleasure in doing things    Feeling down, depressed, or hopeless    PHQ-2 - Total Score    Trouble falling or staying asleep, or sleeping too much    Feeling tired or having little energy    Poor appetite or overeating      Feeling bad about yourself - or that you are a failure or have let yourself or your family down    Trouble concentrating on things, such as reading the newspaper or watching television    Moving or speaking so slowly that other people could have noticed.  Or the opposite - being so fidgety or restless that you have been moving around a lot more than usual    Thoughts that you would be better off dead, or hurting yourself in some way    PHQ2-9 Total Score    If you checked off any problems, how difficult have these problems made it for you to do your work, take care of things at home, or get along with other people    Depression Interventions/Treatment      There were no vitals filed for this visit.    Medications Reviewed Today     Reviewed by Lucian Santana LABOR, RN (Registered Nurse) on 09/17/24 at [id number]  Med List Status: <None>   Medication Order Taking? Sig Documenting Provider Last Dose Status Informant  Continuous Glucose Sensor (DEXCOM G7 SENSOR) MISC [id number] Yes 1 Device by Does not apply route as directed. Apply new sensor every 10 days. Nicholas Bar, MD  Active   DULoxetine  (CYMBALTA ) 60 MG capsule [id number] Yes Take 1 capsule (60 mg total) by mouth 2 (two) times daily. Nicholas Bar, MD  Active   feeding supplement, GLUCERNA SHAKE, (GLUCERNA SHAKE) LIQD [id number] Yes Take 237 mLs by mouth 3 (three) times daily between meals. Nicholas Bar, MD  Active   insulin  aspart (  FIASP  FLEXTOUCH) 100 UNIT/ML FlexTouch Pen [id number] Yes Inject 3-5 Units into the skin 3 (three) times daily before meals. McDiarmid, Krystal BIRCH, MD  Active   Insulin  Glargine (BASAGLAR  KWIKPEN) 100 UNIT/ML [id number] Yes Inject 12 Units into the skin daily.  Patient taking differently: Inject 10 Units into the skin daily.   Nicholas Bar, MD  Active   Insulin  Pen Needle (BD PEN NEEDLE NANO U/F) 32G X 4 MM MISC [id number] Yes Use as directed once a day Diona Perkins, MD  Active   loperamide (IMODIUM) 2 MG capsule  [id number] Yes Take 2 mg by mouth daily as needed for diarrhea or loose stools. [provider]  Active            Med Note MAUDIE MAUDE KANDICE Sonjia Jun 07, 2023  3:34 PM) Takes ~4 days weekly  meloxicam (MOBIC) 15 MG tablet [id number] Yes Take 15 mg by mouth daily. [provider]  Active   mupirocin  ointment (BACTROBAN ) 2 % [id number] Yes Apply 1 Application topically 2 (two) times daily. Gershon Donnice SAUNDERS, DPM  Active   polyethylene glycol-electrolytes (NULYTELY) 420 g solution [id number]  do 6 oz every 30 mins until the impaction passes, can do 1/2 gallon on first day and 1/2 gallon on 2nd day.  Can continue 2 fleets enemas a day until it resolves.  Patient not taking: Reported on 09/17/2024   Craig Alan SAUNDERS, PA-C  Active   rosuvastatin  (CRESTOR ) 40 MG tablet [id number] Yes Take 1 tablet (40 mg total) by mouth daily. McDiarmid, Krystal BIRCH, MD  Active   tadalafil (CIALIS) 5 MG tablet [id number] Yes Take 5 mg by mouth daily. [provider]  Active   triamcinolone  ointment (KENALOG ) 0.5 % [id number] Yes Apply 1 Application topically 2 (two) times daily. McDiarmid, Krystal BIRCH, MD  Active             Recommendation:   PCP follow up 10/02/24 Continue Current Plan of Care  Follow Up Plan:   Telephone follow-up three weeks  Santana Stamp BSN, CCM [place]  [facility] Population Health RN Care Manager Direct Dial : [phone]  Fax: [phone]

## 2024-09-17 NOTE — Patient Instructions (Signed)
 Visit Information  [name] was given information about Medicaid Managed Care team care coordination services as a part of their Healthy [facility] Medicaid benefit. Matthew Lynch   If you would like to schedule transportation through your [facility] plan, please call the following number at least 2 days in advance of your appointment: [phone]  For information about your ride after you set it up, call Ride Assist at [phone]. Use this number to activate a Will Call pickup, or if your transportation is late for a scheduled pickup. Use this number, too, if you need to make a change or cancel a previously scheduled reservation.  If you need transportation services right away, call [phone]. The after-hours call center is staffed 24 hours to handle ride assistance and urgent reservation requests (including discharges) 365 days a year. Urgent trips include sick visits, hospital discharge requests and life-sustaining treatment.  Call the [facility] Line at [phone], at any time, 24 hours a day, 7 days a week. If you are in danger or need immediate medical attention call 911.   Care plan and visit instructions communicated with the patient verbally today. Patient agrees to receive a copy in MyChart. Active MyChart status and patient understanding of how to access instructions and care plan via MyChart confirmed with patient.     Telephone follow up appointment with Managed Medicaid care management team member scheduled qnm:[id number], February 5th at 1:00pm.   Matthew Lynch BSN, CCM [place]  [facility] Population Health RN Care Manager Direct Dial : [phone]  Fax: [phone]   Following is a copy of your plan of care:   Goals Addressed             This Visit's Progress    [facility] RN Care Plan- DM II   On track      Problems:  Chronic Disease Management support and education needs related to DMII  Goal: Over the next 30 days the Patient will attend  all scheduled medical appointments: with PCP and specialist as evidenced by keeping al scheduled appointments        demonstrate Ongoing adherence to prescribed treatment plan for DM II as evidenced by no admissions to the hospital verbalize basic understanding of DM II disease process and self health management plan as evidenced by verbal explanation lifestyle changes and consistent medication compliance   Interventions:   Diabetes Interventions: Assessed patient's understanding of A1c goal: <7%  Reviewed medications with patient and discussed importance of medication adherence Counseled on importance of regular laboratory monitoring as prescribed Discussed plans with patient for ongoing care management follow up and provided patient with direct contact information for care management team Review of patient status, including review of consultants reports, relevant laboratory and other test results, and medications completed Take medications as prescribed Continue to eat healthy meals Check blood sugars regularly Patient contains to use his mother for transportation assistance but aware he can call in advance for Ride Assist at [phone] if needed Lab Results  Component Value Date   HGBA1C 7.6 (A) 05/18/2024  PT REPORTS HIS CURRENTLY A1c IS AT 7.8 % TODAY ON DEXCOM.  -Continue to educate pt on improving his A1C utilizing good dietary habits and management of care related to his diabetes UPDATE 09/13/24: Discussed PCP recommendations to decrease long-acting insulin  to 10units daily, he is taking as prescribed, encouraged regular,spaced, portion control  meals throughout the day to keep his blood sugars more even.  Reports today's blood sugar of 182mg /dL (this was not  fasting, post prandial).    Patient Self-Care Activities:  Attend all scheduled provider appointments Call pharmacy for medication refills 3-7 days in advance of running out of medications Call provider office for new  concerns or questions  Perform all self care activities independently  Take medications as prescribed   keep appointment with eye doctor check blood sugar at prescribed times: three times daily(Dexcom)  Plan:  Telephone follow up appointment with care management team member scheduled for:  10/04/2024 @ 1:00 PM

## 2024-09-17 NOTE — Patient Outreach (Signed)
 Complex Care Management   Visit Note  09/17/2024  Name:  Matthew Lynch MRN: [id number] DOB: 05-31-1971  Situation: Referral received for Complex Care Management related to DM I obtained verbal consent from Patient.  Visit completed with Matthew Lynch  on the phone  Background:   Past Medical History:  Diagnosis Date   Anxiety    Depression    Diabetes mellitus without complication (HCC)     Assessment: Patient Reported Symptoms:  Cognitive Cognitive Status: Alert and oriented to person, place, and time, Normal speech and language skills      Neurological Neurological Review of Symptoms: No symptoms reported    HEENT HEENT Symptoms Reported: Not assessed      Cardiovascular Cardiovascular Symptoms Reported: No symptoms reported    Respiratory Respiratory Symptoms Reported: Shortness of breath Other Respiratory Symptoms: SHOB at baseline    Endocrine Endocrine Symptoms Reported: No symptoms reported Is patient diabetic?: Yes Is patient checking blood sugars at home?: Yes List most recent blood sugar readings, include date and time of day: non fasting 182mg /dL. Endocrine Self-Management Outcome: 3 (uncertain) Endocrine Comment: A1C 7.9% on 09/07/24, slight increase from 7.6% 05/18/24, yet improved from 9.4% on 02/09/24.  Gastrointestinal Gastrointestinal Symptoms Reported: No symptoms reported      Genitourinary Genitourinary Symptoms Reported: No symptoms reported    Integumentary Integumentary Symptoms Reported: No symptoms reported    Musculoskeletal Musculoskelatal Symptoms Reviewed: No symptoms reported        Psychosocial Psychosocial Symptoms Reported: Not assessed          09/17/2024    PHQ2-9 Depression Screening   Little interest or pleasure in doing things    Feeling down, depressed, or hopeless    PHQ-2 - Total Score    Trouble falling or staying asleep, or sleeping too much    Feeling tired or having little energy    Poor appetite or overeating      Feeling bad about yourself - or that you are a failure or have let yourself or your family down    Trouble concentrating on things, such as reading the newspaper or watching television    Moving or speaking so slowly that other people could have noticed.  Or the opposite - being so fidgety or restless that you have been moving around a lot more than usual    Thoughts that you would be better off dead, or hurting yourself in some way    PHQ2-9 Total Score    If you checked off any problems, how difficult have these problems made it for you to do your work, take care of things at home, or get along with other people    Depression Interventions/Treatment      There were no vitals filed for this visit.    Medications Reviewed Today   Medications were not reviewed in this encounter     Recommendation:   PCP follow up 10/02/24 Continue Current Plan of Care  Follow Up Plan:   Telephone follow-up three weeks  Santana Stamp BSN, CCM [place]  [facility] Population Health RN Care Manager Direct Dial : [phone]  Fax: [phone]

## 2024-10-02 ENCOUNTER — Ambulatory Visit: Payer: Self-pay | Admitting: Family Medicine

## 2024-10-04 ENCOUNTER — Other Ambulatory Visit: Payer: Self-pay

## 2024-10-05 NOTE — Patient Instructions (Addendum)
 Visit Information  Mr. Seibold was given information about Medicaid Managed Care team care coordination services as a part of their [facility] Medicaid benefit. Zaxton Angerer   If you would like to schedule transportation through your [facility] plan, please call the following number at least 2 days in advance of your appointment: [phone]  For information about your ride after you set it up, call Ride Assist at [phone]. Use this number to activate a Will Call pickup, or if your transportation is late for a scheduled pickup. Use this number, too, if you need to make a change or cancel a previously scheduled reservation.  If you need transportation services right away, call [phone]. The after-hours call center is staffed 24 hours to handle ride assistance and urgent reservation requests (including discharges) 365 days a year. Urgent trips include sick visits, hospital discharge requests and life-sustaining treatment.  Call the [facility] Line at [phone], at any time, 24 hours a day, 7 days a week. If you are in danger or need immediate medical attention call 911.  Care plan and visit instructions communicated with the patient verbally today. Patient agrees to receive a copy in MyChart. Active MyChart status and patient understanding of how to access instructions and care plan via MyChart confirmed with patient.     Telephone follow up appointment with Managed Medicaid care management team member scheduled for: Thursday, March 5th at 1:00pm.  Santana Stamp BSN, CCM [place]  [facility] Population Health RN Care Manager Direct Dial : [phone]  Fax: [phone]   Following is a copy of your plan of care:   Goals Addressed             This Visit's Progress    [facility] RN Care Plan- DM II   On track      Problems:  Chronic Disease Management support and education needs related to DMII  Goal: Over the next 30 days the Patient will attend all  scheduled medical appointments: with PCP and specialist as evidenced by keeping al scheduled appointments        demonstrate Ongoing adherence to prescribed treatment plan for DM II as evidenced by no admissions to the hospital verbalize basic understanding of DM II disease process and self health management plan as evidenced by verbal explanation lifestyle changes and consistent medication compliance   Interventions:   Diabetes Interventions: Assessed patient's understanding of A1c goal: <7%  Reviewed medications with patient and discussed importance of medication adherence Counseled on importance of regular laboratory monitoring as prescribed Discussed plans with patient for ongoing care management follow up and provided patient with direct contact information for care management team Review of patient status, including review of consultants reports, relevant laboratory and other test results, and medications completed Take medications as prescribed Continue to eat healthy meals Check blood sugars regularly Patient contains to use his mother for transportation assistance but aware he can call in advance for Ride Assist at [phone] if needed Lab Results  Component Value Date   HGBA1C 7.6 (A) 05/18/2024  PT REPORTS HIS CURRENTLY A1c IS AT 7.8 % TODAY ON DEXCOM.  -Continue to educate pt on improving his A1C utilizing good dietary habits and management of care related to his diabetes UPDATE 09/13/24: Discussed PCP recommendations to decrease long-acting insulin  to 10units daily, he is taking as prescribed, encouraged regular,spaced, portion control  meals throughout the day to keep his blood sugars more even.  Reports today's blood sugar of 182mg /dL (this was not fasting,  post prandial).   UPDATE 10/04/24: Patient reports his blood sugars have been in target 80% of the time, today's FBG 141mg /dL.  He is aware of blood sugars goals that PCP has set for him.   Patient Self-Care Activities:   Attend all scheduled provider appointments Call pharmacy for medication refills 3-7 days in advance of running out of medications Call provider office for new concerns or questions  Perform all self care activities independently  Take medications as prescribed   keep appointment with eye doctor check blood sugar at prescribed times: three times daily(Dexcom)  Plan:  Telephone follow up appointment with care management team member scheduled for:  10/04/2024 @ 1:00 PM

## 2024-10-05 NOTE — Patient Outreach (Signed)
 Complex Care Management   Visit Note  10/05/2024  Name:  Matthew Lynch MRN: [id number] DOB: 1971/07/18  Situation: Referral received for Complex Care Management related to Diabetes with Complications I obtained verbal consent from Patient.  Visit completed with Matthew Lynch  on the phone  Background:   Past Medical History:  Diagnosis Date   Anxiety    Depression    Diabetes mellitus without complication (HCC)     Assessment: Patient Reported Symptoms:  Cognitive Cognitive Status: Alert and oriented to person, place, and time, Insightful and able to interpret abstract concepts, Normal speech and language skills      Neurological Neurological Review of Symptoms: Other: [place] Neurological Symptoms/Conditions [RPT]: Reports ongoing neuropathy    HEENT HEENT Symptoms Reported: Not assessed      Cardiovascular Cardiovascular Symptoms Reported: No symptoms reported    Respiratory Respiratory Symptoms Reported: Shortness of breath Other Respiratory Symptoms: SHOB at baseline.    Endocrine Endocrine Symptoms Reported: No symptoms reported Is patient diabetic?: Yes Is patient checking blood sugars at home?: Yes List most recent blood sugar readings, include date and time of day: FBG this AM: 141mg /dL Endocrine Comment: Reports 80% in target for the last three days.  A1C 7.9% on 09/07/24, slight increase from 7.6% 05/18/24, yet improved from 9.4% on 02/09/24.  Gastrointestinal Gastrointestinal Symptoms Reported: Not assessed      Genitourinary Genitourinary Symptoms Reported: Not assessed    Integumentary Integumentary Symptoms Reported: No symptoms reported    Musculoskeletal Musculoskelatal Symptoms Reviewed: No symptoms reported        Psychosocial Psychosocial Symptoms Reported: Not assessed          10/05/2024    PHQ2-9 Depression Screening   Little interest or pleasure in doing things    Feeling down, depressed, or hopeless    PHQ-2 - Total Score    Trouble falling or  staying asleep, or sleeping too much    Feeling tired or having little energy    Poor appetite or overeating     Feeling bad about yourself - or that you are a failure or have let yourself or your family down    Trouble concentrating on things, such as reading the newspaper or watching television    Moving or speaking so slowly that other people could have noticed.  Or the opposite - being so fidgety or restless that you have been moving around a lot more than usual    Thoughts that you would be better off dead, or hurting yourself in some way    PHQ2-9 Total Score    If you checked off any problems, how difficult have these problems made it for you to do your work, take care of things at home, or get along with other people    Depression Interventions/Treatment      There were no vitals filed for this visit.    Medications Reviewed Today     Reviewed by Lucian Santana LABOR, RN (Registered Nurse) on 10/05/24 at 1155  Med List Status: <None>   Medication Order Taking? Sig Documenting Provider Last Dose Status Informant  Continuous Glucose Sensor (DEXCOM G7 SENSOR) MISC [id number] Yes 1 Device by Does not apply route as directed. Apply new sensor every 10 days. Nicholas Bar, MD  Active   DULoxetine  (CYMBALTA ) 60 MG capsule [id number] Yes Take 1 capsule (60 mg total) by mouth 2 (two) times daily. Nicholas Bar, MD  Active   feeding supplement, GLUCERNA WYN [facility]) LIQD [id number] Yes Take 237  mLs by mouth 3 (three) times daily between meals. Nicholas Bar, MD  Active   insulin  aspart (FIASP  FLEXTOUCH) 100 UNIT/ML FlexTouch Pen [id number] Yes Inject 3-5 Units into the skin 3 (three) times daily before meals. McDiarmid, Krystal BIRCH, MD  Active   Insulin  Glargine (BASAGLAR  [facility]) 100 UNIT/ML [id number] Yes Inject 12 Units into the skin daily.  Patient taking differently: Inject 10 Units into the skin daily.   Nicholas Bar, MD  Active   Insulin  Pen Needle (BD PEN NEEDLE NANO U/F) 32G X 4  MM MISC [id number] Yes Use as directed once a day Diona Perkins, MD  Active   loperamide (IMODIUM) 2 MG capsule [id number] Yes Take 2 mg by mouth daily as needed for diarrhea or loose stools. [provider]  Active            Med Note MAUDIE MAUDE KANDICE Sonjia Jun 07, 2023  3:34 PM) Takes ~4 days weekly  meloxicam (MOBIC) 15 MG tablet [id number] Yes Take 15 mg by mouth daily. [provider]  Active   mupirocin  ointment (BACTROBAN ) 2 % [id number] Yes Apply 1 Application topically 2 (two) times daily. Gershon Donnice SAUNDERS, DPM  Active   polyethylene glycol-electrolytes (NULYTELY) 420 g solution [id number]  do 6 oz every 30 mins until the impaction passes, can do 1/2 gallon on first day and 1/2 gallon on 2nd day.  Can continue 2 fleets enemas a day until it resolves.  Patient not taking: Reported on 10/05/2024   Craig Alan SAUNDERS, PA-C  Active   rosuvastatin  (CRESTOR ) 40 MG tablet [id number]  Take 1 tablet (40 mg total) by mouth daily. McDiarmid, Krystal BIRCH, MD  Active   tadalafil (CIALIS) 5 MG tablet [id number] Yes Take 5 mg by mouth daily. [provider]  Active   triamcinolone  ointment (KENALOG ) 0.5 % [id number] Yes Apply 1 Application topically 2 (two) times daily. McDiarmid, Krystal BIRCH, MD  Active             Recommendation:   PCP follow up scheduled 10/09/24 Continue Current Plan of Care  Follow Up Plan:   Telephone follow-up in 1 month  Santana Stamp BSN, CCM [place]  [facility] Population Health RN Care Manager Direct Dial : [phone]  Fax: [phone]

## 2024-10-09 ENCOUNTER — Ambulatory Visit: Admitting: Family Medicine

## 2024-11-01 ENCOUNTER — Telehealth
